# Patient Record
Sex: Female | Born: 1975 | Race: Black or African American | Hispanic: No | Marital: Married | Smoking: Never smoker
Health system: Southern US, Community
[De-identification: ages and names within clinical notes are randomized; demographics above are authoritative.]

## PROBLEM LIST (undated history)

## (undated) ENCOUNTER — Inpatient Hospital Stay (HOSPITAL_COMMUNITY): Payer: Self-pay

## (undated) DIAGNOSIS — R002 Palpitations: Secondary | ICD-10-CM

## (undated) DIAGNOSIS — E119 Type 2 diabetes mellitus without complications: Secondary | ICD-10-CM

## (undated) DIAGNOSIS — D649 Anemia, unspecified: Secondary | ICD-10-CM

## (undated) DIAGNOSIS — O24111 Pre-existing diabetes mellitus, type 2, in pregnancy, first trimester: Secondary | ICD-10-CM

## (undated) DIAGNOSIS — O139 Gestational [pregnancy-induced] hypertension without significant proteinuria, unspecified trimester: Secondary | ICD-10-CM

## (undated) DIAGNOSIS — O24419 Gestational diabetes mellitus in pregnancy, unspecified control: Secondary | ICD-10-CM

## (undated) DIAGNOSIS — E079 Disorder of thyroid, unspecified: Secondary | ICD-10-CM

## (undated) DIAGNOSIS — I10 Essential (primary) hypertension: Secondary | ICD-10-CM

## (undated) HISTORY — DX: Essential (primary) hypertension: I10

## (undated) HISTORY — DX: Palpitations: R00.2

## (undated) HISTORY — DX: Anemia, unspecified: D64.9

## (undated) HISTORY — PX: THYROIDECTOMY: SHX17

## (undated) HISTORY — PX: APPENDECTOMY: SHX54

---
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
## 2010-02-22 ENCOUNTER — Ambulatory Visit (HOSPITAL_COMMUNITY): Admission: RE | Admit: 2010-02-22 | Discharge: 2010-02-22 | Payer: Self-pay | Admitting: Obstetrics

## 2010-03-19 ENCOUNTER — Encounter: Payer: Self-pay | Admitting: Obstetrics

## 2010-03-28 ENCOUNTER — Ambulatory Visit (HOSPITAL_COMMUNITY): Admission: RE | Admit: 2010-03-28 | Discharge: 2010-03-28 | Payer: Self-pay | Admitting: Obstetrics

## 2010-04-14 ENCOUNTER — Inpatient Hospital Stay (HOSPITAL_COMMUNITY): Admission: AD | Admit: 2010-04-14 | Discharge: 2010-04-15 | Payer: Self-pay | Admitting: Obstetrics

## 2010-04-14 ENCOUNTER — Ambulatory Visit: Payer: Self-pay | Admitting: Obstetrics and Gynecology

## 2010-04-18 ENCOUNTER — Ambulatory Visit (HOSPITAL_COMMUNITY): Admission: RE | Admit: 2010-04-18 | Discharge: 2010-04-18 | Payer: Self-pay | Admitting: Obstetrics

## 2010-04-19 ENCOUNTER — Ambulatory Visit (HOSPITAL_COMMUNITY): Admission: RE | Admit: 2010-04-19 | Discharge: 2010-04-19 | Payer: Self-pay | Admitting: Obstetrics

## 2010-04-25 ENCOUNTER — Ambulatory Visit (HOSPITAL_COMMUNITY): Admission: RE | Admit: 2010-04-25 | Discharge: 2010-04-25 | Payer: Self-pay | Admitting: Obstetrics

## 2010-04-26 ENCOUNTER — Ambulatory Visit: Payer: Self-pay | Admitting: Obstetrics and Gynecology

## 2010-04-26 ENCOUNTER — Inpatient Hospital Stay (HOSPITAL_COMMUNITY): Admission: AD | Admit: 2010-04-26 | Discharge: 2010-04-26 | Payer: Self-pay | Admitting: Obstetrics

## 2010-04-29 ENCOUNTER — Ambulatory Visit (HOSPITAL_COMMUNITY): Admission: RE | Admit: 2010-04-29 | Discharge: 2010-04-29 | Payer: Self-pay | Admitting: Obstetrics

## 2010-05-02 ENCOUNTER — Ambulatory Visit (HOSPITAL_COMMUNITY): Admission: RE | Admit: 2010-05-02 | Discharge: 2010-05-02 | Payer: Self-pay | Admitting: Obstetrics

## 2010-05-06 ENCOUNTER — Ambulatory Visit: Payer: Self-pay | Admitting: Nurse Practitioner

## 2010-05-06 ENCOUNTER — Encounter: Payer: Self-pay | Admitting: Obstetrics

## 2010-05-06 ENCOUNTER — Inpatient Hospital Stay (HOSPITAL_COMMUNITY): Admission: AD | Admit: 2010-05-06 | Discharge: 2010-05-06 | Payer: Self-pay | Admitting: Obstetrics

## 2010-05-09 ENCOUNTER — Ambulatory Visit (HOSPITAL_COMMUNITY): Admission: RE | Admit: 2010-05-09 | Discharge: 2010-05-09 | Payer: Self-pay | Admitting: Obstetrics

## 2010-05-13 ENCOUNTER — Ambulatory Visit (HOSPITAL_COMMUNITY): Admission: RE | Admit: 2010-05-13 | Discharge: 2010-05-13 | Payer: Self-pay | Admitting: Obstetrics

## 2010-05-16 ENCOUNTER — Ambulatory Visit (HOSPITAL_COMMUNITY): Admission: RE | Admit: 2010-05-16 | Discharge: 2010-05-16 | Payer: Self-pay | Admitting: Obstetrics

## 2010-05-20 ENCOUNTER — Ambulatory Visit (HOSPITAL_COMMUNITY): Admission: RE | Admit: 2010-05-20 | Discharge: 2010-05-20 | Payer: Self-pay | Admitting: Obstetrics

## 2010-05-23 ENCOUNTER — Inpatient Hospital Stay (HOSPITAL_COMMUNITY): Admission: AD | Admit: 2010-05-23 | Discharge: 2010-05-23 | Payer: Self-pay | Admitting: Obstetrics

## 2010-05-23 ENCOUNTER — Ambulatory Visit (HOSPITAL_COMMUNITY): Admission: RE | Admit: 2010-05-23 | Discharge: 2010-05-23 | Payer: Self-pay | Admitting: Obstetrics

## 2010-05-23 ENCOUNTER — Ambulatory Visit: Payer: Self-pay | Admitting: Obstetrics and Gynecology

## 2010-05-26 ENCOUNTER — Inpatient Hospital Stay (HOSPITAL_COMMUNITY): Admission: AD | Admit: 2010-05-26 | Discharge: 2010-05-26 | Payer: Self-pay | Admitting: Obstetrics

## 2010-05-26 ENCOUNTER — Ambulatory Visit: Payer: Self-pay | Admitting: Family

## 2010-05-27 ENCOUNTER — Ambulatory Visit (HOSPITAL_COMMUNITY): Admission: RE | Admit: 2010-05-27 | Discharge: 2010-05-27 | Payer: Self-pay | Admitting: Obstetrics

## 2010-05-30 ENCOUNTER — Ambulatory Visit (HOSPITAL_COMMUNITY): Admission: RE | Admit: 2010-05-30 | Discharge: 2010-05-30 | Payer: Self-pay | Admitting: Obstetrics

## 2010-06-03 ENCOUNTER — Encounter: Payer: Self-pay | Admitting: Obstetrics

## 2010-06-03 ENCOUNTER — Inpatient Hospital Stay (HOSPITAL_COMMUNITY): Admission: AD | Admit: 2010-06-03 | Discharge: 2010-06-04 | Payer: Self-pay | Admitting: Obstetrics

## 2010-06-06 ENCOUNTER — Inpatient Hospital Stay (HOSPITAL_COMMUNITY): Admission: AD | Admit: 2010-06-06 | Discharge: 2010-06-09 | Payer: Self-pay | Admitting: Obstetrics

## 2010-06-06 ENCOUNTER — Encounter: Payer: Self-pay | Admitting: Obstetrics

## 2010-06-27 ENCOUNTER — Inpatient Hospital Stay (HOSPITAL_COMMUNITY): Admission: AD | Admit: 2010-06-27 | Discharge: 2010-06-27 | Payer: Self-pay | Admitting: Obstetrics

## 2010-06-27 ENCOUNTER — Ambulatory Visit: Payer: Self-pay | Admitting: Obstetrics and Gynecology

## 2010-09-26 ENCOUNTER — Inpatient Hospital Stay (HOSPITAL_COMMUNITY): Admission: AD | Admit: 2010-09-26 | Discharge: 2010-03-20 | Payer: Self-pay | Admitting: Obstetrics

## 2010-11-10 ENCOUNTER — Encounter: Payer: Self-pay | Admitting: Obstetrics

## 2011-01-03 LAB — COMPREHENSIVE METABOLIC PANEL
ALT: 11 U/L (ref 0–35)
ALT: 12 U/L (ref 0–35)
Albumin: 2.8 g/dL — ABNORMAL LOW (ref 3.5–5.2)
Alkaline Phosphatase: 197 U/L — ABNORMAL HIGH (ref 39–117)
Alkaline Phosphatase: 207 U/L — ABNORMAL HIGH (ref 39–117)
BUN: 2 mg/dL — ABNORMAL LOW (ref 6–23)
CO2: 23 mEq/L (ref 19–32)
Calcium: 10.2 mg/dL (ref 8.4–10.5)
Calcium: 10.3 mg/dL (ref 8.4–10.5)
Chloride: 105 mEq/L (ref 96–112)
Creatinine, Ser: 0.47 mg/dL (ref 0.4–1.2)
Creatinine, Ser: 0.43 mg/dL (ref 0.4–1.2)
GFR calc Af Amer: >60 mL/min (ref >60)
GFR calc non Af Amer: >60 mL/min (ref >60)
GFR calc non Af Amer: >60 mL/min (ref >60)
Sodium: 133 mEq/L — ABNORMAL LOW (ref 135–145)
Total Bilirubin: 0.7 mg/dL (ref 0.3–1.2)
Total Protein: 6.1 g/dL (ref 6.0–8.3)
Total Protein: 6.3 g/dL (ref 6.0–8.3)
Total Protein: 6.3 g/dL (ref 6.0–8.3)

## 2011-01-03 LAB — CBC
HCT: 28.3 % — ABNORMAL LOW (ref 36.0–46.0)
HCT: 34.7 % — ABNORMAL LOW (ref 36.0–46.0)
Hemoglobin: 10.8 g/dL — ABNORMAL LOW (ref 12.0–15.0)
Hemoglobin: 10.8 g/dL — ABNORMAL LOW (ref 12.0–15.0)
Hemoglobin: 8.9 g/dL — ABNORMAL LOW (ref 12.0–15.0)
MCH: 22.6 pg — ABNORMAL LOW (ref 26.0–34.0)
MCH: 23 pg — ABNORMAL LOW (ref 26.0–34.0)
MCH: 22.8 pg — ABNORMAL LOW (ref 26.0–34.0)
MCHC: 30.6 g/dL (ref 30.0–36.0)
MCHC: 31.4 g/dL (ref 30.0–36.0)
MCV: 73.7 fL — ABNORMAL LOW (ref 78.0–100.0)
MCV: 73 fL — ABNORMAL LOW (ref 78.0–100.0)
Platelets: 139 10*3/uL — ABNORMAL LOW (ref 150–400)
Platelets: 103 10*3/uL — ABNORMAL LOW (ref 150–400)
RBC: 3.86 MIL/uL — ABNORMAL LOW (ref 3.87–5.11)
RBC: 4.77 MIL/uL (ref 3.87–5.11)
RDW: 17.4 % — ABNORMAL HIGH (ref 11.5–15.5)
RDW: 17.4 % — ABNORMAL HIGH (ref 11.5–15.5)
WBC: 8.1 10*3/uL (ref 4.0–10.5)

## 2011-01-03 LAB — URINALYSIS, ROUTINE W REFLEX MICROSCOPIC
Bilirubin Urine: NEGATIVE
Bilirubin Urine: NEGATIVE
Glucose, UA: 100 mg/dL — AB
Hgb urine dipstick: NEGATIVE
Hgb urine dipstick: NEGATIVE
Nitrite: NEGATIVE
Protein, ur: NEGATIVE mg/dL
Urobilinogen, UA: 0.2 mg/dL (ref 0.0–1.0)
Urobilinogen, UA: 0.2 mg/dL (ref 0.0–1.0)
pH: 6 (ref 5.0–8.0)

## 2011-01-03 LAB — GLUCOSE, CAPILLARY
Glucose-Capillary: 105 mg/dL — ABNORMAL HIGH (ref 70–99)
Glucose-Capillary: 121 mg/dL — ABNORMAL HIGH (ref 70–99)
Glucose-Capillary: 121 mg/dL — ABNORMAL HIGH (ref 70–99)
Glucose-Capillary: 151 mg/dL — ABNORMAL HIGH (ref 70–99)

## 2011-01-03 LAB — PROTEIN, URINE, 24 HOUR
Collection Interval-UPROT: 24 hours
Protein, 24H Urine: 222 mg/d — ABNORMAL HIGH (ref 50–100)
Urine Total Volume-UPROT: 3700 mL

## 2011-01-03 LAB — CREATININE CLEARANCE, URINE, 24 HOUR
Collection Interval-CRCL: 24 hours
Creatinine Clearance: 220 mL/min — ABNORMAL HIGH (ref 75–115)
Creatinine: 0.47 mg/dL (ref 0.4–1.2)
Urine Total Volume-CRCL: 3700 mL

## 2011-01-03 LAB — URIC ACID: Uric Acid, Serum: 4.2 mg/dL (ref 2.4–7.0)

## 2011-01-04 LAB — URINALYSIS, ROUTINE W REFLEX MICROSCOPIC
Ketones, ur: 15 mg/dL — AB
Nitrite: NEGATIVE
Specific Gravity, Urine: >1.03 — ABNORMAL HIGH (ref 1.005–1.030)
pH: 6 (ref 5.0–8.0)

## 2011-01-05 LAB — URINALYSIS, ROUTINE W REFLEX MICROSCOPIC
Bilirubin Urine: NEGATIVE
Glucose, UA: 250 mg/dL — AB
Nitrite: NEGATIVE
Nitrite: NEGATIVE
Protein, ur: NEGATIVE mg/dL
Specific Gravity, Urine: >1.03 — ABNORMAL HIGH (ref 1.005–1.030)
Specific Gravity, Urine: >1.03 — ABNORMAL HIGH (ref 1.005–1.030)
Urobilinogen, UA: 1 mg/dL (ref 0.0–1.0)
pH: 6 (ref 5.0–8.0)

## 2011-01-05 LAB — COMPREHENSIVE METABOLIC PANEL
ALT: 11 U/L (ref 0–35)
AST: 15 U/L (ref 0–37)
AST: 15 U/L (ref 0–37)
Albumin: 2.9 g/dL — ABNORMAL LOW (ref 3.5–5.2)
Alkaline Phosphatase: 122 U/L — ABNORMAL HIGH (ref 39–117)
CO2: 23 mEq/L (ref 19–32)
Calcium: 10.4 mg/dL (ref 8.4–10.5)
Calcium: 10.6 mg/dL — ABNORMAL HIGH (ref 8.4–10.5)
Chloride: 105 mEq/L (ref 96–112)
Creatinine, Ser: 0.39 mg/dL — ABNORMAL LOW (ref 0.4–1.2)
GFR calc Af Amer: >60 mL/min (ref >60)
GFR calc Af Amer: >60 mL/min (ref >60)
GFR calc non Af Amer: >60 mL/min (ref >60)
Glucose, Bld: 99 mg/dL (ref 70–99)
Potassium: 3.5 mEq/L (ref 3.5–5.1)
Sodium: 134 mEq/L — ABNORMAL LOW (ref 135–145)
Total Bilirubin: 0.7 mg/dL (ref 0.3–1.2)
Total Protein: 6.3 g/dL (ref 6.0–8.3)
Total Protein: 6.5 g/dL (ref 6.0–8.3)

## 2011-01-05 LAB — URIC ACID: Uric Acid, Serum: 4 mg/dL (ref 2.4–7.0)

## 2011-01-05 LAB — CBC
HCT: 34 % — ABNORMAL LOW (ref 36.0–46.0)
Hemoglobin: 10.5 g/dL — ABNORMAL LOW (ref 12.0–15.0)
MCH: 22.8 pg — ABNORMAL LOW (ref 26.0–34.0)
Platelets: 102 10*3/uL — ABNORMAL LOW (ref 150–400)
RBC: 4.51 MIL/uL (ref 3.87–5.11)
RDW: 19 % — ABNORMAL HIGH (ref 11.5–15.5)
WBC: 8.9 10*3/uL (ref 4.0–10.5)

## 2011-01-05 LAB — LACTATE DEHYDROGENASE: LDH: 123 U/L (ref 94–250)

## 2011-01-06 LAB — COMPREHENSIVE METABOLIC PANEL
ALT: 13 U/L (ref 0–35)
Alkaline Phosphatase: 78 U/L (ref 39–117)
BUN: 2 mg/dL — ABNORMAL LOW (ref 6–23)
BUN: 4 mg/dL — ABNORMAL LOW (ref 6–23)
CO2: 23 mEq/L (ref 19–32)
CO2: 24 mEq/L (ref 19–32)
Calcium: 10.1 mg/dL (ref 8.4–10.5)
Chloride: 108 mEq/L (ref 96–112)
Creatinine, Ser: 0.41 mg/dL (ref 0.4–1.2)
GFR calc non Af Amer: >60 mL/min (ref >60)
Glucose, Bld: 105 mg/dL — ABNORMAL HIGH (ref 70–99)
Glucose, Bld: 148 mg/dL — ABNORMAL HIGH (ref 70–99)
Potassium: 3.3 mEq/L — ABNORMAL LOW (ref 3.5–5.1)
Total Bilirubin: 0.3 mg/dL (ref 0.3–1.2)
Total Protein: 6.6 g/dL (ref 6.0–8.3)

## 2011-01-06 LAB — CBC
HCT: 30.9 % — ABNORMAL LOW (ref 36.0–46.0)
HCT: 34.4 % — ABNORMAL LOW (ref 36.0–46.0)
Hemoglobin: 10.8 g/dL — ABNORMAL LOW (ref 12.0–15.0)
Hemoglobin: 9.9 g/dL — ABNORMAL LOW (ref 12.0–15.0)
MCHC: 31.4 g/dL (ref 30.0–36.0)
MCV: 68.5 fL — ABNORMAL LOW (ref 78.0–100.0)
RBC: 5.03 MIL/uL (ref 3.87–5.11)
RDW: 23.6 % — ABNORMAL HIGH (ref 11.5–15.5)
WBC: 7.4 10*3/uL (ref 4.0–10.5)

## 2011-01-06 LAB — URINALYSIS, ROUTINE W REFLEX MICROSCOPIC
Bilirubin Urine: NEGATIVE
Hgb urine dipstick: NEGATIVE
Ketones, ur: NEGATIVE mg/dL
Specific Gravity, Urine: 1.025 (ref 1.005–1.030)
pH: 5.5 (ref 5.0–8.0)

## 2011-01-06 LAB — PROTEIN, URINE, 24 HOUR
Protein, 24H Urine: 189 mg/d — ABNORMAL HIGH (ref 50–100)
Urine Total Volume-UPROT: 2700 mL

## 2011-01-06 LAB — URIC ACID
Uric Acid, Serum: 3.4 mg/dL (ref 2.4–7.0)
Uric Acid, Serum: 3.4 mg/dL (ref 2.4–7.0)

## 2011-01-06 LAB — CREATININE CLEARANCE, URINE, 24 HOUR
Collection Interval-CRCL: 24 hours
Creatinine Clearance: 158 mL/min — ABNORMAL HIGH (ref 75–115)
Creatinine, 24H Ur: 1161 mg/d (ref 700–1800)
Creatinine, Urine: 43 mg/dL

## 2011-01-06 LAB — LACTATE DEHYDROGENASE
LDH: 85 U/L — ABNORMAL LOW (ref 94–250)
LDH: 99 U/L (ref 94–250)

## 2011-07-07 ENCOUNTER — Encounter (HOSPITAL_COMMUNITY): Payer: Self-pay | Admitting: *Deleted

## 2011-07-07 ENCOUNTER — Inpatient Hospital Stay (HOSPITAL_COMMUNITY)
Admission: AD | Admit: 2011-07-07 | Discharge: 2011-07-07 | Disposition: A | Discharge status: Home / Self Care | Payer: Self-pay | Source: Ambulatory Visit | Attending: Obstetrics & Gynecology | Admitting: Obstetrics & Gynecology

## 2011-07-07 ENCOUNTER — Inpatient Hospital Stay (HOSPITAL_COMMUNITY): Payer: Self-pay

## 2011-07-07 DIAGNOSIS — O209 Hemorrhage in early pregnancy, unspecified: Secondary | ICD-10-CM | POA: Insufficient documentation

## 2011-07-07 DIAGNOSIS — O200 Threatened abortion: Secondary | ICD-10-CM

## 2011-07-07 HISTORY — DX: Gestational (pregnancy-induced) hypertension without significant proteinuria, unspecified trimester: O13.9

## 2011-07-07 HISTORY — DX: Gestational diabetes mellitus in pregnancy, unspecified control: O24.419

## 2011-07-07 LAB — URINE MICROSCOPIC-ADD ON

## 2011-07-07 LAB — POCT PREGNANCY, URINE: Preg Test, Ur: POSITIVE

## 2011-07-07 LAB — URINALYSIS, ROUTINE W REFLEX MICROSCOPIC
Bilirubin Urine: NEGATIVE
Ketones, ur: NEGATIVE mg/dL
Nitrite: NEGATIVE
Urobilinogen, UA: 0.2 mg/dL (ref 0.0–1.0)

## 2011-07-07 NOTE — Progress Notes (Signed)
Pt reports last period 05/15/11 had positive HPT in august. Started having back pain and vaginal bleeding. Has not started prenatal care.

## 2011-07-07 NOTE — ED Provider Notes (Signed)
Chief Complaint:  Threatened Miscarriage   Kylie Baker is  35 y.o. G2P1001 at Unknown presents complaining of Threatened Miscarriage . Pt states she began having moderate vaginal bleeding and lower back pain about 3 hours ago.  Last intercourse 2 days ago.  Has not started [facility] and has not had an u/s this pregnancy Obstetrical/Gynecological History: Menstrual History: OB History    Grav Para Term Preterm Abortions TAB SAB Ect Mult Living   2 1 1  0 0 0 0 0 0 1     Patient's last menstrual period was 05/16/2011.     Past Medical History: Past Medical History  Diagnosis Date  . Gestational diabetes   . Pregnancy induced hypertension   . Asthma     Past Surgical History: Past Surgical History  Procedure Date  . Cesarean section   . Appendectomy     Family History: No family history on file.  Social History: History  Substance Use Topics  . Smoking status: Never Smoker   . Smokeless tobacco: Never Used  . Alcohol Use: No    Allergies:  Allergies  Allergen Reactions  . Aspirin Itching    Meds:  Prescriptions prior to admission  Medication Sig Dispense Refill  . amLODipine (NORVASC) 10 MG tablet Take 10 mg by mouth daily.        . prenatal vitamin w/FE, FA (PRENATAL 1 + 1) 27-1 MG TABS Take 1 tablet by mouth daily.          Review of Systems - Please refer to the aforementioned patients' reports.    Physical Exam  Blood pressure 135/96, pulse 85, temperature 98.9 F (37.2 C), temperature source Oral, resp. rate 18, height 5\' 2"  (1.575 m), weight 239 lb 9.6 oz (108.682 kg), last menstrual period 05/16/2011, unknown if currently breastfeeding. GENERAL: Well-developed, well-nourished female in no acute distress.  LUNGS: Clear to auscultation bilaterally.  HEART: Regular rate and rhythm. ABDOMEN: Soft, nontender, nondistended, gravid.  EXTREMITIES: Nontender, no edema, 2+ distal pulses. PELVIC:  SSE;  Moderate amount of bright red blood coming from cervical  os.  Cx non-friable, closed  Labs: No results found for this or any previous visit (from the past 24 hour(s)). Imaging Studies:  No results found.  Assessment: Kylie Baker is  35 y.o. G2P1001 at Unknown presents with vaginal bleeding/back pain  Plan: [facility]; ultrasound  CRESENZO-DISHMAN,Daily Crate 9/17/20123:28 PM

## 2011-07-07 NOTE — ED Notes (Signed)
Drenda Freeze CNM in with pt, discussing [place] results and plan.

## 2011-07-07 NOTE — Progress Notes (Signed)
Started bleeding around 1200, back pain started prior to bleeding.  +HPT last week.

## 2011-07-07 NOTE — ED Provider Notes (Signed)
Pt's HCG is 117, u/s showed 4.3 week GS, no fetal pole.  Pt informed that this could represent a SAB or an early IUP.  QHCG ordered for 48 hours.

## 2011-07-11 NOTE — ED Provider Notes (Signed)
Agree with above note.  Anaisa Radi H. 07/11/2011 8:25 AM  

## 2011-07-11 NOTE — ED Provider Notes (Signed)
Agree with above note.  LEGGETT,KELLY H. 07/11/2011 8:25 AM

## 2011-09-12 ENCOUNTER — Inpatient Hospital Stay (HOSPITAL_COMMUNITY): Payer: Self-pay

## 2011-09-12 ENCOUNTER — Encounter (HOSPITAL_COMMUNITY): Payer: Self-pay

## 2011-09-12 ENCOUNTER — Inpatient Hospital Stay (HOSPITAL_COMMUNITY)
Admission: AD | Admit: 2011-09-12 | Discharge: 2011-09-12 | Disposition: A | Discharge status: Home / Self Care | Payer: Self-pay | Source: Ambulatory Visit | Attending: Obstetrics & Gynecology | Admitting: Obstetrics & Gynecology

## 2011-09-12 DIAGNOSIS — I10 Essential (primary) hypertension: Secondary | ICD-10-CM

## 2011-09-12 DIAGNOSIS — O4690 Antepartum hemorrhage, unspecified, unspecified trimester: Secondary | ICD-10-CM

## 2011-09-12 DIAGNOSIS — O209 Hemorrhage in early pregnancy, unspecified: Secondary | ICD-10-CM | POA: Insufficient documentation

## 2011-09-12 DIAGNOSIS — O219 Vomiting of pregnancy, unspecified: Secondary | ICD-10-CM

## 2011-09-12 DIAGNOSIS — O10019 Pre-existing essential hypertension complicating pregnancy, unspecified trimester: Secondary | ICD-10-CM | POA: Insufficient documentation

## 2011-09-12 DIAGNOSIS — O210 Mild hyperemesis gravidarum: Secondary | ICD-10-CM | POA: Insufficient documentation

## 2011-09-12 LAB — URINALYSIS, ROUTINE W REFLEX MICROSCOPIC
Ketones, ur: NEGATIVE mg/dL
Leukocytes, UA: NEGATIVE
Nitrite: NEGATIVE
Specific Gravity, Urine: 1.025 (ref 1.005–1.030)
Urobilinogen, UA: 0.2 mg/dL (ref 0.0–1.0)
pH: 6 (ref 5.0–8.0)

## 2011-09-12 MED ORDER — ONDANSETRON 8 MG PO TBDP: 8.0000 mg | ORAL_TABLET | Freq: Three times a day (TID) | ORAL | Status: AC | PRN

## 2011-09-12 MED ORDER — LACTATED RINGERS IV BOLUS (SEPSIS)
1000.0000 mL | Freq: Once | INTRAVENOUS | Status: AC
  Administered 2011-09-12: 1000 mL via INTRAVENOUS

## 2011-09-12 MED ORDER — ONDANSETRON 8 MG/NS 50 ML IVPB
8.0000 mg | Freq: Once | INTRAVENOUS | Status: AC
  Administered 2011-09-12: 8 mg via INTRAVENOUS
  Filled 2011-09-12: qty 8

## 2011-09-12 MED ORDER — LABETALOL HCL 200 MG PO TABS: 100.0000 mg | ORAL_TABLET | Freq: Two times a day (BID) | ORAL | Status: DC

## 2011-09-12 NOTE — Progress Notes (Signed)
Patient states that she has had nausea and vomiting for a while, worse for the past 2 days. Has some lower abdominal cramping off and on. Feels weak. Patient states she had a SAB on 9-17 and did not have a period.

## 2011-09-12 NOTE — ED Provider Notes (Signed)
History   Kylie Baker is a 35 y.o. year old G43P1011 female at Unknown weeks gestation who presents to MAU reporting not being able to keep anything x 2 days. She was pregnancy in September 2012, [place] 07/07/11 showed 4.3 week GS, no YS or FP. Quant 117. She reports bleeding x 1 week. Assumed SAB. No period since then.   Chief Complaint  Patient presents with  . Emesis   HPI  OB History    Grav Para Term Preterm Abortions TAB SAB Ect Mult Living   3 1 1  0 1 0 1 0 0 1      Past Medical History  Diagnosis Date  . Pregnancy induced hypertension   . Asthma   . Gestational diabetes     Past Surgical History  Procedure Date  . Cesarean section   . Appendectomy     No family history on file.  History  Substance Use Topics  . Smoking status: Never Smoker   . Smokeless tobacco: Never Used  . Alcohol Use: No    Allergies:  Allergies  Allergen Reactions  . Aspirin Itching    Prescriptions prior to admission  Medication Sig Dispense Refill  . prenatal vitamin w/FE, FA (PRENATAL 1 + 1) 27-1 MG TABS Take 1 tablet by mouth daily.         ROS: Otherwise neg Physical Exam   Blood pressure 155/84, pulse 83, temperature 99.2 F (37.3 C), temperature source Oral, resp. rate 20, height 5' 3.5" (1.613 m), weight 112.492 kg (248 lb), last menstrual period 05/16/2011, SpO2 98.00%, unknown if currently breastfeeding.  Physical Exam  MAU Course  Procedures  Ultrasound - Single living intrauterine gestation with estimated gestational age  of [redacted] weeks 4 days by this ultrasound.  No evidence of subchorionic hemorrhage.  Right ovary not visualized.  BHCG - [id number] Blood Type - B+ UA - negative  Assessment and Plan  Care turned over to [facility], [place] at 2000.  SMITH, [place] 09/12/2011, 8:00 PM   A:  Hyperemesis in pregnancy Bleeding in pregnancy Hypertension  Plan: DC to home Rx Zofran Labetalol 100mg  BID Keep scheduled appointment with  [facility]  DC to

## 2011-09-12 NOTE — Progress Notes (Signed)
Pt reports improvement in symptoms after receiving IV and meds.

## 2011-10-16 ENCOUNTER — Inpatient Hospital Stay (HOSPITAL_COMMUNITY)
Admission: AD | Admit: 2011-10-16 | Discharge: 2011-10-16 | Disposition: A | Discharge status: Home / Self Care | Payer: Self-pay | Source: Ambulatory Visit | Attending: Obstetrics | Admitting: Obstetrics

## 2011-10-16 ENCOUNTER — Encounter (HOSPITAL_COMMUNITY): Payer: Self-pay

## 2011-10-16 DIAGNOSIS — B9789 Other viral agents as the cause of diseases classified elsewhere: Secondary | ICD-10-CM | POA: Insufficient documentation

## 2011-10-16 DIAGNOSIS — B349 Viral infection, unspecified: Secondary | ICD-10-CM

## 2011-10-16 DIAGNOSIS — O99891 Other specified diseases and conditions complicating pregnancy: Secondary | ICD-10-CM | POA: Insufficient documentation

## 2011-10-16 LAB — URINALYSIS, ROUTINE W REFLEX MICROSCOPIC
Nitrite: NEGATIVE
Specific Gravity, Urine: >1.03 — ABNORMAL HIGH (ref 1.005–1.030)
Urobilinogen, UA: 0.2 mg/dL (ref 0.0–1.0)
pH: 6 (ref 5.0–8.0)

## 2011-10-16 MED ORDER — OSELTAMIVIR PHOSPHATE 75 MG PO CAPS: 75.0000 mg | ORAL_CAPSULE | Freq: Two times a day (BID) | ORAL | Status: AC

## 2011-10-16 NOTE — ED Provider Notes (Signed)
History     Chief Complaint  Patient presents with  . Influenza   HPI  Pt is here with report of body aches and shortness of breath that started on 10/13/11.  Also, fever (102)  and chills.  Denies nausea or vomiting.  No known exposure to ill individuals.    Past Medical History  Diagnosis Date  . Pregnancy induced hypertension   . Asthma   . Gestational diabetes     Past Surgical History  Procedure Date  . Cesarean section   . Appendectomy     No family history on file.  History  Substance Use Topics  . Smoking status: Never Smoker   . Smokeless tobacco: Never Used  . Alcohol Use: No    Allergies:  Allergies  Allergen Reactions  . Aspirin Itching    Prescriptions prior to admission  Medication Sig Dispense Refill  . labetalol (NORMODYNE) 200 MG tablet Take 0.5 tablets (100 mg total) by mouth 2 (two) times daily.  60 tablet  1  . Prenatal Vit-Fe Fumarate-FA (PRENATAL MULTIVITAMIN) TABS Take 1 tablet by mouth daily.          Review of Systems  Constitutional: Positive for fever, chills and malaise/fatigue.  HENT: Positive for ear pain, congestion and sore throat.   Eyes: Negative.   Respiratory: Positive for cough, sputum production and shortness of breath. Negative for wheezing.   Cardiovascular: Negative.   Gastrointestinal: Negative for nausea, vomiting and diarrhea.  Genitourinary: Negative.   Musculoskeletal: Positive for myalgias.  Neurological: Positive for headaches.   Physical Exam   Blood pressure 148/86, pulse 83, temperature 99.7 F (37.6 C), temperature source Oral, resp. rate 20, height 5\' 4"  (1.626 m), weight 109.952 kg (242 lb 6.4 oz), SpO2 98.00%, unknown if currently breastfeeding.  Physical Exam  Constitutional: She is oriented to person, place, and time. She appears well-developed and well-nourished. No distress.  HENT:  Head: Normocephalic.       Throat - reddened  Neck: Normal range of motion. Neck supple.  Cardiovascular:  Normal rate, regular rhythm and normal heart sounds.   Respiratory: Effort normal and breath sounds normal. No respiratory distress. She has no wheezes. She has no rales.       Pulse ox - 99%  GI: Soft. There is no tenderness.       Fetal heart rate + (ultrasound)  Genitourinary: No bleeding around the vagina.  Neurological: She is alert and oriented to person, place, and time.  Skin: Skin is warm and dry.    MAU Course  Procedures Flu screen  Consulted with Dr. Shari Heritage with plan of care Assessment and Plan  Viral Syndrome  Plan: DC to home Tamiflu Increase fluids Tylenol for pain Keep scheduled appointment.   [facility] 10/16/2011, 1:02 PM

## 2011-10-16 NOTE — Progress Notes (Signed)
Patient states she started getting sick on 12-24. Has had nasal congestion, runny nose, fever up to 102 this am that responds for a short time to Tylenol, sore throat, headache and back pain.

## 2011-10-16 NOTE — ED Notes (Signed)
C/o flu symptoms for past 4 days; 1st prenatal visit with Dr Gaynell Face  Is 10/27/11; denies anyone else being sick; unable to doppler FHR;

## 2011-10-16 NOTE — ED Notes (Signed)
States that she has to leave and she left 2 phone numbers to contact her;

## 2011-11-10 LAB — OB RESULTS CONSOLE ABO/RH: RH Type: POSITIVE

## 2011-11-10 LAB — OB RESULTS CONSOLE RPR: RPR: NONREACTIVE

## 2011-11-10 LAB — OB RESULTS CONSOLE HEPATITIS B SURFACE ANTIGEN: Hepatitis B Surface Ag: NEGATIVE

## 2011-11-10 LAB — OB RESULTS CONSOLE ANTIBODY SCREEN: Antibody Screen: NEGATIVE

## 2011-11-18 ENCOUNTER — Inpatient Hospital Stay (HOSPITAL_COMMUNITY)
Admission: AD | Admit: 2011-11-18 | Discharge: 2011-11-18 | Disposition: A | Discharge status: Home / Self Care | Payer: Self-pay | Source: Ambulatory Visit | Attending: Obstetrics | Admitting: Obstetrics

## 2011-11-18 ENCOUNTER — Inpatient Hospital Stay (HOSPITAL_COMMUNITY): Payer: Self-pay

## 2011-11-18 ENCOUNTER — Encounter (HOSPITAL_COMMUNITY): Payer: Self-pay | Admitting: *Deleted

## 2011-11-18 DIAGNOSIS — M549 Dorsalgia, unspecified: Secondary | ICD-10-CM | POA: Insufficient documentation

## 2011-11-18 DIAGNOSIS — N949 Unspecified condition associated with female genital organs and menstrual cycle: Secondary | ICD-10-CM

## 2011-11-18 DIAGNOSIS — O99891 Other specified diseases and conditions complicating pregnancy: Secondary | ICD-10-CM | POA: Insufficient documentation

## 2011-11-18 DIAGNOSIS — R109 Unspecified abdominal pain: Secondary | ICD-10-CM | POA: Insufficient documentation

## 2011-11-18 LAB — CBC
HCT: 35.1 % — ABNORMAL LOW (ref 36.0–46.0)
Hemoglobin: 10.6 g/dL — ABNORMAL LOW (ref 12.0–15.0)
MCH: 22.1 pg — ABNORMAL LOW (ref 26.0–34.0)
MCHC: 30.2 g/dL (ref 30.0–36.0)
MCV: 73.1 fL — ABNORMAL LOW (ref 78.0–100.0)
RBC: 4.8 MIL/uL (ref 3.87–5.11)

## 2011-11-18 LAB — DIFFERENTIAL
Basophils Relative: 0 % (ref 0–1)
Eosinophils Absolute: 0.2 10*3/uL (ref 0.0–0.7)
Eosinophils Relative: 2 % (ref 0–5)
Monocytes Absolute: 0.7 10*3/uL (ref 0.1–1.0)
Neutro Abs: 5 10*3/uL (ref 1.7–7.7)
Neutrophils Relative %: 62 % (ref 43–77)

## 2011-11-18 LAB — WET PREP, GENITAL: Clue Cells Wet Prep HPF POC: NONE SEEN

## 2011-11-18 LAB — URINALYSIS, ROUTINE W REFLEX MICROSCOPIC
Hgb urine dipstick: NEGATIVE
Leukocytes, UA: NEGATIVE
Nitrite: NEGATIVE
Protein, ur: NEGATIVE mg/dL
Specific Gravity, Urine: 1.02 (ref 1.005–1.030)
Urobilinogen, UA: 0.2 mg/dL (ref 0.0–1.0)

## 2011-11-18 NOTE — Progress Notes (Signed)
Back started for a long time, with preg.  abd pain started yesterday- cramping.

## 2011-11-18 NOTE — Progress Notes (Signed)
Pt states notes lower abd pain medially since last pm, intermittent. Also notes left sided low back pain, +CVA tenderness left sided only. Denies bleeding or vaginal d/c changes.

## 2011-11-18 NOTE — ED Provider Notes (Addendum)
History     Chief Complaint  Patient presents with  . Back Pain  . Abdominal Pain   HPI Kylie Baker is 36 y.o. G3P1011 [redacted]w[redacted]d weeks presenting with back pain and abdominal cramping.  Patient of Dr. Elsie Stain, has appt next week.  Pain worsened yesterday, holding both lower abdominal sides.  Unable to walk at the time of the pain, caused her to cry.  Less today but continues--intermittentely.  Denies vaginal bleeding.     Past Medical History  Diagnosis Date  . Pregnancy induced hypertension   . Asthma   . Gestational diabetes     Past Surgical History  Procedure Date  . Cesarean section   . Appendectomy     Family History  Problem Relation Age of Onset  . Anesthesia problems Neg Hx   . Hypertension Mother   . Diabetes Father   . Asthma Father     History  Substance Use Topics  . Smoking status: Never Smoker   . Smokeless tobacco: Never Used  . Alcohol Use: No    Allergies:  Allergies  Allergen Reactions  . Aspirin Itching    Prescriptions prior to admission  Medication Sig Dispense Refill  . labetalol (NORMODYNE) 200 MG tablet Take 0.5 tablets (100 mg total) by mouth 2 (two) times daily.  60 tablet  1  . Prenatal Vit-Fe Fumarate-FA (PRENATAL MULTIVITAMIN) TABS Take 1 tablet by mouth daily.          Review of Systems  Constitutional: Positive for malaise/fatigue. Negative for fever and chills.  HENT: Negative.   Eyes: Negative.   Respiratory: Negative for cough and wheezing.   Cardiovascular: Negative.   Gastrointestinal: Positive for nausea. Negative for vomiting, diarrhea and constipation.  Genitourinary: Negative for dysuria, urgency and frequency.  Musculoskeletal: Positive for myalgias and back pain.  Skin: Negative.   Neurological: Negative for dizziness.       Headaches  Psychiatric/Behavioral: Negative for depression. The patient is not nervous/anxious.    Physical Exam   Blood pressure 144/101, pulse 96, unknown if currently  breastfeeding.  Physical Exam  Constitutional: She appears well-developed and well-nourished. No distress.  HENT:  Head: Normocephalic.  Eyes: EOM are normal.  Neck: Neck supple.  Cardiovascular: Normal rate.   Respiratory: Effort normal.  Psychiatric: She has a normal mood and affect. Her behavior is normal. Judgment and thought content normal.    MAU Course  Procedures  MDM Care turned over to H. Damian Leavell, FNP    Jeani Sow M 11/18/2011, 4:04 PM   Matt Holmes, NP 11/18/11 1611 Assumed care from Jeani Sow, NP awaiting urine results.   Matt Holmes, NP 11/18/11 1619 Results for orders placed during the hospital encounter of 11/18/11 (from the past 24 hour(s))  URINALYSIS, ROUTINE W REFLEX MICROSCOPIC     Status: Normal   Collection Time   11/18/11  3:35 PM      Component Value Range   Color, Urine YELLOW  YELLOW    APPearance CLEAR  CLEAR    Specific Gravity, Urine 1.020  1.005 - 1.030    pH 6.5  5.0 - 8.0    Glucose, UA NEGATIVE  NEGATIVE (mg/dL)   Hgb urine dipstick NEGATIVE  NEGATIVE    Bilirubin Urine NEGATIVE  NEGATIVE    Ketones, ur NEGATIVE  NEGATIVE (mg/dL)   Protein, ur NEGATIVE  NEGATIVE (mg/dL)   Urobilinogen, UA 0.2  0.0 - 1.0 (mg/dL)   Nitrite NEGATIVE  NEGATIVE    Leukocytes, UA NEGATIVE  NEGATIVE   WET PREP, GENITAL     Status: Abnormal   Collection Time   11/18/11  4:20 PM      Component Value Range   Yeast Wet Prep HPF POC NONE SEEN  NONE SEEN    Trich, Wet Prep NONE SEEN  NONE SEEN    Clue Cells Wet Prep HPF POC NONE SEEN  NONE SEEN    WBC, Wet Prep HPF POC FEW (*) NONE SEEN   CBC     Status: Abnormal   Collection Time   11/18/11  4:40 PM      Component Value Range   WBC 8.2  4.0 - 10.5 (K/uL)   RBC 4.80  3.87 - 5.11 (MIL/uL)   Hemoglobin 10.6 (*) 12.0 - 15.0 (g/dL)   HCT 40.9 (*) 81.1 - 46.0 (%)   MCV 73.1 (*) 78.0 - 100.0 (fL)   MCH 22.1 (*) 26.0 - 34.0 (pg)   MCHC 30.2  30.0 - 36.0 (g/dL)   RDW 91.4 (*) 78.2 - 15.5 (%)   Platelets 137 (*) 150 -  400 (K/uL)  DIFFERENTIAL     Status: Normal   Collection Time   11/18/11  4:40 PM      Component Value Range   Neutrophils Relative 62  43 - 77 (%)   Lymphocytes Relative 28  12 - 46 (%)   Monocytes Relative 8  3 - 12 (%)   Eosinophils Relative 2  0 - 5 (%)   Basophils Relative 0  0 - 1 (%)   Neutro Abs 5.0  1.7 - 7.7 (K/uL)   Lymphs Abs 2.3  0.7 - 4.0 (K/uL)   Monocytes Absolute 0.7  0.1 - 1.0 (K/uL)   Eosinophils Absolute 0.2  0.0 - 0.7 (K/uL)   Basophils Absolute 0.0  0.0 - 0.1 (K/uL)   Abdominal exam: Soft, gravid, mildly tender bilateral lower quadrants. No CVA tenderness.  Pelvic exam: External genitalia without lesions, Cervix closed and thick. No bleeding noted. Uterus approximately 18 - 20 week size. Cultures for GC and Chlamydia collected.  Patient returned from ultrasound, states she needs to leave as soon as possible. Ultrasound shows a 18 week 1 day IUP with cardiac activity 149. Placenta is above the cervical os. Normal fluid. Normal ultrasound Discussed in detail with patient. Discussed round ligament pain and use of maternity girdle. Patient states she had same with pervious pregnancy and it helped. Will also take tylenol for discomfort and follow up with Dr. Gaynell Face as scheduled for 11/25/11. She will return here as needed.    [place], [place] 11/18/11 Windell Moment

## 2011-11-19 LAB — GC/CHLAMYDIA PROBE AMP, GENITAL
Chlamydia, DNA Probe: NEGATIVE
GC Probe Amp, Genital: NEGATIVE

## 2011-12-11 ENCOUNTER — Other Ambulatory Visit (HOSPITAL_COMMUNITY): Payer: Self-pay | Admitting: Obstetrics

## 2011-12-16 ENCOUNTER — Ambulatory Visit (HOSPITAL_COMMUNITY)
Admission: RE | Admit: 2011-12-16 | Discharge: 2011-12-16 | Disposition: A | Discharge status: Home / Self Care | Payer: Self-pay | Source: Ambulatory Visit | Attending: Obstetrics | Admitting: Obstetrics

## 2011-12-16 ENCOUNTER — Other Ambulatory Visit: Payer: Self-pay | Admitting: Obstetrics

## 2011-12-16 DIAGNOSIS — O34219 Maternal care for unspecified type scar from previous cesarean delivery: Secondary | ICD-10-CM | POA: Insufficient documentation

## 2011-12-16 DIAGNOSIS — O10019 Pre-existing essential hypertension complicating pregnancy, unspecified trimester: Secondary | ICD-10-CM | POA: Insufficient documentation

## 2011-12-16 DIAGNOSIS — Z1389 Encounter for screening for other disorder: Secondary | ICD-10-CM | POA: Insufficient documentation

## 2011-12-16 DIAGNOSIS — O358XX0 Maternal care for other (suspected) fetal abnormality and damage, not applicable or unspecified: Secondary | ICD-10-CM | POA: Insufficient documentation

## 2011-12-16 DIAGNOSIS — Z363 Encounter for antenatal screening for malformations: Secondary | ICD-10-CM | POA: Insufficient documentation

## 2011-12-16 DIAGNOSIS — O09529 Supervision of elderly multigravida, unspecified trimester: Secondary | ICD-10-CM | POA: Insufficient documentation

## 2011-12-16 DIAGNOSIS — O09299 Supervision of pregnancy with other poor reproductive or obstetric history, unspecified trimester: Secondary | ICD-10-CM | POA: Insufficient documentation

## 2011-12-16 DIAGNOSIS — E669 Obesity, unspecified: Secondary | ICD-10-CM | POA: Insufficient documentation

## 2011-12-16 DIAGNOSIS — O99210 Obesity complicating pregnancy, unspecified trimester: Secondary | ICD-10-CM | POA: Insufficient documentation

## 2012-01-13 ENCOUNTER — Ambulatory Visit (HOSPITAL_COMMUNITY)
Admission: RE | Admit: 2012-01-13 | Discharge: 2012-01-13 | Disposition: A | Discharge status: Home / Self Care | Payer: Self-pay | Source: Ambulatory Visit | Attending: Obstetrics | Admitting: Obstetrics

## 2012-01-13 VITALS — BP 147/92 | HR 97 | Wt 258.0 lb

## 2012-01-13 DIAGNOSIS — O99210 Obesity complicating pregnancy, unspecified trimester: Secondary | ICD-10-CM | POA: Insufficient documentation

## 2012-01-13 DIAGNOSIS — O34219 Maternal care for unspecified type scar from previous cesarean delivery: Secondary | ICD-10-CM | POA: Insufficient documentation

## 2012-01-13 DIAGNOSIS — O10019 Pre-existing essential hypertension complicating pregnancy, unspecified trimester: Secondary | ICD-10-CM | POA: Insufficient documentation

## 2012-01-13 DIAGNOSIS — O358XX0 Maternal care for other (suspected) fetal abnormality and damage, not applicable or unspecified: Secondary | ICD-10-CM | POA: Insufficient documentation

## 2012-01-13 DIAGNOSIS — O09529 Supervision of elderly multigravida, unspecified trimester: Secondary | ICD-10-CM | POA: Insufficient documentation

## 2012-01-13 DIAGNOSIS — E669 Obesity, unspecified: Secondary | ICD-10-CM | POA: Insufficient documentation

## 2012-01-13 NOTE — Progress Notes (Signed)
Obstetric ultrasound performed today.   IUP at 26 weeks 1 day Resolved pyelectasis  Cerebral ventricles at the upper limits of normal Normal amniotic fluid volume  Appropriate interval growth  Patient's BP today was 150/76 and 147/92.  She denies any symptoms consistent with preeclampsia and states her labetolol was recently increased from 200mg  BID to TID.  She also  reports turning in a 24 hour urine collection yesterday.  Concerning signs and symptoms reviewed and precautions given.    Follow up ultrasound scheduled in 3 weeks.

## 2012-01-25 ENCOUNTER — Encounter (HOSPITAL_COMMUNITY): Payer: Self-pay | Admitting: Advanced Practice Midwife

## 2012-01-25 ENCOUNTER — Inpatient Hospital Stay (HOSPITAL_COMMUNITY)
Admission: AD | Admit: 2012-01-25 | Discharge: 2012-01-25 | Disposition: A | Discharge status: Home / Self Care | Payer: Self-pay | Source: Ambulatory Visit | Attending: Obstetrics | Admitting: Obstetrics

## 2012-01-25 ENCOUNTER — Other Ambulatory Visit: Payer: Self-pay

## 2012-01-25 DIAGNOSIS — O10919 Unspecified pre-existing hypertension complicating pregnancy, unspecified trimester: Secondary | ICD-10-CM | POA: Diagnosis present

## 2012-01-25 DIAGNOSIS — O24419 Gestational diabetes mellitus in pregnancy, unspecified control: Secondary | ICD-10-CM | POA: Diagnosis present

## 2012-01-25 DIAGNOSIS — E860 Dehydration: Secondary | ICD-10-CM

## 2012-01-25 DIAGNOSIS — O10019 Pre-existing essential hypertension complicating pregnancy, unspecified trimester: Secondary | ICD-10-CM | POA: Insufficient documentation

## 2012-01-25 DIAGNOSIS — J45909 Unspecified asthma, uncomplicated: Secondary | ICD-10-CM | POA: Clinically undetermined

## 2012-01-25 DIAGNOSIS — R42 Dizziness and giddiness: Secondary | ICD-10-CM | POA: Insufficient documentation

## 2012-01-25 DIAGNOSIS — O99891 Other specified diseases and conditions complicating pregnancy: Secondary | ICD-10-CM | POA: Insufficient documentation

## 2012-01-25 DIAGNOSIS — R51 Headache: Secondary | ICD-10-CM | POA: Insufficient documentation

## 2012-01-25 DIAGNOSIS — O0990 Supervision of high risk pregnancy, unspecified, unspecified trimester: Secondary | ICD-10-CM

## 2012-01-25 DIAGNOSIS — D696 Thrombocytopenia, unspecified: Secondary | ICD-10-CM | POA: Diagnosis present

## 2012-01-25 DIAGNOSIS — O99810 Abnormal glucose complicating pregnancy: Secondary | ICD-10-CM | POA: Insufficient documentation

## 2012-01-25 HISTORY — DX: Gestational diabetes mellitus in pregnancy, unspecified control: O24.419

## 2012-01-25 LAB — URINALYSIS, ROUTINE W REFLEX MICROSCOPIC
Bilirubin Urine: NEGATIVE
Glucose, UA: NEGATIVE mg/dL
Hgb urine dipstick: NEGATIVE
Ketones, ur: NEGATIVE mg/dL
Leukocytes, UA: NEGATIVE
Nitrite: NEGATIVE
Protein, ur: NEGATIVE mg/dL
Specific Gravity, Urine: 1.025 (ref 1.005–1.030)
Urobilinogen, UA: 0.2 mg/dL (ref 0.0–1.0)
pH: 6.5 (ref 5.0–8.0)

## 2012-01-25 LAB — BASIC METABOLIC PANEL WITH GFR
BUN: 4 mg/dL — ABNORMAL LOW (ref 6–23)
CO2: 24 meq/L (ref 19–32)
Calcium: 11.3 mg/dL — ABNORMAL HIGH (ref 8.4–10.5)
Chloride: 102 meq/L (ref 96–112)
Creatinine, Ser: 0.4 mg/dL — ABNORMAL LOW (ref 0.50–1.10)
GFR calc Af Amer: >90 mL/min (ref >90)
GFR calc non Af Amer: >90 mL/min (ref >90)
Glucose, Bld: 136 mg/dL — ABNORMAL HIGH (ref 70–99)
Potassium: 3.5 meq/L (ref 3.5–5.1)
Sodium: 132 meq/L — ABNORMAL LOW (ref 135–145)

## 2012-01-25 LAB — CBC
Hemoglobin: 10.5 g/dL — ABNORMAL LOW (ref 12.0–15.0)
MCH: 22.2 pg — ABNORMAL LOW (ref 26.0–34.0)
MCHC: 29.3 g/dL — ABNORMAL LOW (ref 30.0–36.0)
MCV: 75.7 fL — ABNORMAL LOW (ref 78.0–100.0)
RBC: 4.73 MIL/uL (ref 3.87–5.11)

## 2012-01-25 MED ORDER — LABETALOL HCL 200 MG PO TABS: 200.0000 mg | ORAL_TABLET | Freq: Three times a day (TID) | ORAL | Status: DC

## 2012-01-25 MED ORDER — ACETAMINOPHEN 500 MG PO TABS
1000.0000 mg | ORAL_TABLET | Freq: Once | ORAL | Status: AC
  Administered 2012-01-25: 1000 mg via ORAL
  Filled 2012-01-25: qty 2

## 2012-01-25 MED ORDER — CETIRIZINE HCL 10 MG PO TABS: 10.0000 mg | ORAL_TABLET | Freq: Every day | ORAL | Status: DC

## 2012-01-25 MED ORDER — ALBUTEROL SULFATE HFA 108 (90 BASE) MCG/ACT IN AERS: 2.0000 | INHALATION_SPRAY | Freq: Four times a day (QID) | RESPIRATORY_TRACT | Status: DC | PRN

## 2012-01-25 NOTE — MAU Note (Signed)
Taking Labetalol 200 mg tid ran out took last one this morning, onset of headache, dizziness, weakness and palpitations since this morning patient is [redacted]w[redacted]d

## 2012-01-25 NOTE — Discharge Instructions (Signed)
Dehydration, Adult Dehydration is when you lose more fluids from the body than you take in. Vital organs like the kidneys, brain, and heart cannot function without a proper amount of fluids and salt. Any loss of fluids from the body can cause dehydration.  CAUSES   Vomiting.   Diarrhea.   Excessive sweating.   Excessive urine output.   Fever.  SYMPTOMS  Mild dehydration  Thirst.   Dry lips.   Slightly dry mouth.  Moderate dehydration  Very dry mouth.   Sunken eyes.   Skin does not bounce back quickly when lightly pinched and released.   Dark urine and decreased urine production.   Decreased tear production.   Headache.  Severe dehydration  Very dry mouth.   Extreme thirst.   Rapid, weak pulse (more than 100 beats per minute at rest).   Cold hands and feet.   Not able to sweat in spite of heat and temperature.   Rapid breathing.   Blue lips.   Confusion and lethargy.   Difficulty being awakened.   Minimal urine production.   No tears.  DIAGNOSIS  Your caregiver will diagnose dehydration based on your symptoms and your exam. Blood and urine tests will help confirm the diagnosis. The diagnostic evaluation should also identify the cause of dehydration. TREATMENT  Treatment of mild or moderate dehydration can often be done at home by increasing the amount of fluids that you drink. It is best to drink small amounts of fluid more often. Drinking too much at one time can make vomiting worse. Refer to the home care instructions below. Severe dehydration needs to be treated at the hospital where you will probably be given intravenous (IV) fluids that contain water and electrolytes. HOME CARE INSTRUCTIONS   Ask your caregiver about specific rehydration instructions.   Drink enough fluids to keep your urine clear or pale yellow.   Drink small amounts frequently if you have nausea and vomiting.   Eat as you normally do.   Avoid:   Foods or drinks high in  sugar.   Carbonated drinks.   Juice.   Extremely hot or cold fluids.   Drinks with caffeine.   Fatty, greasy foods.   Alcohol.   Tobacco.   Overeating.   Gelatin desserts.   Wash your hands well to avoid spreading bacteria and viruses.   Only take over-the-counter or prescription medicines for pain, discomfort, or fever as directed by your caregiver.   Ask your caregiver if you should continue all prescribed and over-the-counter medicines.   Keep all follow-up appointments with your caregiver.  SEEK MEDICAL CARE IF:  You have abdominal pain and it increases or stays in one area (localizes).   You have a rash, stiff neck, or severe headache.   You are irritable, sleepy, or difficult to awaken.   You are weak, dizzy, or extremely thirsty.  SEEK IMMEDIATE MEDICAL CARE IF:   You are unable to keep fluids down or you get worse despite treatment.   You have frequent episodes of vomiting or diarrhea.   You have blood or green matter (bile) in your vomit.   You have blood in your stool or your stool looks black and tarry.   You have not urinated in 6 to 8 hours, or you have only urinated a small amount of very dark urine.   You have a fever.   You faint.  MAKE SURE YOU:   Understand these instructions.   Will watch your condition.     Will get help right away if you are not doing well or get worse.  Document Released: 10/06/2005 Document Revised: 09/25/2011 Document Reviewed: 05/26/2011 [facility] Patient Information 2012 [place], [place].Dehydration, Adult Dehydration is when you lose more fluids from the body than you take in. Vital organs like the kidneys, brain, and heart cannot function without a proper amount of fluids and salt. Any loss of fluids from the body can cause dehydration.  CAUSES   Vomiting.   Diarrhea.   Excessive sweating.   Excessive urine output.   Fever.  SYMPTOMS  Mild dehydration  Thirst.   Dry lips.   Slightly dry mouth.    Moderate dehydration  Very dry mouth.   Sunken eyes.   Skin does not bounce back quickly when lightly pinched and released.   Dark urine and decreased urine production.   Decreased tear production.   Headache.  Severe dehydration  Very dry mouth.   Extreme thirst.   Rapid, weak pulse (more than 100 beats per minute at rest).   Cold hands and feet.   Not able to sweat in spite of heat and temperature.   Rapid breathing.   Blue lips.   Confusion and lethargy.   Difficulty being awakened.   Minimal urine production.   No tears.  DIAGNOSIS  Your caregiver will diagnose dehydration based on your symptoms and your exam. Blood and urine tests will help confirm the diagnosis. The diagnostic evaluation should also identify the cause of dehydration. TREATMENT  Treatment of mild or moderate dehydration can often be done at home by increasing the amount of fluids that you drink. It is best to drink small amounts of fluid more often. Drinking too much at one time can make vomiting worse. Refer to the home care instructions below. Severe dehydration needs to be treated at the hospital where you will probably be given intravenous (IV) fluids that contain water and electrolytes. HOME CARE INSTRUCTIONS   Ask your caregiver about specific rehydration instructions.   Drink enough fluids to keep your urine clear or pale yellow.   Drink small amounts frequently if you have nausea and vomiting.   Eat as you normally do.   Avoid:   Foods or drinks high in sugar.   Carbonated drinks.   Juice.   Extremely hot or cold fluids.   Drinks with caffeine.   Fatty, greasy foods.   Alcohol.   Tobacco.   Overeating.   Gelatin desserts.   Wash your hands well to avoid spreading bacteria and viruses.   Only take over-the-counter or prescription medicines for pain, discomfort, or fever as directed by your caregiver.   Ask your caregiver if you should continue all prescribed  and over-the-counter medicines.   Keep all follow-up appointments with your caregiver.  SEEK MEDICAL CARE IF:  You have abdominal pain and it increases or stays in one area (localizes).   You have a rash, stiff neck, or severe headache.   You are irritable, sleepy, or difficult to awaken.   You are weak, dizzy, or extremely thirsty.  SEEK IMMEDIATE MEDICAL CARE IF:   You are unable to keep fluids down or you get worse despite treatment.   You have frequent episodes of vomiting or diarrhea.   You have blood or green matter (bile) in your vomit.   You have blood in your stool or your stool looks black and tarry.   You have not urinated in 6 to 8 hours, or you have only urinated a small amount  of very dark urine.   You have a fever.   You faint.  MAKE SURE YOU:   Understand these instructions.   Will watch your condition.   Will get help right away if you are not doing well or get worse.  Document Released: 10/06/2005 Document Revised: 09/25/2011 Document Reviewed: 05/26/2011 [facility] Patient Information 2012 [place], [place]. Gestational Diabetes Mellitus Gestational diabetes mellitus (GDM) is diabetes that occurs only during pregnancy. This happens when the body cannot properly handle the glucose (sugar) that increases in the blood after eating. During pregnancy, insulin resistance (reduced sensitivity to insulin) occurs because of the release of hormones from the placenta. Usually, the pancreas of pregnant women produces enough insulin to overcome the resistance that occurs. However, in gestational diabetes, the insulin is there but it does not work effectively. If the resistance is severe enough that the pancreas does not produce enough insulin, extra glucose builds up in the blood.  WHO IS AT RISK FOR DEVELOPING GESTATIONAL DIABETES?  Women with a history of diabetes in the family.   Women over age 74.   Women who are overweight.   Women in certain ethnic groups  (Hispanic, African American, Native American, [place] and [place]er).  WHAT CAN HAPPEN TO THE BABY? If the mother's blood glucose is too high while she is pregnant, the extra sugar will travel through the umbilical cord to the baby. Some of the problems the baby may have are:  Large Baby - If the baby receives too much sugar, the baby will gain more weight. This may cause the baby to be too large to be born normally (vaginally) and a Cesarean section (C-section) may be needed.   Low Blood Glucose (hypoglycemia) - The baby makes extra insulin, in response to the extra sugar its gets from its mother. When the baby is born and no longer needs this extra insulin, the baby's blood glucose level may drop.   Jaundice (yellow coloring of the skin and eyes) - This is fairly common in babies. It is caused from a build-up of the chemical called bilirubin. This is rarely serious, but is seen more often in babies whose mothers had gestational diabetes.  RISKS TO THE MOTHER Women who have had gestational diabetes may be at higher risk for some problems, including:  Preeclampsia or toxemia, which includes problems with high blood pressure. Blood pressure and protein levels in the urine must be checked frequently.   Infections.   Cesarean section (C-section) for delivery.   Developing Type 2 diabetes later in life. About 30-50% will develop diabetes later, especially if obese.  DIAGNOSIS  The hormones that cause insulin resistance are highest at about 24-28 weeks of pregnancy. If symptoms are experienced, they are much like symptoms you would normally expect during pregnancy.  GDM is often diagnosed using a two part method: 1. After 24-28 weeks of pregnancy, the woman drinks a glucose solution and takes a blood test. If the glucose level is high, a second test will be given.  2. Oral Glucose Tolerance Test (OGTT) which is 3 hours long - After not eating overnight, the blood glucose is checked. The woman  drinks a glucose solution, and hourly blood glucose tests are taken.  If the woman has risk factors for GDM, the caregiver may test earlier than 24 weeks of pregnancy. TREATMENT  Treatment of GDM is directed at keeping the mother's blood glucose level normal, and may include:  Meal planning.   Taking insulin or other medicine to  control your blood glucose level.   Exercise.   Keeping a daily record of the foods you eat.   Blood glucose monitoring and keeping a record of your blood glucose levels.   May monitor ketone levels in the urine, although this is no longer considered necessary in most pregnancies.  HOME CARE INSTRUCTIONS  While you are pregnant:  Follow your caregiver's advice regarding your prenatal appointments, meal planning, exercise, medicines, vitamins, blood and other tests, and physical activities.   Keep a record of your meals, blood glucose tests, and the amount of insulin you are taking (if any). Show this to your caregiver at every prenatal visit.   If you have GDM, you may have problems with hypoglycemia (low blood glucose). You may suspect this if you become suddenly dizzy, feel shaky, and/or weak. If you think this is happening and you have a glucose meter, try to test your blood glucose level. Follow your caregiver's advice for when and how to treat your low blood glucose. Generally, the 15:15 rule is followed: Treat by consuming 15 grams of carbohydrates, wait 15 minutes, and recheck blood glucose. Examples of 15 grams of carbohydrates are:   1 cup skim or low-fat milk.    cup juice.   3-4 glucose tablets.   5-6 hard candies.   1 small box raisins.    cup regular soda pop.   Practice good hygiene, to avoid infections.   Do not smoke.  SEEK MEDICAL CARE IF:   You develop abnormal vaginal discharge, with or without itching.   You become weak and tired more than expected.   You seem to sweat a lot.   You have a sudden increase in weight, 5  pounds or more in one week.   You are losing weight, 3 pounds or more in a week.   Your blood glucose level is high, and you need instructions on what to do about it.  SEEK IMMEDIATE MEDICAL CARE IF:   You develop a severe headache.   You faint or pass out.   You develop nausea and vomiting.   You become disoriented or confused.   You have a convulsion.   You develop vision problems.   You develop stomach pain.   You develop vaginal bleeding.   You develop uterine contractions.   You have leaking or a gush of fluid from the vagina.  AFTER YOU HAVE THE BABY:  Go to all of your follow-up appointments, and have blood tests as advised by your caregiver.   Maintain a healthy lifestyle, to prevent diabetes in the future. This includes:   Following a healthy meal plan.   Controlling your weight.   Getting enough exercise and proper rest.   Do not smoke.   Breastfeed your baby if you can. This will lower the chance of you and your baby developing diabetes later in life.  For more information about diabetes, go to the American Diabetes Association at: PMFashions.com.cy. For more information about gestational diabetes, go to the [employer] at: RentRule.com.au. Document Released: 01/12/2001 Document Revised: 09/25/2011 Document Reviewed: 08/06/2009 [facility] Patient Information 2012 [place], [place].Hypertension As your heart beats, it forces blood through your arteries. This force is your blood pressure. If the pressure is too high, it is called hypertension (HTN) or high blood pressure. HTN is dangerous because you may have it and not know it. High blood pressure may mean that your heart has to work harder to pump blood. Your arteries may  be narrow or stiff. The extra work puts you at risk for heart disease, stroke, and other problems.  Blood pressure consists of two numbers, a higher number over a lower, 110/72, for example.  It is stated as "110 over 72." The ideal is below 120 for the top number (systolic) and under 80 for the bottom (diastolic). Write down your blood pressure today. You should pay close attention to your blood pressure if you have certain conditions such as:  Heart failure.   Prior heart attack.   Diabetes   Chronic kidney disease.   Prior stroke.   Multiple risk factors for heart disease.  To see if you have HTN, your blood pressure should be measured while you are seated with your arm held at the level of the heart. It should be measured at least twice. A one-time elevated blood pressure reading (especially in the Emergency Department) does not mean that you need treatment. There may be conditions in which the blood pressure is different between your right and left arms. It is important to see your caregiver soon for a recheck. Most people have essential hypertension which means that there is not a specific cause. This type of high blood pressure may be lowered by changing lifestyle factors such as:  Stress.   Smoking.   Lack of exercise.   Excessive weight.   Drug/tobacco/alcohol use.   Eating less salt.  Most people do not have symptoms from high blood pressure until it has caused damage to the body. Effective treatment can often prevent, delay or reduce that damage. TREATMENT  When a cause has been identified, treatment for high blood pressure is directed at the cause. There are a large number of medications to treat HTN. These fall into several categories, and your caregiver will help you select the medicines that are best for you. Medications may have side effects. You should review side effects with your caregiver. If your blood pressure stays high after you have made lifestyle changes or started on medicines,   Your medication(s) may need to be changed.   Other problems may need to be addressed.   Be certain you understand your prescriptions, and know how and when to take  your medicine.   Be sure to follow up with your caregiver within the time frame advised (usually within two weeks) to have your blood pressure rechecked and to review your medications.   If you are taking more than one medicine to lower your blood pressure, make sure you know how and at what times they should be taken. Taking two medicines at the same time can result in blood pressure that is too low.  SEEK IMMEDIATE MEDICAL CARE IF:  You develop a severe headache, blurred or changing vision, or confusion.   You have unusual weakness or numbness, or a faint feeling.   You have severe chest or abdominal pain, vomiting, or breathing problems.  MAKE SURE YOU:   Understand these instructions.   Will watch your condition.   Will get help right away if you are not doing well or get worse.  Document Released: 10/06/2005 Document Revised: 09/25/2011 Document Reviewed: 05/26/2008 [facility] Patient Information 2012 [place], [place].Allergies, Generic Allergies may happen from anything your body is sensitive to. This may be food, medicines, pollens, chemicals, and nearly anything around you in everyday life that produces allergens. An allergen is anything that causes an allergy producing substance. Heredity is often a factor in causing these problems. This means you may have some  of the same allergies as your parents. Food allergies happen in all age groups. Food allergies are some of the most severe and life threatening. Some common food allergies are cow's milk, seafood, eggs, nuts, wheat, and soybeans. SYMPTOMS   Swelling around the mouth.   An itchy red rash or hives.   Vomiting or diarrhea.   Difficulty breathing.  SEVERE ALLERGIC REACTIONS ARE LIFE-THREATENING. This reaction is called anaphylaxis. It can cause the mouth and throat to swell and cause difficulty with breathing and swallowing. In severe reactions only a trace amount of food (for example, peanut oil in a salad) may cause death  within seconds. Seasonal allergies occur in all age groups. These are seasonal because they usually occur during the same season every year. They may be a reaction to molds, grass pollens, or tree pollens. Other causes of problems are house dust mite allergens, pet dander, and mold spores. The symptoms often consist of nasal congestion, a runny itchy nose associated with sneezing, and tearing itchy eyes. There is often an associated itching of the mouth and ears. The problems happen when you come in contact with pollens and other allergens. Allergens are the particles in the air that the body reacts to with an allergic reaction. This causes you to release allergic antibodies. Through a chain of events, these eventually cause you to release histamine into the blood stream. Although it is meant to be protective to the body, it is this release that causes your discomfort. This is why you were given anti-histamines to feel better. If you are unable to pinpoint the offending allergen, it may be determined by skin or blood testing. Allergies cannot be cured but can be controlled with medicine. Hay fever is a collection of all or some of the seasonal allergy problems. It may often be treated with simple over-the-counter medicine such as diphenhydramine. Take medicine as directed. Do not drink alcohol or drive while taking this medicine. Check with your caregiver or package insert for child dosages. If these medicines are not effective, there are many new medicines your caregiver can prescribe. Stronger medicine such as nasal spray, eye drops, and corticosteroids may be used if the first things you try do not work well. Other treatments such as immunotherapy or desensitizing injections can be used if all else fails. Follow up with your caregiver if problems continue. These seasonal allergies are usually not life threatening. They are generally more of a nuisance that can often be handled using medicine. HOME CARE  INSTRUCTIONS   If unsure what causes a reaction, keep a diary of foods eaten and symptoms that follow. Avoid foods that cause reactions.   If hives or rash are present:   Take medicine as directed.   You may use an over-the-counter antihistamine (diphenhydramine) for hives and itching as needed.   Apply cold compresses (cloths) to the skin or take baths in cool water. Avoid hot baths or showers. Heat will make a rash and itching worse.   If you are severely allergic:   Following a treatment for a severe reaction, hospitalization is often required for closer follow-up.   Wear a medic-alert bracelet or necklace stating the allergy.   You and your family must learn how to give adrenaline or use an anaphylaxis kit.   If you have had a severe reaction, always carry your anaphylaxis kit or EpiPen with you. Use this medicine as directed by your caregiver if a severe reaction is occurring. Failure to do so could have  a fatal outcome.  SEEK MEDICAL CARE IF:  You suspect a food allergy. Symptoms generally happen within 30 minutes of eating a food.   Your symptoms have not gone away within 2 days or are getting worse.   You develop new symptoms.   You want to retest yourself or your child with a food or drink you think causes an allergic reaction. Never do this if an anaphylactic reaction to that food or drink has happened before. Only do this under the care of a caregiver.  SEEK IMMEDIATE MEDICAL CARE IF:   You have difficulty breathing, are wheezing, or have a tight feeling in your chest or throat.   You have a swollen mouth, or you have hives, swelling, or itching all over your body.   You have had a severe reaction that has responded to your anaphylaxis kit or an EpiPen. These reactions may return when the medicine has worn off. These reactions should be considered life threatening.  MAKE SURE YOU:   Understand these instructions.   Will watch your condition.   Will get help  right away if you are not doing well or get worse.  Document Released: 12/30/2002 Document Revised: 09/25/2011 Document Reviewed: 06/05/2008 [facility] Patient Information 2012 [place], [place].Asthma, Adult Asthma is caused by narrowing of the air passages in the lungs. It may be triggered by pollen, dust, animal dander, molds, some foods, respiratory infections, exposure to smoke, exercise, emotional stress or other allergens (things that cause allergic reactions or allergies). Repeat attacks are common. HOME CARE INSTRUCTIONS   Use prescription medications as ordered by your caregiver.   Avoid pollen, dust, animal dander, molds, smoke and other things that cause attacks at home and at work.   You may have fewer attacks if you decrease dust in your home. Electrostatic air cleaners may help.   It may help to replace your pillows or mattress with materials less likely to cause allergies.   Talk to your caregiver about an action plan for managing asthma attacks at home, including, the use of a peak flow meter which measures the severity of your asthma attack. An action plan can help minimize or stop the attack without having to seek medical care.   If you are not on a fluid restriction, drink 8 to 10 glasses of water each day.   Always have a plan prepared for seeking medical attention, including, calling your physician, accessing local emergency care, and calling 911 (in the [place] for a severe attack.   Discuss possible exercise routines with your caregiver.   If animal dander is the cause of asthma, you may need to get rid of pets.  SEEK MEDICAL CARE IF:   You have wheezing and shortness of breath even if taking medicine to prevent attacks.   You have muscle aches, chest pain or thickening of sputum.   Your sputum changes from clear or white to yellow, green, gray, or bloody.   You have any problems that may be related to the medicine you are taking (such as a rash, itching, swelling or  trouble breathing).  SEEK IMMEDIATE MEDICAL CARE IF:   Your usual medicines do not stop your wheezing or there is increased coughing and/or shortness of breath.   You have increased difficulty breathing.   You have a fever.  MAKE SURE YOU:   Understand these instructions.   Will watch your condition.   Will get help right away if you are not doing well or get worse.  Document  Released: 10/06/2005 Document Revised: 09/25/2011 Document Reviewed: 05/24/2008 [facility] Patient Information 2012 [place].

## 2012-01-25 NOTE — MAU Provider Note (Signed)
History     CSN: [id number]  Arrival date and time: 01/25/12 1202   First Provider Initiated Contact with Patient 01/25/12 1245      Chief Complaint  Patient presents with  . Headache  . Dizziness  . Fatigue   HPI This is a 36 y.o. at [redacted]w[redacted]d who presents for c/o dizziness and heart racing.   This morning she was very fatigued, dizzy and felt like her heart was racing and missing beats. She was out of labetalol 4 chronic hypertension, so did not take it yesterday but saved one dose that she took this morning. After taking labetalol she felt better but she still has a headache at present. States one hour OGTT was 199 and is scheduled for three-hour in 2 days.   Also c/o shortness of breath at night only. Was dx'ed with asthma in [place] and had inhaler, but has none now. States can get records mailed.   OB History    Grav Para Term Preterm Abortions TAB SAB Ect Mult Living   3 1 1  0 1 0 1 0 0 1      Past Medical History  Diagnosis Date  . Pregnancy induced hypertension   . Asthma   . Gestational diabetes     Past Surgical History  Procedure Date  . Cesarean section   . Appendectomy     Family History  Problem Relation Age of Onset  . Anesthesia problems Neg Hx   . Hypertension Mother   . Diabetes Father   . Asthma Father     History  Substance Use Topics  . Smoking status: Never Smoker   . Smokeless tobacco: Never Used  . Alcohol Use: No    Allergies:  Allergies  Allergen Reactions  . Aspirin Itching    Prescriptions prior to admission  Medication Sig Dispense Refill  . labetalol (NORMODYNE) 200 MG tablet Take 200 mg by mouth 3 (three) times daily.      . Prenatal Vit-Fe Fumarate-FA (PRENATAL MULTIVITAMIN) TABS Take 1 tablet by mouth daily.          ROS Physical Exam   Blood pressure 149/89, pulse 100, temperature 98.2 F (36.8 C), temperature source Oral, resp. rate 20, height 5\' 5"  (1.651 m), weight 116.03 kg (255 lb 12.8 oz), SpO2 100.00%. Filed  Vitals:   01/25/12 1326 01/25/12 1327 01/25/12 1329 01/25/12 1330  BP: 141/83 151/85 146/89 146/89  Pulse: 96 97 99 99  Temp:      TempSrc:      Resp:      Height:      Weight:      SpO2:        Physical Exam Results for orders placed during the hospital encounter of 01/25/12 (from the past 24 hour(s))  URINALYSIS, ROUTINE W REFLEX MICROSCOPIC     Status: Normal   Collection Time   01/25/12 12:05 PM      Component Value Range   Color, Urine YELLOW  YELLOW    APPearance CLEAR  CLEAR    Specific Gravity, Urine 1.025  1.005 - 1.030    pH 6.5  5.0 - 8.0    Glucose, UA NEGATIVE  NEGATIVE (mg/dL)   Hgb urine dipstick NEGATIVE  NEGATIVE    Bilirubin Urine NEGATIVE  NEGATIVE    Ketones, ur NEGATIVE  NEGATIVE (mg/dL)   Protein, ur NEGATIVE  NEGATIVE (mg/dL)   Urobilinogen, UA 0.2  0.0 - 1.0 (mg/dL)   Nitrite NEGATIVE  NEGATIVE  Leukocytes, UA NEGATIVE  NEGATIVE   CBC     Status: Abnormal   Collection Time   01/25/12  1:37 PM      Component Value Range   WBC 8.0  4.0 - 10.5 (K/uL)   RBC 4.73  3.87 - 5.11 (MIL/uL)   Hemoglobin 10.5 (*) 12.0 - 15.0 (g/dL)   HCT 96.0 (*) 45.4 - 46.0 (%)   MCV 75.7 (*) 78.0 - 100.0 (fL)   MCH 22.2 (*) 26.0 - 34.0 (pg)   MCHC 29.3 (*) 30.0 - 36.0 (g/dL)   RDW 09.8  11.9 - 14.7 (%)   Platelets 123 (*) 150 - 400 (K/uL)   Prenatal Hemoglobin 10.6 and Platelets 134K EKG: normal EKG, normal sinus rhythm, rate 92, nonspecific T waves changes.  MAU Course  Procedures  Assessment and Plan  A;  Dizziness, probably related to dehydration and sinus congestion       Chronic Hypertension, not well controlled due to running out of medication       Gest. Diabetes (states glucola 199)       Asthma   P: Discharge home      Rx Albuterol inhaler x1, advised pt to get records from [facility] (had pulm testing there) and to get a medical doctor for longterm management      Rx Labetalol refilled for 200mg  tid      Rx Zyrtec for allergy control     Discussed  hydration   POE,DEIRDRE 01/25/2012, 12:52 PM

## 2012-02-03 ENCOUNTER — Ambulatory Visit (HOSPITAL_COMMUNITY)
Admission: RE | Admit: 2012-02-03 | Discharge: 2012-02-03 | Disposition: A | Discharge status: Home / Self Care | Payer: Self-pay | Source: Ambulatory Visit | Attending: Obstetrics | Admitting: Obstetrics

## 2012-02-03 DIAGNOSIS — O34219 Maternal care for unspecified type scar from previous cesarean delivery: Secondary | ICD-10-CM | POA: Insufficient documentation

## 2012-02-03 DIAGNOSIS — O10019 Pre-existing essential hypertension complicating pregnancy, unspecified trimester: Secondary | ICD-10-CM | POA: Insufficient documentation

## 2012-02-03 DIAGNOSIS — O358XX0 Maternal care for other (suspected) fetal abnormality and damage, not applicable or unspecified: Secondary | ICD-10-CM | POA: Insufficient documentation

## 2012-02-03 DIAGNOSIS — O09529 Supervision of elderly multigravida, unspecified trimester: Secondary | ICD-10-CM | POA: Insufficient documentation

## 2012-02-03 DIAGNOSIS — O99210 Obesity complicating pregnancy, unspecified trimester: Secondary | ICD-10-CM | POA: Insufficient documentation

## 2012-02-03 DIAGNOSIS — E669 Obesity, unspecified: Secondary | ICD-10-CM | POA: Insufficient documentation

## 2012-02-03 LAB — PLATELET COUNT: Platelets: 126 10*3/uL — ABNORMAL LOW (ref 150–400)

## 2012-02-09 ENCOUNTER — Inpatient Hospital Stay (HOSPITAL_COMMUNITY)
Admission: AD | Admit: 2012-02-09 | Discharge: 2012-02-09 | Disposition: A | Discharge status: Home / Self Care | Payer: Self-pay | Source: Ambulatory Visit | Attending: Obstetrics | Admitting: Obstetrics

## 2012-02-09 ENCOUNTER — Inpatient Hospital Stay (HOSPITAL_COMMUNITY): Payer: Self-pay

## 2012-02-09 ENCOUNTER — Encounter (HOSPITAL_COMMUNITY): Payer: Self-pay | Admitting: *Deleted

## 2012-02-09 DIAGNOSIS — O4700 False labor before 37 completed weeks of gestation, unspecified trimester: Secondary | ICD-10-CM | POA: Insufficient documentation

## 2012-02-09 DIAGNOSIS — O99810 Abnormal glucose complicating pregnancy: Secondary | ICD-10-CM

## 2012-02-09 DIAGNOSIS — O24419 Gestational diabetes mellitus in pregnancy, unspecified control: Secondary | ICD-10-CM

## 2012-02-09 DIAGNOSIS — O479 False labor, unspecified: Secondary | ICD-10-CM

## 2012-02-09 LAB — URINALYSIS, ROUTINE W REFLEX MICROSCOPIC
Hgb urine dipstick: NEGATIVE
Leukocytes, UA: NEGATIVE
Nitrite: NEGATIVE
Specific Gravity, Urine: 1.015 (ref 1.005–1.030)
Urobilinogen, UA: 0.2 mg/dL (ref 0.0–1.0)

## 2012-02-09 MED ORDER — LABETALOL HCL 100 MG PO TABS
200.0000 mg | ORAL_TABLET | Freq: Once | ORAL | Status: AC
  Administered 2012-02-09: 200 mg via ORAL
  Filled 2012-02-09: qty 2

## 2012-02-09 NOTE — Discharge Instructions (Signed)

## 2012-02-09 NOTE — MAU Provider Note (Signed)
History     CSN: [id number]  Arrival date and time: 02/09/12 1242   None     Chief Complaint  Patient presents with  . Labor Eval   HPI Kylie Baker is 36 y.o. G3P1011 [redacted]w[redacted]d weeks presenting with cramping in the lower abdominal that come and go that stop her from walking.  Episodes last 1-2 minutes and come every 1-2 hrs.  Denies vaginal bleeding, leaking of fluid.  Reports + fetal movement.  She is a patient of Dr. Marcia Brash.  Has appt for next Monday.  Had food at 10am but liquids at 6am.    Past Medical History  Diagnosis Date  . Pregnancy induced hypertension   . Asthma   . Gestational diabetes   . Gestational diabetes 01/25/2012    Past Surgical History  Procedure Date  . Cesarean section   . Appendectomy     Family History  Problem Relation Age of Onset  . Anesthesia problems Neg Hx   . Hypertension Mother   . Diabetes Father   . Asthma Father     History  Substance Use Topics  . Smoking status: Never Smoker   . Smokeless tobacco: Never Used  . Alcohol Use: No    Allergies:  Allergies  Allergen Reactions  . Aspirin Itching    Prescriptions prior to admission  Medication Sig Dispense Refill  . labetalol (NORMODYNE) 200 MG tablet Take 200 mg by mouth 3 (three) times daily.      . Prenatal Vit-Fe Fumarate-FA (PRENATAL MULTIVITAMIN) TABS Take 1 tablet by mouth every morning.       Marland Kitchen albuterol (PROVENTIL HFA;VENTOLIN HFA) 108 (90 BASE) MCG/ACT inhaler Inhale 2 puffs into the lungs every 6 (six) hours as needed for wheezing.  1 Inhaler  0    Review of Systems  Gastrointestinal: Positive for abdominal pain (? contractions).  Genitourinary:       Negative for bleeding or leaking of fluid   Physical Exam   Blood pressure 146/83, pulse 92, temperature 99.2 F (37.3 C), temperature source Oral, resp. rate 20, height 5\' 5"  (1.651 m), weight 115.667 kg (255 lb), SpO2 100.00%.  Physical Exam  Constitutional: She is oriented to person, place, and  time. She appears well-developed and well-nourished. No distress.  HENT:  Head: Normocephalic.  Neck: Normal range of motion.  Cardiovascular: Normal rate.   Respiratory: Effort normal.  Genitourinary:       Cervical exam by Lawson Fiscal, RN.  Long, closed and thick  Neurological: She is alert and oriented to person, place, and time.  Skin: Skin is warm and dry.  Psychiatric: She has a normal mood and affect. Her behavior is normal.   Results for orders placed during the hospital encounter of 02/09/12 (from the past 24 hour(s))  URINALYSIS, ROUTINE W REFLEX MICROSCOPIC     Status: Normal   Collection Time   02/09/12 12:41 PM      Component Value Range   Color, Urine YELLOW  YELLOW    APPearance CLEAR  CLEAR    Specific Gravity, Urine 1.015  1.005 - 1.030    pH 7.0  5.0 - 8.0    Glucose, UA NEGATIVE  NEGATIVE (mg/dL)   Hgb urine dipstick NEGATIVE  NEGATIVE    Bilirubin Urine NEGATIVE  NEGATIVE    Ketones, ur NEGATIVE  NEGATIVE (mg/dL)   Protein, ur NEGATIVE  NEGATIVE (mg/dL)   Urobilinogen, UA 0.2  0.0 - 1.0 (mg/dL)   Nitrite NEGATIVE  NEGATIVE    Leukocytes,  UA NEGATIVE  NEGATIVE     ULTRASOUND REPORT BPP 8/8, >2cm pocket of fluid seen in 2 planes MAU Course  Procedures    MDM BBP ordered for non reactive NST.  Blood pressure 146/83  Since return from ultrasound, Baseline fetal heart rate is 140, moderate variability, no contractions seen  10X10s seen.  BPP 8/8 with >2cm pocket of fluid seen in 2 planes.  Patient is late taking her labetalol 200mg .  Dose ordered.  Last BP reading 149/89 Reported this information to Dr. Tamela Oddi at 16:05.  Patient may be discharged to home.  Keep scheduled appointment  Assessment and Plan   A:  Braxton-Hicks contractions at [redacted]weeks gestation      Hx hypertension and gestational diabetes with BPP 8/8  P:  Encouraged to continue Labetalol as prescribed     Report to Dr.Jackson-Moore if contractions become stronger and regular, loss of vaginal  fluid/bleeding or decreased fetal movement      Kylie Baker,EVE M 02/09/2012, 1:22 PM

## 2012-02-09 NOTE — MAU Note (Signed)
Pt states lower abd cramping started yesterday around 1700 and will last 1-2 hours before going away and then coming back.  Pt states the cramping pain came back about 1100 today and continues along with pressure in bladder.

## 2012-02-10 ENCOUNTER — Other Ambulatory Visit: Payer: Self-pay

## 2012-02-19 ENCOUNTER — Ambulatory Visit (HOSPITAL_COMMUNITY): Admission: RE | Admit: 2012-02-19 | Payer: Self-pay | Source: Ambulatory Visit

## 2012-02-19 ENCOUNTER — Encounter: Payer: Self-pay | Attending: Obstetrics | Admitting: Dietician

## 2012-02-19 ENCOUNTER — Other Ambulatory Visit: Payer: Self-pay

## 2012-02-19 ENCOUNTER — Ambulatory Visit (HOSPITAL_COMMUNITY)
Admission: RE | Admit: 2012-02-19 | Discharge: 2012-02-19 | Disposition: A | Discharge status: Home / Self Care | Payer: Self-pay | Source: Ambulatory Visit | Attending: Obstetrics | Admitting: Obstetrics

## 2012-02-19 DIAGNOSIS — Z713 Dietary counseling and surveillance: Secondary | ICD-10-CM | POA: Insufficient documentation

## 2012-02-19 DIAGNOSIS — O99810 Abnormal glucose complicating pregnancy: Secondary | ICD-10-CM | POA: Insufficient documentation

## 2012-02-19 NOTE — ED Notes (Signed)
Diabetes Education:  Comes in today for nutritional counseling for GDM.  On 4/23 3 hr GTT results:fasting =91 mg 1 hr=178 mg, 2 hr=192 mg, and 3hr=157 mg.  Completed review of the diet and provided following handouts:Nutrition, Diabetes and Pregnancy and Carb Counting Guide.  Has her own meter and has been monitoring at home.  Was provided with recording sheets and is to bring in the results of testing to her clinic appointment on 02/24/2012.  Maggie Jemari Hallum, RN, RD, CDE.

## 2012-02-23 ENCOUNTER — Other Ambulatory Visit (HOSPITAL_COMMUNITY): Payer: Self-pay | Admitting: Obstetrics

## 2012-02-23 DIAGNOSIS — O358XX0 Maternal care for other (suspected) fetal abnormality and damage, not applicable or unspecified: Secondary | ICD-10-CM

## 2012-02-23 DIAGNOSIS — O09529 Supervision of elderly multigravida, unspecified trimester: Secondary | ICD-10-CM

## 2012-02-23 DIAGNOSIS — O10019 Pre-existing essential hypertension complicating pregnancy, unspecified trimester: Secondary | ICD-10-CM

## 2012-02-23 DIAGNOSIS — O34219 Maternal care for unspecified type scar from previous cesarean delivery: Secondary | ICD-10-CM

## 2012-02-24 ENCOUNTER — Ambulatory Visit (HOSPITAL_COMMUNITY)
Admission: RE | Admit: 2012-02-24 | Discharge: 2012-02-24 | Disposition: A | Discharge status: Home / Self Care | Payer: Self-pay | Source: Ambulatory Visit | Attending: Obstetrics | Admitting: Obstetrics

## 2012-02-24 VITALS — BP 119/70 | HR 92 | Wt 255.0 lb

## 2012-02-24 DIAGNOSIS — O09529 Supervision of elderly multigravida, unspecified trimester: Secondary | ICD-10-CM | POA: Insufficient documentation

## 2012-02-24 DIAGNOSIS — E669 Obesity, unspecified: Secondary | ICD-10-CM | POA: Insufficient documentation

## 2012-02-24 DIAGNOSIS — O10919 Unspecified pre-existing hypertension complicating pregnancy, unspecified trimester: Secondary | ICD-10-CM

## 2012-02-24 DIAGNOSIS — O34219 Maternal care for unspecified type scar from previous cesarean delivery: Secondary | ICD-10-CM | POA: Insufficient documentation

## 2012-02-24 DIAGNOSIS — O358XX0 Maternal care for other (suspected) fetal abnormality and damage, not applicable or unspecified: Secondary | ICD-10-CM

## 2012-02-24 DIAGNOSIS — O24419 Gestational diabetes mellitus in pregnancy, unspecified control: Secondary | ICD-10-CM

## 2012-02-24 DIAGNOSIS — O99810 Abnormal glucose complicating pregnancy: Secondary | ICD-10-CM | POA: Insufficient documentation

## 2012-02-24 DIAGNOSIS — O10019 Pre-existing essential hypertension complicating pregnancy, unspecified trimester: Secondary | ICD-10-CM | POA: Insufficient documentation

## 2012-02-24 LAB — PLATELET COUNT: Platelets: 122 10*3/uL — ABNORMAL LOW (ref 150–400)

## 2012-02-29 ENCOUNTER — Inpatient Hospital Stay (HOSPITAL_COMMUNITY)
Admission: AD | Admit: 2012-02-29 | Discharge: 2012-02-29 | Disposition: A | Discharge status: Hospice | Payer: Self-pay | Source: Ambulatory Visit | Attending: Obstetrics | Admitting: Obstetrics

## 2012-02-29 ENCOUNTER — Encounter (HOSPITAL_COMMUNITY): Payer: Self-pay | Admitting: *Deleted

## 2012-02-29 DIAGNOSIS — R1012 Left upper quadrant pain: Secondary | ICD-10-CM | POA: Insufficient documentation

## 2012-02-29 DIAGNOSIS — O0990 Supervision of high risk pregnancy, unspecified, unspecified trimester: Secondary | ICD-10-CM

## 2012-02-29 DIAGNOSIS — K219 Gastro-esophageal reflux disease without esophagitis: Secondary | ICD-10-CM | POA: Insufficient documentation

## 2012-02-29 DIAGNOSIS — O99891 Other specified diseases and conditions complicating pregnancy: Secondary | ICD-10-CM | POA: Insufficient documentation

## 2012-02-29 DIAGNOSIS — R10812 Left upper quadrant abdominal tenderness: Secondary | ICD-10-CM

## 2012-02-29 DIAGNOSIS — O24419 Gestational diabetes mellitus in pregnancy, unspecified control: Secondary | ICD-10-CM

## 2012-02-29 DIAGNOSIS — R10816 Epigastric abdominal tenderness: Secondary | ICD-10-CM

## 2012-02-29 LAB — COMPREHENSIVE METABOLIC PANEL
AST: 13 U/L (ref 0–37)
Albumin: 2.9 g/dL — ABNORMAL LOW (ref 3.5–5.2)
Alkaline Phosphatase: 175 U/L — ABNORMAL HIGH (ref 39–117)
Chloride: 104 mEq/L (ref 96–112)
Potassium: 4 mEq/L (ref 3.5–5.1)
Sodium: 135 mEq/L (ref 135–145)
Total Bilirubin: 0.5 mg/dL (ref 0.3–1.2)
Total Protein: 6 g/dL (ref 6.0–8.3)

## 2012-02-29 LAB — CBC
MCHC: 29.2 g/dL — ABNORMAL LOW (ref 30.0–36.0)
Platelets: 121 10*3/uL — ABNORMAL LOW (ref 150–400)
RDW: 15.3 % (ref 11.5–15.5)
WBC: 8.4 10*3/uL (ref 4.0–10.5)

## 2012-02-29 LAB — URINALYSIS, ROUTINE W REFLEX MICROSCOPIC
Hgb urine dipstick: NEGATIVE
Nitrite: NEGATIVE
Specific Gravity, Urine: >1.03 — ABNORMAL HIGH (ref 1.005–1.030)
Urobilinogen, UA: 0.2 mg/dL (ref 0.0–1.0)
pH: 6 (ref 5.0–8.0)

## 2012-02-29 LAB — LACTATE DEHYDROGENASE: LDH: 126 U/L (ref 94–250)

## 2012-02-29 LAB — URIC ACID: Uric Acid, Serum: 3.9 mg/dL (ref 2.4–7.0)

## 2012-02-29 LAB — URINE MICROSCOPIC-ADD ON

## 2012-02-29 MED ORDER — GI COCKTAIL ~~LOC~~
30.0000 mL | Freq: Once | ORAL | Status: AC
  Administered 2012-02-29: 30 mL via ORAL
  Filled 2012-02-29: qty 30

## 2012-02-29 MED ORDER — RANITIDINE HCL 150 MG PO CAPS: 150.0000 mg | ORAL_CAPSULE | Freq: Two times a day (BID) | ORAL | Status: DC

## 2012-02-29 NOTE — MAU Note (Signed)
Pt ate a cooked potato and chicken breast @ 1:00, started having pain in her mid to upper abdomen at 1:30 and tried to have a BM, but was unsuccessful.  She vomited @ 2:00 and did not feel better after that.  States on the way here felt that pain squeezing and releasing all the way to the hospital.  Says it has eased up a little bit since she arrived here.  Last BM was this morning and she says it was a normal one.

## 2012-02-29 NOTE — MAU Note (Signed)
Pt reports she ate and started having pian in her upper abd and vomited. Still having pain. Has history of elevated b/p with this pregnancy and on medication for it.

## 2012-02-29 NOTE — MAU Provider Note (Signed)
History     CSN: [id number]  Arrival date and time: 02/29/12 1515   First Provider Initiated Contact with Patient 02/29/12 1603      No chief complaint on file.  HPI 36 y.o. G3P1011 at [redacted]w[redacted]d with mid-upper/LUQ abd pain starting about 1 PM today, ate lunch prior to pain starting, had roasted chicken and potatoes. Vomited once around 2 PM. Normal BMs, no constipation or diarrhea. Prenatal course complicated by hypertension, on labetalol. No headache or vision changes today. + fetal movement.    Past Medical History  Diagnosis Date  . Pregnancy induced hypertension   . Asthma   . Gestational diabetes   . Gestational diabetes 01/25/2012    Past Surgical History  Procedure Date  . Cesarean section   . Appendectomy     Family History  Problem Relation Age of Onset  . Anesthesia problems Neg Hx   . Hypertension Mother   . Diabetes Father   . Asthma Father     History  Substance Use Topics  . Smoking status: Never Smoker   . Smokeless tobacco: Never Used  . Alcohol Use: No    Allergies:  Allergies  Allergen Reactions  . Aspirin Itching    Prescriptions prior to admission  Medication Sig Dispense Refill  . albuterol (PROVENTIL HFA;VENTOLIN HFA) 108 (90 BASE) MCG/ACT inhaler Inhale 2 puffs into the lungs every 6 (six) hours as needed for wheezing.  1 Inhaler  0  . labetalol (NORMODYNE) 200 MG tablet Take 200 mg by mouth 3 (three) times daily.      . Prenatal Vit-Fe Fumarate-FA (PRENATAL MULTIVITAMIN) TABS Take 1 tablet by mouth every morning.         Review of Systems  Constitutional: Negative.   Respiratory: Negative.   Cardiovascular: Negative.   Gastrointestinal: Positive for nausea, vomiting and abdominal pain. Negative for diarrhea and constipation.  Genitourinary: Negative for dysuria, urgency, frequency, hematuria and flank pain.       Negative for vaginal bleeding or LOF  Musculoskeletal: Negative.   Neurological: Negative.   Psychiatric/Behavioral:  Negative.    Physical Exam   Blood pressure 135/80, pulse 86, temperature 97.4 F (36.3 C), temperature source Oral, resp. rate 16.  Physical Exam  Nursing note and vitals reviewed. Constitutional: She is oriented to person, place, and time. She appears well-developed and well-nourished. She appears distressed (appears in discomfort).  Cardiovascular: Normal rate.   Respiratory: Effort normal. No respiratory distress.  GI: Soft. She exhibits no distension and no mass. There is tenderness (slightly left mid-upper abdomen, below epigastric). There is no rebound and no guarding.  Neurological: She is alert and oriented to person, place, and time.  Skin: Skin is warm and dry.  Psychiatric: She has a normal mood and affect.   EFM reactive, TOCO quiet MAU Course  Procedures  Results for orders placed during the hospital encounter of 02/29/12 (from the past 24 hour(s))  CBC     Status: Abnormal   Collection Time   02/29/12  3:59 PM      Component Value Range   WBC 8.4  4.0 - 10.5 (K/uL)   RBC 4.98  3.87 - 5.11 (MIL/uL)   Hemoglobin 10.8 (*) 12.0 - 15.0 (g/dL)   HCT 78.2  95.6 - 21.3 (%)   MCV 74.3 (*) 78.0 - 100.0 (fL)   MCH 21.7 (*) 26.0 - 34.0 (pg)   MCHC 29.2 (*) 30.0 - 36.0 (g/dL)   RDW 08.6  57.8 - 46.9 (%)  Platelets 121 (*) 150 - 400 (K/uL)  COMPREHENSIVE METABOLIC PANEL     Status: Abnormal   Collection Time   02/29/12  3:59 PM      Component Value Range   Sodium 135  135 - 145 (mEq/L)   Potassium 4.0  3.5 - 5.1 (mEq/L)   Chloride 104  96 - 112 (mEq/L)   CO2 23  19 - 32 (mEq/L)   Glucose, Bld 100 (*) 70 - 99 (mg/dL)   BUN 4 (*) 6 - 23 (mg/dL)   Creatinine, Ser 1.19 (*) 0.50 - 1.10 (mg/dL)   Calcium 14.7 (*) 8.4 - 10.5 (mg/dL)   Total Protein 6.0  6.0 - 8.3 (g/dL)   Albumin 2.9 (*) 3.5 - 5.2 (g/dL)   AST 13  0 - 37 (U/L)   ALT 10  0 - 35 (U/L)   Alkaline Phosphatase 175 (*) 39 - 117 (U/L)   Total Bilirubin 0.5  0.3 - 1.2 (mg/dL)   GFR calc non Af Amer >90  >90  (mL/min)   GFR calc Af Amer >90  >90 (mL/min)  AMYLASE     Status: Normal   Collection Time   02/29/12  3:59 PM      Component Value Range   Amylase 50  0 - 105 (U/L)  LIPASE, BLOOD     Status: Normal   Collection Time   02/29/12  3:59 PM      Component Value Range   Lipase 28  11 - 59 (U/L)  URIC ACID     Status: Normal   Collection Time   02/29/12  4:17 PM      Component Value Range   Uric Acid, Serum 3.9  2.4 - 7.0 (mg/dL)  LACTATE DEHYDROGENASE     Status: Normal   Collection Time   02/29/12  4:17 PM      Component Value Range   LDH 126  94 - 250 (U/L)  URINALYSIS, ROUTINE W REFLEX MICROSCOPIC     Status: Abnormal   Collection Time   02/29/12  4:53 PM      Component Value Range   Color, Urine YELLOW  YELLOW    APPearance CLEAR  CLEAR    Specific Gravity, Urine >1.030 (*) 1.005 - 1.030    pH 6.0  5.0 - 8.0    Glucose, UA NEGATIVE  NEGATIVE (mg/dL)   Hgb urine dipstick NEGATIVE  NEGATIVE    Bilirubin Urine SMALL (*) NEGATIVE    Ketones, ur 15 (*) NEGATIVE (mg/dL)   Protein, ur 30 (*) NEGATIVE (mg/dL)   Urobilinogen, UA 0.2  0.0 - 1.0 (mg/dL)   Nitrite NEGATIVE  NEGATIVE    Leukocytes, UA NEGATIVE  NEGATIVE   URINE MICROSCOPIC-ADD ON     Status: Normal   Collection Time   02/29/12  4:53 PM      Component Value Range   Squamous Epithelial / LPF RARE  RARE    WBC, UA 0-2  <3 (WBC/hpf)   RBC / HPF 0-2  <3 (RBC/hpf)   Urine-Other MUCOUS PRESENT     Pain completely resolved with GI cocktail  Assessment and Plan  36 y.o. G3P1011 at [redacted]w[redacted]d Abd pain - likely GERD or other GI etiology, resolved today with GI cocktail, CMP and CBC WNL Rx ranitidine, rev'd dietary measures F/U as scheduled or sooner PRN  Tome Wilson 02/29/2012, 4:10 PM

## 2012-03-02 ENCOUNTER — Ambulatory Visit (HOSPITAL_COMMUNITY)
Admission: RE | Admit: 2012-03-02 | Discharge: 2012-03-02 | Disposition: A | Discharge status: Home / Self Care | Payer: Self-pay | Source: Ambulatory Visit | Attending: Obstetrics | Admitting: Obstetrics

## 2012-03-02 VITALS — BP 137/84 | HR 92 | Wt 251.0 lb

## 2012-03-02 DIAGNOSIS — O34219 Maternal care for unspecified type scar from previous cesarean delivery: Secondary | ICD-10-CM | POA: Insufficient documentation

## 2012-03-02 DIAGNOSIS — O09529 Supervision of elderly multigravida, unspecified trimester: Secondary | ICD-10-CM | POA: Insufficient documentation

## 2012-03-02 DIAGNOSIS — O24419 Gestational diabetes mellitus in pregnancy, unspecified control: Secondary | ICD-10-CM

## 2012-03-02 DIAGNOSIS — O99810 Abnormal glucose complicating pregnancy: Secondary | ICD-10-CM | POA: Insufficient documentation

## 2012-03-02 DIAGNOSIS — O99210 Obesity complicating pregnancy, unspecified trimester: Secondary | ICD-10-CM | POA: Insufficient documentation

## 2012-03-02 DIAGNOSIS — O10019 Pre-existing essential hypertension complicating pregnancy, unspecified trimester: Secondary | ICD-10-CM | POA: Insufficient documentation

## 2012-03-02 DIAGNOSIS — E669 Obesity, unspecified: Secondary | ICD-10-CM | POA: Insufficient documentation

## 2012-03-02 DIAGNOSIS — O10919 Unspecified pre-existing hypertension complicating pregnancy, unspecified trimester: Secondary | ICD-10-CM

## 2012-03-05 ENCOUNTER — Ambulatory Visit (HOSPITAL_COMMUNITY): Payer: Self-pay

## 2012-03-07 ENCOUNTER — Encounter (HOSPITAL_COMMUNITY): Payer: Self-pay | Admitting: *Deleted

## 2012-03-07 ENCOUNTER — Inpatient Hospital Stay (HOSPITAL_COMMUNITY)
Admission: AD | Admit: 2012-03-07 | Discharge: 2012-03-07 | Disposition: A | Discharge status: Home / Self Care | Payer: Self-pay | Source: Ambulatory Visit | Attending: Obstetrics | Admitting: Obstetrics

## 2012-03-07 DIAGNOSIS — O24419 Gestational diabetes mellitus in pregnancy, unspecified control: Secondary | ICD-10-CM

## 2012-03-07 DIAGNOSIS — N949 Unspecified condition associated with female genital organs and menstrual cycle: Secondary | ICD-10-CM

## 2012-03-07 DIAGNOSIS — O99891 Other specified diseases and conditions complicating pregnancy: Secondary | ICD-10-CM | POA: Insufficient documentation

## 2012-03-07 DIAGNOSIS — R109 Unspecified abdominal pain: Secondary | ICD-10-CM | POA: Insufficient documentation

## 2012-03-07 LAB — URINALYSIS, ROUTINE W REFLEX MICROSCOPIC
Bilirubin Urine: NEGATIVE
Glucose, UA: NEGATIVE mg/dL
Hgb urine dipstick: NEGATIVE
Ketones, ur: NEGATIVE mg/dL
Specific Gravity, Urine: 1.025 (ref 1.005–1.030)
pH: 6 (ref 5.0–8.0)

## 2012-03-07 LAB — URINE MICROSCOPIC-ADD ON

## 2012-03-07 NOTE — Discharge Instructions (Signed)

## 2012-03-07 NOTE — MAU Provider Note (Signed)
Chief Complaint:  Pelvic Pain    First Provider Initiated Contact with Patient 03/07/12 1247      Kylie Baker is  36 y.o. G3P1011.  No LMP recorded. Patient is pregnant.. [redacted]w[redacted]d  She presents complaining of Pelvic Pain  Onset of pelvic pain since last night patient finds it difficult to walk, denies bleeding or discharge no LOF. Denies contractions  Obstetrical/Gynecological History: OB History    Grav Para Term Preterm Abortions TAB SAB Ect Mult Living   3 1 1  0 1 0 1 0 0 1      Past Medical History: Past Medical History  Diagnosis Date  . Pregnancy induced hypertension   . Asthma   . Gestational diabetes   . Gestational diabetes 01/25/2012    Past Surgical History: Past Surgical History  Procedure Date  . Cesarean section   . Appendectomy     Family History: Family History  Problem Relation Age of Onset  . Anesthesia problems Neg Hx   . Hypertension Mother   . Diabetes Father   . Asthma Father     Social History: History  Substance Use Topics  . Smoking status: Never Smoker   . Smokeless tobacco: Never Used  . Alcohol Use: No    Allergies:  Allergies  Allergen Reactions  . Aspirin Itching    Prescriptions prior to admission  Medication Sig Dispense Refill  . labetalol (NORMODYNE) 200 MG tablet Take 200 mg by mouth 3 (three) times daily.      . Prenatal Vit-Fe Fumarate-FA (PRENATAL MULTIVITAMIN) TABS Take 1 tablet by mouth every morning.         Review of Systems - Negative except what has been reviewed in HPI  Physical Exam   Blood pressure 117/75, pulse 89, temperature 99.1 F (37.3 C), resp. rate 16.  General: General appearance - alert, well appearing, and in no distress, oriented to person, place, and time and overweight Mental status - alert, oriented to person, place, and time, normal mood, behavior, speech, dress, motor activity, and thought processes Abdomen - gravid, + tenderness with palpation of right groin Focused Gynecological  Exam: CERVIX: closed/thick/vtx/-2  Labs: Recent Results (from the past 24 hour(s))  URINALYSIS, ROUTINE W REFLEX MICROSCOPIC   Collection Time   03/07/12 12:55 PM      Component Value Range   Color, Urine YELLOW  YELLOW    APPearance CLEAR  CLEAR    Specific Gravity, Urine 1.025  1.005 - 1.030    pH 6.0  5.0 - 8.0    Glucose, UA NEGATIVE  NEGATIVE (mg/dL)   Hgb urine dipstick NEGATIVE  NEGATIVE    Bilirubin Urine NEGATIVE  NEGATIVE    Ketones, ur NEGATIVE  NEGATIVE (mg/dL)   Protein, ur NEGATIVE  NEGATIVE (mg/dL)   Urobilinogen, UA 0.2  0.0 - 1.0 (mg/dL)   Nitrite NEGATIVE  NEGATIVE    Leukocytes, UA TRACE (*) NEGATIVE   URINE MICROSCOPIC-ADD ON   Collection Time   03/07/12 12:55 PM      Component Value Range   Squamous Epithelial / LPF FEW (*) RARE    WBC, UA 0-2  <3 (WBC/hpf)   RBC / HPF 0-2  <3 (RBC/hpf)   Bacteria, UA MANY (*) RARE     MD Consult: Discussed with Dr. Clearance Coots, agrees with plan.  Assessment: Round Ligament Pain Pelvic Pain [redacted]w[redacted]d   Plan: Discharge home Comfort measures reviewed FU in office as scheduled  Kylie Baker E. 03/07/2012,1:33 PM

## 2012-03-07 NOTE — MAU Note (Signed)
Onset of pelvic pain since last night patient finds it difficult to walk, denies bleeding or discharge no LOF.

## 2012-03-09 ENCOUNTER — Ambulatory Visit (HOSPITAL_COMMUNITY)
Admission: RE | Admit: 2012-03-09 | Discharge: 2012-03-09 | Disposition: A | Discharge status: Home / Self Care | Payer: Self-pay | Source: Ambulatory Visit | Attending: Obstetrics | Admitting: Obstetrics

## 2012-03-09 ENCOUNTER — Other Ambulatory Visit (HOSPITAL_COMMUNITY): Payer: Self-pay

## 2012-03-09 ENCOUNTER — Ambulatory Visit (HOSPITAL_COMMUNITY)
Admission: RE | Admit: 2012-03-09 | Discharge: 2012-03-09 | Disposition: A | Discharge status: Home / Self Care | Payer: Self-pay | Source: Ambulatory Visit

## 2012-03-09 DIAGNOSIS — O24419 Gestational diabetes mellitus in pregnancy, unspecified control: Secondary | ICD-10-CM

## 2012-03-09 DIAGNOSIS — O10919 Unspecified pre-existing hypertension complicating pregnancy, unspecified trimester: Secondary | ICD-10-CM

## 2012-03-09 DIAGNOSIS — O10019 Pre-existing essential hypertension complicating pregnancy, unspecified trimester: Secondary | ICD-10-CM | POA: Insufficient documentation

## 2012-03-09 DIAGNOSIS — O99810 Abnormal glucose complicating pregnancy: Secondary | ICD-10-CM | POA: Insufficient documentation

## 2012-03-09 NOTE — ED Notes (Signed)
Patient reports positive fetal movement. Denies contractions, leaking or bleeding.  Does have some occasional pressure. States FBS are running 90-140's.

## 2012-03-09 NOTE — ED Notes (Signed)
Denies HA, VD, atypical edema.

## 2012-03-12 ENCOUNTER — Ambulatory Visit (HOSPITAL_COMMUNITY): Payer: Self-pay

## 2012-03-12 ENCOUNTER — Ambulatory Visit (HOSPITAL_COMMUNITY)
Admission: RE | Admit: 2012-03-12 | Discharge: 2012-03-12 | Disposition: A | Discharge status: Home / Self Care | Payer: Self-pay | Source: Ambulatory Visit | Attending: Obstetrics | Admitting: Obstetrics

## 2012-03-12 ENCOUNTER — Other Ambulatory Visit (HOSPITAL_COMMUNITY): Payer: Self-pay | Admitting: Maternal and Fetal Medicine

## 2012-03-12 VITALS — BP 126/78 | HR 89 | Wt 254.0 lb

## 2012-03-12 DIAGNOSIS — O09529 Supervision of elderly multigravida, unspecified trimester: Secondary | ICD-10-CM | POA: Insufficient documentation

## 2012-03-12 DIAGNOSIS — O24419 Gestational diabetes mellitus in pregnancy, unspecified control: Secondary | ICD-10-CM

## 2012-03-12 DIAGNOSIS — O10919 Unspecified pre-existing hypertension complicating pregnancy, unspecified trimester: Secondary | ICD-10-CM

## 2012-03-12 DIAGNOSIS — O368190 Decreased fetal movements, unspecified trimester, not applicable or unspecified: Secondary | ICD-10-CM | POA: Insufficient documentation

## 2012-03-12 DIAGNOSIS — O99810 Abnormal glucose complicating pregnancy: Secondary | ICD-10-CM | POA: Insufficient documentation

## 2012-03-12 DIAGNOSIS — O10019 Pre-existing essential hypertension complicating pregnancy, unspecified trimester: Secondary | ICD-10-CM | POA: Insufficient documentation

## 2012-03-12 DIAGNOSIS — O99210 Obesity complicating pregnancy, unspecified trimester: Secondary | ICD-10-CM | POA: Insufficient documentation

## 2012-03-12 DIAGNOSIS — O34219 Maternal care for unspecified type scar from previous cesarean delivery: Secondary | ICD-10-CM | POA: Insufficient documentation

## 2012-03-12 DIAGNOSIS — E669 Obesity, unspecified: Secondary | ICD-10-CM | POA: Insufficient documentation

## 2012-03-16 ENCOUNTER — Ambulatory Visit (HOSPITAL_COMMUNITY)
Admission: RE | Admit: 2012-03-16 | Discharge: 2012-03-16 | Disposition: A | Discharge status: Home / Self Care | Payer: Self-pay | Source: Ambulatory Visit | Attending: Obstetrics | Admitting: Obstetrics

## 2012-03-16 ENCOUNTER — Other Ambulatory Visit (HOSPITAL_COMMUNITY): Payer: Self-pay | Admitting: Obstetrics

## 2012-03-16 DIAGNOSIS — O34219 Maternal care for unspecified type scar from previous cesarean delivery: Secondary | ICD-10-CM

## 2012-03-16 DIAGNOSIS — O09529 Supervision of elderly multigravida, unspecified trimester: Secondary | ICD-10-CM

## 2012-03-16 DIAGNOSIS — O10019 Pre-existing essential hypertension complicating pregnancy, unspecified trimester: Secondary | ICD-10-CM

## 2012-03-16 DIAGNOSIS — O99810 Abnormal glucose complicating pregnancy: Secondary | ICD-10-CM

## 2012-03-16 DIAGNOSIS — O10919 Unspecified pre-existing hypertension complicating pregnancy, unspecified trimester: Secondary | ICD-10-CM

## 2012-03-16 DIAGNOSIS — O99891 Other specified diseases and conditions complicating pregnancy: Secondary | ICD-10-CM

## 2012-03-16 DIAGNOSIS — E669 Obesity, unspecified: Secondary | ICD-10-CM | POA: Insufficient documentation

## 2012-03-16 DIAGNOSIS — O368190 Decreased fetal movements, unspecified trimester, not applicable or unspecified: Secondary | ICD-10-CM

## 2012-03-16 DIAGNOSIS — O99210 Obesity complicating pregnancy, unspecified trimester: Secondary | ICD-10-CM | POA: Insufficient documentation

## 2012-03-16 NOTE — Progress Notes (Signed)
Patient seen for follow up BPP.  See report in [facility].  Alpha Gula, MD  Single living intrauterine pregnancy at 35 weeks 1 days. BPP 8/8. Normal amniotic fluid volume.  Continue twice weekly NSTs with weekly AFIs or BPPs.

## 2012-03-19 ENCOUNTER — Ambulatory Visit (HOSPITAL_COMMUNITY)
Admission: RE | Admit: 2012-03-19 | Discharge: 2012-03-19 | Disposition: A | Discharge status: Home / Self Care | Payer: Self-pay | Source: Ambulatory Visit | Attending: Obstetrics | Admitting: Obstetrics

## 2012-03-19 ENCOUNTER — Other Ambulatory Visit (HOSPITAL_COMMUNITY): Payer: Self-pay | Admitting: Obstetrics

## 2012-03-19 VITALS — BP 137/89 | HR 89 | Wt 254.0 lb

## 2012-03-19 DIAGNOSIS — E669 Obesity, unspecified: Secondary | ICD-10-CM | POA: Insufficient documentation

## 2012-03-19 DIAGNOSIS — O34219 Maternal care for unspecified type scar from previous cesarean delivery: Secondary | ICD-10-CM | POA: Insufficient documentation

## 2012-03-19 DIAGNOSIS — O24419 Gestational diabetes mellitus in pregnancy, unspecified control: Secondary | ICD-10-CM

## 2012-03-19 DIAGNOSIS — O99810 Abnormal glucose complicating pregnancy: Secondary | ICD-10-CM | POA: Insufficient documentation

## 2012-03-19 DIAGNOSIS — O10919 Unspecified pre-existing hypertension complicating pregnancy, unspecified trimester: Secondary | ICD-10-CM

## 2012-03-19 DIAGNOSIS — O09529 Supervision of elderly multigravida, unspecified trimester: Secondary | ICD-10-CM | POA: Insufficient documentation

## 2012-03-19 DIAGNOSIS — O10019 Pre-existing essential hypertension complicating pregnancy, unspecified trimester: Secondary | ICD-10-CM | POA: Insufficient documentation

## 2012-03-19 DIAGNOSIS — O99210 Obesity complicating pregnancy, unspecified trimester: Secondary | ICD-10-CM | POA: Insufficient documentation

## 2012-03-19 NOTE — Progress Notes (Signed)
Patient seen for follow up BPP.  See report in [facility].  Alpha Gula, MD   Single living intrauterine pregnancy at 35 weeks 4 days. BPP 8/8. Normal amniotic fluid volume.   Continue twice weekly NSTs with weekly AFIs or BPPs.

## 2012-03-23 ENCOUNTER — Other Ambulatory Visit (HOSPITAL_COMMUNITY): Payer: Self-pay

## 2012-03-23 ENCOUNTER — Ambulatory Visit (HOSPITAL_COMMUNITY)
Admission: RE | Admit: 2012-03-23 | Discharge: 2012-03-23 | Disposition: A | Discharge status: Home / Self Care | Payer: Self-pay | Source: Ambulatory Visit | Attending: Obstetrics | Admitting: Obstetrics

## 2012-03-23 ENCOUNTER — Other Ambulatory Visit (HOSPITAL_COMMUNITY): Payer: Self-pay | Admitting: Maternal and Fetal Medicine

## 2012-03-23 VITALS — BP 138/72 | HR 90 | Wt 251.0 lb

## 2012-03-23 DIAGNOSIS — E669 Obesity, unspecified: Secondary | ICD-10-CM | POA: Insufficient documentation

## 2012-03-23 DIAGNOSIS — O99810 Abnormal glucose complicating pregnancy: Secondary | ICD-10-CM | POA: Insufficient documentation

## 2012-03-23 DIAGNOSIS — O10919 Unspecified pre-existing hypertension complicating pregnancy, unspecified trimester: Secondary | ICD-10-CM

## 2012-03-23 DIAGNOSIS — O10019 Pre-existing essential hypertension complicating pregnancy, unspecified trimester: Secondary | ICD-10-CM | POA: Insufficient documentation

## 2012-03-23 DIAGNOSIS — O34219 Maternal care for unspecified type scar from previous cesarean delivery: Secondary | ICD-10-CM | POA: Insufficient documentation

## 2012-03-23 DIAGNOSIS — O09529 Supervision of elderly multigravida, unspecified trimester: Secondary | ICD-10-CM | POA: Insufficient documentation

## 2012-03-23 DIAGNOSIS — O24419 Gestational diabetes mellitus in pregnancy, unspecified control: Secondary | ICD-10-CM

## 2012-03-23 NOTE — ED Notes (Signed)
Dr. Claudean Severance discussed BPP result with pt.  Will follow up on Friday as scheduled.

## 2012-03-23 NOTE — ED Notes (Signed)
Pt to [place] for BPP at this time.

## 2012-03-23 NOTE — Progress Notes (Signed)
Patient seen for follow up BPP.  See report in [facility].  Zong Mcquarrie, MD 

## 2012-03-23 NOTE — ED Notes (Signed)
Pt states pressure since yesterday.  States + FM.  Denies leaking or bleeding.

## 2012-03-25 ENCOUNTER — Other Ambulatory Visit: Payer: Self-pay

## 2012-03-26 ENCOUNTER — Ambulatory Visit (HOSPITAL_COMMUNITY)
Admission: RE | Admit: 2012-03-26 | Discharge: 2012-03-26 | Disposition: A | Discharge status: Home / Self Care | Payer: Self-pay | Source: Ambulatory Visit | Attending: Obstetrics | Admitting: Obstetrics

## 2012-03-26 ENCOUNTER — Encounter (HOSPITAL_COMMUNITY): Payer: Self-pay

## 2012-03-26 ENCOUNTER — Inpatient Hospital Stay (HOSPITAL_COMMUNITY): Payer: Medicaid Other | Admitting: Anesthesiology

## 2012-03-26 ENCOUNTER — Encounter (HOSPITAL_COMMUNITY): Payer: Self-pay | Admitting: *Deleted

## 2012-03-26 ENCOUNTER — Other Ambulatory Visit (HOSPITAL_COMMUNITY): Payer: Self-pay | Admitting: Maternal and Fetal Medicine

## 2012-03-26 ENCOUNTER — Encounter (HOSPITAL_COMMUNITY): Payer: Self-pay | Admitting: Anesthesiology

## 2012-03-26 ENCOUNTER — Inpatient Hospital Stay (HOSPITAL_COMMUNITY)
Admission: AD | Admit: 2012-03-26 | Discharge: 2012-03-31 | DRG: 765 | Disposition: A | Discharge status: Home / Self Care | Payer: Medicaid Other | Source: Ambulatory Visit | Attending: Obstetrics | Admitting: Obstetrics

## 2012-03-26 VITALS — BP 138/81 | HR 86 | Wt 249.0 lb

## 2012-03-26 DIAGNOSIS — O09529 Supervision of elderly multigravida, unspecified trimester: Secondary | ICD-10-CM | POA: Insufficient documentation

## 2012-03-26 DIAGNOSIS — O10919 Unspecified pre-existing hypertension complicating pregnancy, unspecified trimester: Secondary | ICD-10-CM

## 2012-03-26 DIAGNOSIS — O1002 Pre-existing essential hypertension complicating childbirth: Secondary | ICD-10-CM | POA: Diagnosis present

## 2012-03-26 DIAGNOSIS — O99810 Abnormal glucose complicating pregnancy: Secondary | ICD-10-CM | POA: Insufficient documentation

## 2012-03-26 DIAGNOSIS — E669 Obesity, unspecified: Secondary | ICD-10-CM | POA: Insufficient documentation

## 2012-03-26 DIAGNOSIS — O34219 Maternal care for unspecified type scar from previous cesarean delivery: Secondary | ICD-10-CM | POA: Diagnosis present

## 2012-03-26 DIAGNOSIS — O24419 Gestational diabetes mellitus in pregnancy, unspecified control: Secondary | ICD-10-CM

## 2012-03-26 DIAGNOSIS — O10019 Pre-existing essential hypertension complicating pregnancy, unspecified trimester: Secondary | ICD-10-CM | POA: Insufficient documentation

## 2012-03-26 DIAGNOSIS — O99814 Abnormal glucose complicating childbirth: Secondary | ICD-10-CM | POA: Diagnosis present

## 2012-03-26 LAB — COMPREHENSIVE METABOLIC PANEL
AST: 15 U/L (ref 0–37)
Albumin: 3.1 g/dL — ABNORMAL LOW (ref 3.5–5.2)
Alkaline Phosphatase: 218 U/L — ABNORMAL HIGH (ref 39–117)
BUN: 6 mg/dL (ref 6–23)
CO2: 24 mEq/L (ref 19–32)
Chloride: 104 mEq/L (ref 96–112)
Creatinine, Ser: 0.46 mg/dL — ABNORMAL LOW (ref 0.50–1.10)
GFR calc non Af Amer: >90 mL/min (ref >90)
Potassium: 4.6 mEq/L (ref 3.5–5.1)
Total Bilirubin: 0.5 mg/dL (ref 0.3–1.2)

## 2012-03-26 LAB — CBC
HCT: 36.5 % (ref 36.0–46.0)
MCV: 73.3 fL — ABNORMAL LOW (ref 78.0–100.0)
RBC: 4.98 MIL/uL (ref 3.87–5.11)
RDW: 15.7 % — ABNORMAL HIGH (ref 11.5–15.5)
WBC: 7.4 10*3/uL (ref 4.0–10.5)

## 2012-03-26 LAB — PLATELET COUNT: Platelets: 122 10*3/uL — ABNORMAL LOW (ref 150–400)

## 2012-03-26 MED ORDER — ONDANSETRON HCL 4 MG/2ML IJ SOLN: 4.0000 mg | Freq: Four times a day (QID) | INTRAMUSCULAR | Status: DC | PRN

## 2012-03-26 MED ORDER — PENICILLIN G POTASSIUM 5000000 UNITS IJ SOLR
5.0000 10*6.[IU] | Freq: Once | INTRAMUSCULAR | Status: AC
  Administered 2012-03-26: 5 10*6.[IU] via INTRAVENOUS
  Filled 2012-03-26: qty 5

## 2012-03-26 MED ORDER — EPHEDRINE 5 MG/ML INJ: 10.0000 mg | INTRAVENOUS | Status: DC | PRN

## 2012-03-26 MED ORDER — IBUPROFEN 600 MG PO TABS: 600.0000 mg | ORAL_TABLET | Freq: Four times a day (QID) | ORAL | Status: DC | PRN

## 2012-03-26 MED ORDER — LIDOCAINE HCL (PF) 1 % IJ SOLN: 30.0000 mL | INTRAMUSCULAR | Status: DC | PRN

## 2012-03-26 MED ORDER — LACTATED RINGERS IV SOLN
500.0000 mL | INTRAVENOUS | Status: DC | PRN
  Administered 2012-03-26: 500 mL via INTRAVENOUS

## 2012-03-26 MED ORDER — OXYTOCIN 20 UNITS IN LACTATED RINGERS INFUSION - SIMPLE: 125.0000 mL/h | Freq: Once | INTRAVENOUS | Status: DC

## 2012-03-26 MED ORDER — BUTORPHANOL TARTRATE 2 MG/ML IJ SOLN
1.0000 mg | INTRAMUSCULAR | Status: DC | PRN
  Administered 2012-03-26 – 2012-03-27 (×4): 1 mg via INTRAVENOUS
  Filled 2012-03-26 (×5): qty 1

## 2012-03-26 MED ORDER — CITRIC ACID-SODIUM CITRATE 334-500 MG/5ML PO SOLN
30.0000 mL | ORAL | Status: DC | PRN
  Administered 2012-03-27: 30 mL via ORAL
  Filled 2012-03-26: qty 15

## 2012-03-26 MED ORDER — OXYTOCIN BOLUS FROM INFUSION
500.0000 mL | Freq: Once | INTRAVENOUS | Status: DC
  Filled 2012-03-26: qty 500

## 2012-03-26 MED ORDER — EPHEDRINE 5 MG/ML INJ
10.0000 mg | INTRAVENOUS | Status: DC | PRN
Start: 2012-03-26 — End: 2012-03-27
  Filled 2012-03-26: qty 4

## 2012-03-26 MED ORDER — FLEET ENEMA 7-19 GM/118ML RE ENEM: 1.0000 | ENEMA | RECTAL | Status: DC | PRN

## 2012-03-26 MED ORDER — TERBUTALINE SULFATE 1 MG/ML IJ SOLN: 0.2500 mg | Freq: Once | INTRAMUSCULAR | Status: AC | PRN

## 2012-03-26 MED ORDER — DIPHENHYDRAMINE HCL 50 MG/ML IJ SOLN: 12.5000 mg | INTRAMUSCULAR | Status: DC | PRN

## 2012-03-26 MED ORDER — PHENYLEPHRINE 40 MCG/ML (10ML) SYRINGE FOR IV PUSH (FOR BLOOD PRESSURE SUPPORT)
80.0000 ug | PREFILLED_SYRINGE | INTRAVENOUS | Status: DC | PRN
  Filled 2012-03-26: qty 5

## 2012-03-26 MED ORDER — ACETAMINOPHEN 325 MG PO TABS: 650.0000 mg | ORAL_TABLET | ORAL | Status: DC | PRN

## 2012-03-26 MED ORDER — OXYTOCIN 20 UNITS IN LACTATED RINGERS INFUSION - SIMPLE
1.0000 m[IU]/min | INTRAVENOUS | Status: DC
  Administered 2012-03-26: 2 m[IU]/min via INTRAVENOUS
  Filled 2012-03-26: qty 1000

## 2012-03-26 MED ORDER — PENICILLIN G POTASSIUM 5000000 UNITS IJ SOLR
2.5000 10*6.[IU] | INTRAVENOUS | Status: DC
  Administered 2012-03-26 – 2012-03-27 (×7): 2.5 10*6.[IU] via INTRAVENOUS
  Filled 2012-03-26 (×12): qty 2.5

## 2012-03-26 MED ORDER — LACTATED RINGERS IV SOLN
INTRAVENOUS | Status: DC
  Administered 2012-03-26 (×2): via INTRAVENOUS
  Administered 2012-03-27: 125 mL/h via INTRAVENOUS
  Administered 2012-03-27: 06:00:00 via INTRAVENOUS

## 2012-03-26 MED ORDER — OXYCODONE-ACETAMINOPHEN 5-325 MG PO TABS: 1.0000 | ORAL_TABLET | ORAL | Status: DC | PRN

## 2012-03-26 MED ORDER — LACTATED RINGERS IV SOLN
500.0000 mL | Freq: Once | INTRAVENOUS | Status: AC
  Administered 2012-03-27: 500 mL via INTRAVENOUS

## 2012-03-26 MED ORDER — LABETALOL HCL 200 MG PO TABS
200.0000 mg | ORAL_TABLET | Freq: Three times a day (TID) | ORAL | Status: DC
  Administered 2012-03-26 – 2012-03-31 (×15): 200 mg via ORAL
  Filled 2012-03-26 (×21): qty 1

## 2012-03-26 MED ORDER — FENTANYL 2.5 MCG/ML BUPIVACAINE 1/10 % EPIDURAL INFUSION (WH - ANES)
14.0000 mL/h | INTRAMUSCULAR | Status: DC
  Administered 2012-03-27: 14 mL/h via EPIDURAL
  Filled 2012-03-26 (×2): qty 60

## 2012-03-26 MED ORDER — PHENYLEPHRINE 40 MCG/ML (10ML) SYRINGE FOR IV PUSH (FOR BLOOD PRESSURE SUPPORT): 80.0000 ug | PREFILLED_SYRINGE | INTRAVENOUS | Status: DC | PRN

## 2012-03-26 NOTE — Progress Notes (Signed)
See report in [facility].  Alpha Gula, MD  Single IUP at 36 4/7 weeks BPP performed due to NR NST Oligohydramnios noted with an AFI of 4.3 cm BPP of 4/10 noted (-2 for NST, -2 for tone, -2 for movement)  Patient was transferred to L&D for monitoring.  Recommend moving toward delivery based on oligohydramnios and BPP of 4/10. Findings and recommendations discussed wtih Dr. Gaynell Face.

## 2012-03-26 NOTE — Anesthesia Preprocedure Evaluation (Addendum)
Anesthesia Evaluation  Patient identified by MRN, date of birth, ID band Patient awake    Reviewed: Allergy & Precautions, H&P , Patient's Chart, lab work & pertinent test results  Airway Mallampati: III TM Distance: >3 FB Neck ROM: full    Dental No notable dental hx.    Pulmonary neg pulmonary ROS, asthma ,  breath sounds clear to auscultation  Pulmonary exam normal       Cardiovascular hypertension, negative cardio ROS  Rhythm:regular Rate:Normal     Neuro/Psych negative neurological ROS  negative psych ROS   GI/Hepatic negative GI ROS, Neg liver ROS,   Endo/Other  negative endocrine ROSDiabetes mellitus-Morbid obesity  Renal/GU negative Renal ROS     Musculoskeletal   Abdominal   Peds  Hematology negative hematology ROS (+)   Anesthesia Other Findings   Reproductive/Obstetrics (+) Pregnancy                           Anesthesia Physical  Anesthesia Plan  ASA: III  Anesthesia Plan: Epidural   Post-op Pain Management:    Induction:   Airway Management Planned:   Additional Equipment:   Intra-op Plan:   Post-operative Plan:   Informed Consent: I have reviewed the patients History and Physical, chart, labs and discussed the procedure including the risks, benefits and alternatives for the proposed anesthesia with the patient or authorized representative who has indicated his/her understanding and acceptance.     Plan Discussed with:   Anesthesia Plan Comments:         Anesthesia Quick Evaluation  

## 2012-03-26 NOTE — Progress Notes (Signed)
Pt repositioned to left tilt

## 2012-03-26 NOTE — ED Notes (Signed)
Pt to be direct admitted to [facility].  Spoke with Urban Gibson, RN birthing suites, pt to go to room 170.  Pt taken to MAU registration desk via wheelchair.

## 2012-03-26 NOTE — H&P (Signed)
This is Dr. Francoise Ceo dictating the history and physical on  Kylie Baker she is a 36 year old gravida 3 para 1011 with a previous C-section for CPD the patient has a history of hypertension diabetes and decreased platelets and has been followed by MFM percent she is 36 weeks and 4 days her due date 04/19/2012 she was seen by Dr. Harlon Flor today and had a BPP [phone] days ago it was 8 over 8 and her AFI today was 4 and he recommended delivered the patient states that she takes herb sugar results to MFM on a weekly basis and at the present taken from her her diabetes has been controlled on diet but the results are not available her sugar on admission was 93 her platelets today 125 blood pressure normal she's been controlled on labetalol 200 mg every 8 hours she's been followed MFM with weekly ultrasounds and nonstress tests and she's in for delivered her cervix is closed vertex is at -3 station she started on low-dose Pitocin her GBS was done yesterday so results unknown and she will be treated with penicillin Past medical history gestational diabetes controlled with diet Past surgical history C-section for macrosomia Social history negative System review negative Physical exam revealed an obese female with irregular contractions HEENT negative Breasts negative Lungs clear to P&A Heart regular rhythm no murmurs no gallops Abdomen 36 week size estimated fetal weight 6 lbs. 4 oz. Pelvic as described above extremities negative

## 2012-03-26 NOTE — Progress Notes (Signed)
Dr. Gaynell Face notified of fhr strip---string of variables and late decelerations and response to interventions.  Continue to monitor.

## 2012-03-26 NOTE — Progress Notes (Signed)
Patient ID: Kylie Baker, female   DOB: [birth year], 36 y.o.   MRN: [id number] Dr. Harlon Flor called and said that her AFI was 4 today and you should proceed with delivery

## 2012-03-27 ENCOUNTER — Encounter (HOSPITAL_COMMUNITY)
Admission: AD | Disposition: A | Discharge status: Home / Self Care | Payer: Self-pay | Source: Ambulatory Visit | Attending: Obstetrics

## 2012-03-27 ENCOUNTER — Encounter (HOSPITAL_COMMUNITY): Payer: Self-pay | Admitting: Anesthesiology

## 2012-03-27 ENCOUNTER — Inpatient Hospital Stay (HOSPITAL_COMMUNITY): Payer: Medicaid Other | Admitting: Anesthesiology

## 2012-03-27 LAB — GLUCOSE, CAPILLARY: Glucose-Capillary: 82 mg/dL (ref 70–99)

## 2012-03-27 LAB — CBC
Platelets: 120 10*3/uL — ABNORMAL LOW (ref 150–400)
RBC: 5.07 MIL/uL (ref 3.87–5.11)
RDW: 15.7 % — ABNORMAL HIGH (ref 11.5–15.5)
WBC: 9.7 10*3/uL (ref 4.0–10.5)

## 2012-03-27 LAB — RPR: RPR Ser Ql: NONREACTIVE

## 2012-03-27 SURGERY — Surgical Case
Anesthesia: Epidural | Site: Abdomen | Wound class: Clean Contaminated

## 2012-03-27 MED ORDER — PHENYLEPHRINE 40 MCG/ML (10ML) SYRINGE FOR IV PUSH (FOR BLOOD PRESSURE SUPPORT)
PREFILLED_SYRINGE | INTRAVENOUS | Status: AC
  Filled 2012-03-27: qty 10

## 2012-03-27 MED ORDER — FENTANYL CITRATE 0.05 MG/ML IJ SOLN
INTRAMUSCULAR | Status: AC
  Filled 2012-03-27: qty 2

## 2012-03-27 MED ORDER — FENTANYL 2.5 MCG/ML BUPIVACAINE 1/10 % EPIDURAL INFUSION (WH - ANES)
INTRAMUSCULAR | Status: DC | PRN
  Administered 2012-03-27: 13 mL/h via EPIDURAL

## 2012-03-27 MED ORDER — BUTORPHANOL TARTRATE 2 MG/ML IJ SOLN
2.0000 mg | Freq: Once | INTRAMUSCULAR | Status: AC
  Administered 2012-03-27: 2 mg via INTRAVENOUS

## 2012-03-27 MED ORDER — OXYTOCIN 10 UNIT/ML IJ SOLN
INTRAMUSCULAR | Status: AC
  Filled 2012-03-27: qty 4

## 2012-03-27 MED ORDER — ONDANSETRON HCL 4 MG/2ML IJ SOLN
INTRAMUSCULAR | Status: AC
  Filled 2012-03-27: qty 2

## 2012-03-27 MED ORDER — CEFAZOLIN SODIUM 1-5 GM-% IV SOLN
1.0000 g | Freq: Three times a day (TID) | INTRAVENOUS | Status: DC
  Administered 2012-03-27 – 2012-03-28 (×4): 1 g via INTRAVENOUS
  Filled 2012-03-27 (×6): qty 50

## 2012-03-27 MED ORDER — SODIUM BICARBONATE 8.4 % IV SOLN
INTRAVENOUS | Status: DC | PRN
  Administered 2012-03-27: 10 mL via EPIDURAL

## 2012-03-27 MED ORDER — LIDOCAINE HCL (PF) 1 % IJ SOLN
INTRAMUSCULAR | Status: DC | PRN
  Administered 2012-03-27 (×2): 8 mL

## 2012-03-27 MED ORDER — LACTATED RINGERS IV SOLN
INTRAVENOUS | Status: DC
  Administered 2012-03-27: 125 mL/h via INTRAUTERINE

## 2012-03-27 MED ORDER — SODIUM BICARBONATE 8.4 % IV SOLN
INTRAVENOUS | Status: AC
  Filled 2012-03-27: qty 50

## 2012-03-27 MED ORDER — MORPHINE SULFATE 0.5 MG/ML IJ SOLN
INTRAMUSCULAR | Status: AC
  Filled 2012-03-27: qty 10

## 2012-03-27 MED ORDER — LIDOCAINE-EPINEPHRINE (PF) 2 %-1:200000 IJ SOLN
INTRAMUSCULAR | Status: AC
  Filled 2012-03-27: qty 20

## 2012-03-27 SURGICAL SUPPLY — 27 items
CLOTH BEACON ORANGE TIMEOUT ST (SAFETY) ×2 IMPLANT
DERMABOND ADVANCED (GAUZE/BANDAGES/DRESSINGS) ×1
DERMABOND ADVANCED .7 DNX12 (GAUZE/BANDAGES/DRESSINGS) ×1 IMPLANT
ELECT REM PT RETURN 9FT ADLT (ELECTROSURGICAL) ×2
ELECTRODE REM PT RTRN 9FT ADLT (ELECTROSURGICAL) ×1 IMPLANT
EXTRACTOR VACUUM M CUP 4 TUBE (SUCTIONS) IMPLANT
GLOVE BIO SURGEON STRL SZ8.5 (GLOVE) ×4 IMPLANT
GOWN PREVENTION PLUS LG XLONG (DISPOSABLE) ×4 IMPLANT
GOWN PREVENTION PLUS XXLARGE (GOWN DISPOSABLE) ×2 IMPLANT
KIT ABG SYR 3ML LUER SLIP (SYRINGE) IMPLANT
NEEDLE HYPO 25X5/8 SAFETYGLIDE (NEEDLE) IMPLANT
NS IRRIG 1000ML POUR BTL (IV SOLUTION) ×2 IMPLANT
PACK C SECTION WH (CUSTOM PROCEDURE TRAY) ×2 IMPLANT
SLEEVE SCD COMPRESS KNEE MED (MISCELLANEOUS) ×2 IMPLANT
SUT CHROMIC 0 CT 802H (SUTURE) ×2 IMPLANT
SUT CHROMIC 1 CTX 36 (SUTURE) ×4 IMPLANT
SUT CHROMIC 2 0 SH (SUTURE) ×2 IMPLANT
SUT GUT PLAIN 0 CT-3 TAN 27 (SUTURE) IMPLANT
SUT MON AB 4-0 PS1 27 (SUTURE) ×2 IMPLANT
SUT PLAIN 2 0 XLH (SUTURE) ×2 IMPLANT
SUT VIC AB 0 CT1 18XCR BRD8 (SUTURE) IMPLANT
SUT VIC AB 0 CT1 8-18 (SUTURE)
SUT VIC AB 0 CTX 36 (SUTURE) ×2
SUT VIC AB 0 CTX36XBRD ANBCTRL (SUTURE) ×2 IMPLANT
TOWEL OR 17X24 6PK STRL BLUE (TOWEL DISPOSABLE) ×4 IMPLANT
TRAY FOLEY CATH 14FR (SET/KITS/TRAYS/PACK) IMPLANT
WATER STERILE IRR 1000ML POUR (IV SOLUTION) IMPLANT

## 2012-03-27 NOTE — Progress Notes (Signed)
Patient ID: Kylie Baker, female   DOB: 1976/05/11, 36 y.o.   MRN: [id number] Patient is still 2 cm 80% vertex -3 station she had amnioinfusion him but now has resumed Lipitor parables and late decelerations and she will be delivered by C-section for failure to progress in labor and nonreassuring fetal heart rate tracing

## 2012-03-27 NOTE — Progress Notes (Signed)
Patient ID: Kylie Baker, female   DOB: 1975-11-09, 36 y.o.   MRN: [id number] Cervix fingertip to 1 cm 70% vertex -3 amniotomy performed fluid clear she is on low-dose Pitocin blood pressure normal

## 2012-03-27 NOTE — Anesthesia Preprocedure Evaluation (Signed)
Anesthesia Evaluation  Patient identified by MRN, date of birth, ID band Patient awake    Reviewed: Allergy & Precautions, H&P , Patient's Chart, lab work & pertinent test results  Airway Mallampati: III TM Distance: >3 FB Neck ROM: full    Dental No notable dental hx.    Pulmonary neg pulmonary ROS, asthma ,  breath sounds clear to auscultation  Pulmonary exam normal       Cardiovascular hypertension, negative cardio ROS  Rhythm:regular Rate:Normal     Neuro/Psych negative neurological ROS  negative psych ROS   GI/Hepatic negative GI ROS, Neg liver ROS,   Endo/Other  negative endocrine ROSDiabetes mellitus-Morbid obesity  Renal/GU negative Renal ROS     Musculoskeletal   Abdominal   Peds  Hematology negative hematology ROS (+)   Anesthesia Other Findings   Reproductive/Obstetrics (+) Pregnancy                           Anesthesia Physical  Anesthesia Plan  ASA: III  Anesthesia Plan: Epidural   Post-op Pain Management:    Induction:   Airway Management Planned:   Additional Equipment:   Intra-op Plan:   Post-operative Plan:   Informed Consent: I have reviewed the patients History and Physical, chart, labs and discussed the procedure including the risks, benefits and alternatives for the proposed anesthesia with the patient or authorized representative who has indicated his/her understanding and acceptance.     Plan Discussed with:   Anesthesia Plan Comments:         Anesthesia Quick Evaluation

## 2012-03-27 NOTE — Anesthesia Procedure Notes (Signed)
Epidural Patient location during procedure: OB Start time: 03/27/2012 5:05 PM End time: 03/27/2012 5:10 PM Reason for block: procedure for pain  Staffing Anesthesiologist: Sandrea Hughs  Preanesthetic Checklist Completed: patient identified, site marked, surgical consent, pre-op evaluation, timeout performed, IV checked, risks and benefits discussed and monitors and equipment checked  Epidural Patient position: sitting Prep: site prepped and draped and DuraPrep Patient monitoring: continuous pulse ox and blood pressure Approach: midline Injection technique: LOR air  Needle:  Needle type: Tuohy  Needle gauge: 17 G Needle length: 9 cm Needle insertion depth: 7 cm Catheter type: closed end flexible Catheter size: 19 Gauge Catheter at skin depth: 13 cm Test dose: negative and Other  Assessment Sensory level: T8 Events: blood not aspirated, injection not painful, no injection resistance, negative IV test and no paresthesia

## 2012-03-28 ENCOUNTER — Encounter (HOSPITAL_COMMUNITY): Payer: Self-pay | Admitting: *Deleted

## 2012-03-28 LAB — CBC
HCT: 34.8 % — ABNORMAL LOW (ref 36.0–46.0)
HCT: 33.1 % — ABNORMAL LOW (ref 36.0–46.0)
Hemoglobin: 9.6 g/dL — ABNORMAL LOW (ref 12.0–15.0)
MCHC: 28.7 g/dL — ABNORMAL LOW (ref 30.0–36.0)
MCV: 73.1 fL — ABNORMAL LOW (ref 78.0–100.0)
Platelets: 110 10*3/uL — ABNORMAL LOW (ref 150–400)
RDW: 15.7 % — ABNORMAL HIGH (ref 11.5–15.5)
RDW: 15.7 % — ABNORMAL HIGH (ref 11.5–15.5)
WBC: 10.9 10*3/uL — ABNORMAL HIGH (ref 4.0–10.5)

## 2012-03-28 MED ORDER — 0.9 % SODIUM CHLORIDE (POUR BTL) OPTIME
TOPICAL | Status: DC | PRN
  Administered 2012-03-28: 1000 mL

## 2012-03-28 MED ORDER — SIMETHICONE 80 MG PO CHEW: 80.0000 mg | CHEWABLE_TABLET | ORAL | Status: DC | PRN

## 2012-03-28 MED ORDER — OXYTOCIN 10 UNIT/ML IJ SOLN
20.0000 [IU] | INTRAVENOUS | Status: DC | PRN
  Administered 2012-03-28: 20 [IU] via INTRAVENOUS

## 2012-03-28 MED ORDER — FENTANYL CITRATE 0.05 MG/ML IJ SOLN
INTRAMUSCULAR | Status: DC | PRN
  Administered 2012-03-28: 100 ug via INTRAVENOUS

## 2012-03-28 MED ORDER — DIPHENHYDRAMINE HCL 25 MG PO CAPS: 25.0000 mg | ORAL_CAPSULE | ORAL | Status: DC | PRN

## 2012-03-28 MED ORDER — SCOPOLAMINE 1 MG/3DAYS TD PT72
MEDICATED_PATCH | TRANSDERMAL | Status: AC
  Filled 2012-03-28: qty 1

## 2012-03-28 MED ORDER — OXYCODONE-ACETAMINOPHEN 5-325 MG PO TABS
1.0000 | ORAL_TABLET | ORAL | Status: DC | PRN
  Administered 2012-03-28 – 2012-03-29 (×3): 1 via ORAL
  Administered 2012-03-29: 2 via ORAL
  Administered 2012-03-30: 1 via ORAL
  Administered 2012-03-30: 2 via ORAL
  Administered 2012-03-30 – 2012-03-31 (×4): 1 via ORAL
  Filled 2012-03-28: qty 2
  Filled 2012-03-28 (×4): qty 1
  Filled 2012-03-28: qty 2
  Filled 2012-03-28: qty 1
  Filled 2012-03-28: qty 2
  Filled 2012-03-28 (×2): qty 1

## 2012-03-28 MED ORDER — MEPERIDINE HCL 25 MG/ML IJ SOLN: 6.2500 mg | INTRAMUSCULAR | Status: DC | PRN

## 2012-03-28 MED ORDER — TETANUS-DIPHTH-ACELL PERTUSSIS 5-2.5-18.5 LF-MCG/0.5 IM SUSP
0.5000 mL | Freq: Once | INTRAMUSCULAR | Status: AC
  Administered 2012-03-29: 0.5 mL via INTRAMUSCULAR

## 2012-03-28 MED ORDER — MENTHOL 3 MG MT LOZG: 1.0000 | LOZENGE | OROMUCOSAL | Status: DC | PRN

## 2012-03-28 MED ORDER — OXYTOCIN 10 UNIT/ML IJ SOLN
INTRAMUSCULAR | Status: AC
  Filled 2012-03-28: qty 4

## 2012-03-28 MED ORDER — MORPHINE SULFATE 0.5 MG/ML IJ SOLN
INTRAMUSCULAR | Status: AC
  Filled 2012-03-28: qty 10

## 2012-03-28 MED ORDER — NALBUPHINE HCL 10 MG/ML IJ SOLN
5.0000 mg | INTRAMUSCULAR | Status: DC | PRN
  Filled 2012-03-28: qty 1

## 2012-03-28 MED ORDER — METOCLOPRAMIDE HCL 5 MG/ML IJ SOLN: 10.0000 mg | Freq: Three times a day (TID) | INTRAMUSCULAR | Status: DC | PRN

## 2012-03-28 MED ORDER — PROMETHAZINE HCL 25 MG/ML IJ SOLN: 6.2500 mg | INTRAMUSCULAR | Status: DC | PRN

## 2012-03-28 MED ORDER — LANOLIN HYDROUS EX OINT: 1.0000 "application " | TOPICAL_OINTMENT | CUTANEOUS | Status: DC | PRN

## 2012-03-28 MED ORDER — DIPHENHYDRAMINE HCL 50 MG/ML IJ SOLN: 12.5000 mg | INTRAMUSCULAR | Status: DC | PRN

## 2012-03-28 MED ORDER — HYDROMORPHONE HCL PF 1 MG/ML IJ SOLN
0.2500 mg | INTRAMUSCULAR | Status: DC | PRN
  Administered 2012-03-28 (×4): 0.5 mg via INTRAVENOUS

## 2012-03-28 MED ORDER — WITCH HAZEL-GLYCERIN EX PADS: 1.0000 "application " | MEDICATED_PAD | CUTANEOUS | Status: DC | PRN

## 2012-03-28 MED ORDER — MORPHINE SULFATE (PF) 0.5 MG/ML IJ SOLN
INTRAMUSCULAR | Status: DC | PRN
  Administered 2012-03-28: 1 mg via INTRAVENOUS

## 2012-03-28 MED ORDER — IBUPROFEN 600 MG PO TABS
600.0000 mg | ORAL_TABLET | Freq: Four times a day (QID) | ORAL | Status: DC
  Administered 2012-03-28 – 2012-03-31 (×12): 600 mg via ORAL
  Filled 2012-03-28 (×13): qty 1

## 2012-03-28 MED ORDER — DIBUCAINE 1 % RE OINT: 1.0000 "application " | TOPICAL_OINTMENT | RECTAL | Status: DC | PRN

## 2012-03-28 MED ORDER — IBUPROFEN 600 MG PO TABS: 600.0000 mg | ORAL_TABLET | Freq: Four times a day (QID) | ORAL | Status: DC

## 2012-03-28 MED ORDER — FENTANYL CITRATE 0.05 MG/ML IJ SOLN
INTRAMUSCULAR | Status: AC
  Filled 2012-03-28: qty 2

## 2012-03-28 MED ORDER — HYDROMORPHONE HCL PF 1 MG/ML IJ SOLN
INTRAMUSCULAR | Status: AC
  Administered 2012-03-28: 0.5 mg via INTRAVENOUS
  Filled 2012-03-28: qty 1

## 2012-03-28 MED ORDER — SCOPOLAMINE 1 MG/3DAYS TD PT72
1.0000 | MEDICATED_PATCH | Freq: Once | TRANSDERMAL | Status: DC
  Administered 2012-03-28: 1.5 mg via TRANSDERMAL

## 2012-03-28 MED ORDER — NALBUPHINE HCL 10 MG/ML IJ SOLN
5.0000 mg | INTRAMUSCULAR | Status: DC | PRN
Start: 2012-03-28 — End: 2012-03-31
  Filled 2012-03-28: qty 1

## 2012-03-28 MED ORDER — DIPHENHYDRAMINE HCL 25 MG PO CAPS: 25.0000 mg | ORAL_CAPSULE | Freq: Four times a day (QID) | ORAL | Status: DC | PRN

## 2012-03-28 MED ORDER — LACTATED RINGERS IV SOLN
INTRAVENOUS | Status: DC | PRN
  Administered 2012-03-27 – 2012-03-28 (×2): via INTRAVENOUS

## 2012-03-28 MED ORDER — ONDANSETRON HCL 4 MG/2ML IJ SOLN: 4.0000 mg | INTRAMUSCULAR | Status: DC | PRN

## 2012-03-28 MED ORDER — OXYTOCIN 20 UNITS IN LACTATED RINGERS INFUSION - SIMPLE
125.0000 mL/h | INTRAVENOUS | Status: AC
  Filled 2012-03-28: qty 1000

## 2012-03-28 MED ORDER — ONDANSETRON HCL 4 MG/2ML IJ SOLN
INTRAMUSCULAR | Status: DC | PRN
  Administered 2012-03-28: 4 mg via INTRAVENOUS

## 2012-03-28 MED ORDER — SODIUM CHLORIDE 0.9 % IJ SOLN: 3.0000 mL | INTRAMUSCULAR | Status: DC | PRN

## 2012-03-28 MED ORDER — DIPHENHYDRAMINE HCL 50 MG/ML IJ SOLN
25.0000 mg | INTRAMUSCULAR | Status: DC | PRN
  Administered 2012-03-28: 25 mg via INTRAMUSCULAR
  Filled 2012-03-28: qty 1

## 2012-03-28 MED ORDER — ZOLPIDEM TARTRATE 5 MG PO TABS: 5.0000 mg | ORAL_TABLET | Freq: Every evening | ORAL | Status: DC | PRN

## 2012-03-28 MED ORDER — SIMETHICONE 80 MG PO CHEW
80.0000 mg | CHEWABLE_TABLET | Freq: Three times a day (TID) | ORAL | Status: DC
  Administered 2012-03-28 – 2012-03-30 (×9): 80 mg via ORAL

## 2012-03-28 MED ORDER — SODIUM CHLORIDE 0.9 % IV SOLN
1.0000 ug/kg/h | INTRAVENOUS | Status: DC | PRN
  Filled 2012-03-28: qty 2.5

## 2012-03-28 MED ORDER — LACTATED RINGERS IV SOLN
INTRAVENOUS | Status: DC
  Administered 2012-03-28: 06:00:00 via INTRAVENOUS

## 2012-03-28 MED ORDER — SENNOSIDES-DOCUSATE SODIUM 8.6-50 MG PO TABS
2.0000 | ORAL_TABLET | Freq: Every day | ORAL | Status: DC
  Administered 2012-03-28 – 2012-03-30 (×3): 2 via ORAL

## 2012-03-28 MED ORDER — MORPHINE SULFATE (PF) 0.5 MG/ML IJ SOLN
INTRAMUSCULAR | Status: DC | PRN
  Administered 2012-03-28: 4 mg via EPIDURAL

## 2012-03-28 MED ORDER — PRENATAL MULTIVITAMIN CH
1.0000 | ORAL_TABLET | Freq: Every day | ORAL | Status: DC
  Administered 2012-03-28 – 2012-03-30 (×3): 1 via ORAL
  Filled 2012-03-28 (×3): qty 1

## 2012-03-28 MED ORDER — NALOXONE HCL 0.4 MG/ML IJ SOLN: 0.4000 mg | INTRAMUSCULAR | Status: DC | PRN

## 2012-03-28 MED ORDER — ONDANSETRON HCL 4 MG PO TABS: 4.0000 mg | ORAL_TABLET | ORAL | Status: DC | PRN

## 2012-03-28 MED ORDER — ONDANSETRON HCL 4 MG/2ML IJ SOLN: 4.0000 mg | Freq: Three times a day (TID) | INTRAMUSCULAR | Status: DC | PRN

## 2012-03-28 NOTE — Addendum Note (Signed)
Addendum  created 03/28/12 1220 by Earmon Phoenix, CRNA   Modules edited:Notes Section

## 2012-03-28 NOTE — Anesthesia Postprocedure Evaluation (Signed)
Anesthesia Post Note  Patient: Kylie Baker  Procedure(s) Performed: Procedure(s) (LRB): CESAREAN SECTION (N/A)  Anesthesia type: Epidural  Patient location: PACU  Post pain: Pain level controlled  Post assessment: Post-op Vital signs reviewed  Last Vitals:  Filed Vitals:   03/28/12 0115  BP: 145/90  Pulse: 87  Temp:   Resp: 16    Post vital signs: Reviewed  Level of consciousness: awake  Complications: No apparent anesthesia complications

## 2012-03-28 NOTE — Progress Notes (Signed)
LCSW received referral for NICU admission.  Went to room and family had a room full of guests.  I advised SW can follow up at a later time.  Staci Acosta, MSW LCSW, 03/28/2012, 3:45 pm

## 2012-03-28 NOTE — Addendum Note (Signed)
Addendum  created 03/28/12 1927 by Sandrea Hughs., MD   Modules edited:Notes Section

## 2012-03-28 NOTE — Progress Notes (Signed)
To nicu then transfer to 306-tolerated well

## 2012-03-28 NOTE — Transfer of Care (Signed)
Immediate Anesthesia Transfer of Care Note  Patient: Kylie Baker  Procedure(s) Performed: Procedure(s) (LRB): CESAREAN SECTION (N/A)  Patient Location: PACU  Anesthesia Type: Epidural  Level of Consciousness: awake, alert  and oriented  Airway & Oxygen Therapy: Patient Spontanous Breathing  Post-op Assessment: Report given to PACU RN and Post -op Vital signs reviewed and stable  Post vital signs: stable  Complications: No apparent anesthesia complications

## 2012-03-28 NOTE — Anesthesia Postprocedure Evaluation (Signed)
Anesthesia Post Note  Patient: Kylie Baker  Procedure(s) Performed: * No procedures listed *  Anesthesia type: Epidural  Patient location: Mother/Baby  Post pain: Pain level controlled  Post assessment: Post-op Vital signs reviewed  Last Vitals:  Filed Vitals:   03/28/12 1754  BP: 110/75  Pulse: 85  Temp: 37 C  Resp: 18    Post vital signs: Reviewed  Level of consciousness: awake  Complications: No apparent anesthesia complications this was a consult note. The epidural was created with a different chart

## 2012-03-28 NOTE — Anesthesia Postprocedure Evaluation (Signed)
  Anesthesia Post-op Note  Patient: Kylie Baker  Procedure(s) Performed: Procedure(s) (LRB): CESAREAN SECTION (N/A)  Patient Location: [facility]  Anesthesia Type: Epidural  Level of Consciousness: awake, alert  and oriented  Airway and Oxygen Therapy: Patient Spontanous Breathing  Post-op Pain: mild  Post-op Assessment: Patient's Cardiovascular Status Stable and Respiratory Function Stable  Post-op Vital Signs: stable  Complications: No apparent anesthesia complications

## 2012-03-28 NOTE — Op Note (Signed)
preop diagnosis IUP at 36 weeks and 5 days history of chronic hypertension gestational diabetes and low platelets nonreassuring fetal heart rate tracing previous cesarean section failed induction Postop diagnosis same Surgeon Dr. Francoise Ceo Anesthesia epidural Procedure patient placed on the operating table in the supine position abdomen prepped and draped bladder and to the Foley catheter transverse suprapubic incision made through the old scar carried him to the rectus fascia the fascia cleaned and incised the length of the incision recti muscles retracted laterally peritoneum incised longitudinally transverse incision made in the visceroperitoneum above the bladder and the bladder mobilized inferiorly a transverse low uterine incision made patient delivered from the OA position of a female Apgar 8 and 9 the fluid was clear the team was in attendance the placenta was posterior removed manually and sent to pathology uterine cavity clean with dry laps uterine incision closed in one layer with continuous within normal on chromic hemostasis was satisfactory by the flap reattached to a chromic uterus well contracted tubes and ovaries normal abdomen chosen as peritoneum continuous with 2-0 chromic fascia continuous within of 0 Dexon skin shows a subcuticular stitch of 4-0 Monocryl blood loss was 600 cc patient tolerated the procedure well thank you and

## 2012-03-28 NOTE — Progress Notes (Signed)
Dr. Gaynell Face at bedside discussing with pt need for c-section due to failure to progress and non-reassuring FHR, explaining risks and benefits of c-section, pt verbalizes understanding and consents to c-section

## 2012-03-29 ENCOUNTER — Other Ambulatory Visit: Payer: Self-pay | Admitting: Obstetrics

## 2012-03-29 NOTE — Progress Notes (Signed)
Patient ID: Kylie Baker, female   DOB: 1976/10/04, 36 y.o.   MRN: [id number] Postop day 1 Vital signs normal Fundus firm Incision clean and dry Legs negative No complaints

## 2012-03-29 NOTE — Progress Notes (Signed)
Ur chart review completed.  

## 2012-03-30 ENCOUNTER — Other Ambulatory Visit (HOSPITAL_COMMUNITY): Payer: Self-pay

## 2012-03-30 NOTE — Progress Notes (Signed)
Patient ID: Kylie Baker, female   DOB: 1976-09-29, 36 y.o.   MRN: [id number] Postop day 2 Vital signs normal Fundus firm Incision clean and dry Legs negative No complaints

## 2012-03-31 NOTE — Discharge Summary (Signed)
Obstetric Discharge Summary Reason for Admission: induction of labor Prenatal Procedures: NST Intrapartum Procedures: cesarean: low cervical, transverse Postpartum Procedures: none Complications-Operative and Postpartum: none Hemoglobin  Date Value Range Status  03/28/2012 9.6* 12.0 - 15.0 g/dL Final     HCT  Date Value Range Status  03/28/2012 33.1* 36.0 - 46.0 % Final    Physical Exam:  General: alert Lochia: appropriate Uterine Fundus: firm Incision: healing well DVT Evaluation: No evidence of DVT seen on physical exam.  Discharge Diagnoses: Term Pregnancy-delivered  Discharge Information: Date: 03/31/2012 Activity: pelvic rest Diet: routine Medications: Percocet Condition: stable Instructions: refer to practice specific booklet Discharge to: home Follow-up Information    Follow up with Hoyle Barkdull A, MD. Call in 6 weeks.   Contact information:   [address] [place] [address] [phone]          Newborn Data: Live born female  Birth Weight: 6 lb 3.6 oz (2824 g) APGAR: 8, 9  Home with mother.  Lanore Renderos A 03/31/2012, 7:03 AM

## 2012-03-31 NOTE — Progress Notes (Signed)
Pt is discharged  In the care of friend Mayo Ao per ambulatory with infant in  Car seat. Stable. . Abdominal incision is clean and dry  Scanty amt of vaginal bleeding noted on V=pad.

## 2012-03-31 NOTE — Progress Notes (Signed)
Pt comprended all instructions well about  Patients care and infants care.. QUESTIONS ASKED AND ANSWERED.

## 2012-03-31 NOTE — Progress Notes (Signed)
Patient ID: Kylie Baker, female   DOB: [birth year], 36 y.o.   MRN: [id number] Postop day 3 Vital signs normal Fundus firm Incision clean and dry Lochia moderate Legs negative doing well with a discharge home today on Percocet for pain and labetalol 200 mg by mouth every 8 hours her blood pressure

## 2012-03-31 NOTE — Discharge Instructions (Signed)
Discharge instructions   You can wash your hair  Shower  Eat what you want  Drink what you want  See me in 6 weeks  Your ankles are going to swell more in the next 2 weeks than when pregnant  No sex for 6 weeks   Dallas Scorsone A, MD 03/31/2012    

## 2012-04-02 ENCOUNTER — Ambulatory Visit (HOSPITAL_COMMUNITY): Payer: Self-pay

## 2012-04-09 ENCOUNTER — Ambulatory Visit (HOSPITAL_COMMUNITY): Payer: Self-pay

## 2012-04-21 ENCOUNTER — Encounter (HOSPITAL_COMMUNITY): Payer: Self-pay | Admitting: Emergency Medicine

## 2012-04-21 ENCOUNTER — Inpatient Hospital Stay (HOSPITAL_COMMUNITY)
Admission: EM | Admit: 2012-04-21 | Discharge: 2012-04-22 | DRG: 769 | Disposition: A | Discharge status: Home / Self Care | Payer: MEDICAID | Attending: General Surgery | Admitting: General Surgery

## 2012-04-21 ENCOUNTER — Emergency Department (HOSPITAL_COMMUNITY): Payer: Self-pay

## 2012-04-21 DIAGNOSIS — K8010 Calculus of gallbladder with chronic cholecystitis without obstruction: Secondary | ICD-10-CM | POA: Diagnosis present

## 2012-04-21 DIAGNOSIS — K819 Cholecystitis, unspecified: Secondary | ICD-10-CM

## 2012-04-21 DIAGNOSIS — O9089 Other complications of the puerperium, not elsewhere classified: Principal | ICD-10-CM | POA: Diagnosis present

## 2012-04-21 LAB — CBC WITH DIFFERENTIAL/PLATELET
Basophils Relative: 0 % (ref 0–1)
Eosinophils Relative: 2 % (ref 0–5)
HCT: 37.4 % (ref 36.0–46.0)
Hemoglobin: 10.8 g/dL — ABNORMAL LOW (ref 12.0–15.0)
Lymphs Abs: 2 10*3/uL (ref 0.7–4.0)
MCV: 71.5 fL — ABNORMAL LOW (ref 78.0–100.0)
Monocytes Relative: 7 % (ref 3–12)
Neutro Abs: 4.4 10*3/uL (ref 1.7–7.7)
RBC: 5.23 MIL/uL — ABNORMAL HIGH (ref 3.87–5.11)
RDW: 15.8 % — ABNORMAL HIGH (ref 11.5–15.5)
WBC: 7 10*3/uL (ref 4.0–10.5)

## 2012-04-21 LAB — COMPREHENSIVE METABOLIC PANEL
ALT: 15 U/L (ref 0–35)
Alkaline Phosphatase: 127 U/L — ABNORMAL HIGH (ref 39–117)
BUN: 10 mg/dL (ref 6–23)
Chloride: 109 mEq/L (ref 96–112)
GFR calc Af Amer: >90 mL/min (ref >90)
Glucose, Bld: 89 mg/dL (ref 70–99)
Potassium: 4.5 mEq/L (ref 3.5–5.1)
Sodium: 140 mEq/L (ref 135–145)
Total Bilirubin: 0.3 mg/dL (ref 0.3–1.2)

## 2012-04-21 LAB — LIPASE, BLOOD: Lipase: 34 U/L (ref 11–59)

## 2012-04-21 MED ORDER — POTASSIUM CHLORIDE IN NACL 20-0.9 MEQ/L-% IV SOLN
INTRAVENOUS | Status: DC
  Administered 2012-04-21 – 2012-04-22 (×3): via INTRAVENOUS
  Filled 2012-04-21 (×3): qty 1000

## 2012-04-21 MED ORDER — PANTOPRAZOLE SODIUM 40 MG IV SOLR
40.0000 mg | Freq: Every day | INTRAVENOUS | Status: DC
  Administered 2012-04-21: 40 mg via INTRAVENOUS
  Filled 2012-04-21 (×2): qty 40

## 2012-04-21 MED ORDER — ONDANSETRON 4 MG PO TBDP
4.0000 mg | ORAL_TABLET | Freq: Once | ORAL | Status: AC
  Administered 2012-04-21: 4 mg via ORAL
  Filled 2012-04-21: qty 1

## 2012-04-21 MED ORDER — FENTANYL CITRATE 0.05 MG/ML IJ SOLN
100.0000 ug | Freq: Once | INTRAMUSCULAR | Status: AC
  Administered 2012-04-21: 100 ug via INTRAVENOUS
  Filled 2012-04-21: qty 2

## 2012-04-21 MED ORDER — MORPHINE SULFATE 2 MG/ML IJ SOLN
2.0000 mg | INTRAMUSCULAR | Status: DC | PRN
  Administered 2012-04-21: 4 mg via INTRAVENOUS
  Administered 2012-04-21: 2 mg via INTRAVENOUS
  Filled 2012-04-21: qty 1
  Filled 2012-04-21: qty 2

## 2012-04-21 MED ORDER — SODIUM CHLORIDE 0.9 % IV SOLN
1.0000 g | INTRAVENOUS | Status: DC
  Administered 2012-04-21: 1 g via INTRAVENOUS
  Filled 2012-04-21 (×2): qty 1

## 2012-04-21 MED ORDER — HEPARIN SODIUM (PORCINE) 5000 UNIT/ML IJ SOLN
5000.0000 [IU] | Freq: Three times a day (TID) | INTRAMUSCULAR | Status: DC
  Administered 2012-04-21 – 2012-04-22 (×3): 5000 [IU] via SUBCUTANEOUS
  Filled 2012-04-21 (×5): qty 1

## 2012-04-21 MED ORDER — LABETALOL HCL 200 MG PO TABS
200.0000 mg | ORAL_TABLET | Freq: Four times a day (QID) | ORAL | Status: DC
  Administered 2012-04-21 – 2012-04-22 (×3): 200 mg via ORAL
  Filled 2012-04-21 (×5): qty 1

## 2012-04-21 MED ORDER — ONDANSETRON HCL 4 MG/2ML IJ SOLN: 4.0000 mg | Freq: Four times a day (QID) | INTRAMUSCULAR | Status: DC | PRN

## 2012-04-21 NOTE — ED Notes (Signed)
Pt gave birth 3 weeks ago by c-section. Pt reported pain right to medial upper abdomen. Nausea, no vomiting.

## 2012-04-21 NOTE — ED Provider Notes (Signed)
History     CSN: [id number]  Arrival date & time 04/21/12  1445   First MD Initiated Contact with Patient 04/21/12 1519      Chief Complaint  Patient presents with  . Abdominal Pain    (Consider location/radiation/quality/duration/timing/severity/associated sxs/prior treatment) Patient is a 36 y.o. female presenting with abdominal pain. The history is provided by the patient.  Abdominal Pain The primary symptoms of the illness include abdominal pain and nausea. The primary symptoms of the illness do not include fever, shortness of breath, vomiting, diarrhea or dysuria.  Symptoms associated with the illness do not include back pain.   patient's had upper abdominal pain for a day. Is constant. It began taking one of her pills. She's had nausea without vomiting. No diarrhea or constipation. She had a C-section 3 weeks ago. She had had some smell coming from the C-section site. It has improved. No fevers. No weight loss.  Past Medical History  Diagnosis Date  . Pregnancy induced hypertension   . Asthma   . Gestational diabetes   . Gestational diabetes 01/25/2012    Past Surgical History  Procedure Date  . Cesarean section   . Appendectomy   . Cesarean section 03/27/2012    Procedure: CESAREAN SECTION;  Surgeon: Kathreen Cosier, MD;  Location: [facility];  Service: Gynecology;  Laterality: N/A;  Repeat Cesarean Section Delivery   Boy  @ 0005 , Apgars 8/9    Family History  Problem Relation Age of Onset  . Anesthesia problems Neg Hx   . Hypertension Mother   . Diabetes Father   . Asthma Father     History  Substance Use Topics  . Smoking status: Never Smoker   . Smokeless tobacco: Never Used  . Alcohol Use: No    OB History    Grav Para Term Preterm Abortions TAB SAB Ect Mult Living   3 2 1 1 1  0 1 0 0 2      Review of Systems  Constitutional: Negative for fever, activity change and appetite change.  HENT: Negative for neck stiffness.   Eyes: Negative for pain.    Respiratory: Negative for chest tightness and shortness of breath.   Cardiovascular: Negative for chest pain and leg swelling.  Gastrointestinal: Positive for nausea and abdominal pain. Negative for vomiting and diarrhea.  Genitourinary: Negative for dysuria and flank pain.  Musculoskeletal: Negative for back pain.  Skin: Negative for rash.  Neurological: Negative for weakness, numbness and headaches.  Psychiatric/Behavioral: Negative for behavioral problems.    Allergies  Aspirin and Ibuprofen  Home Medications   No current outpatient prescriptions on file.  BP 162/99  Pulse 63  Temp 98.7 F (37.1 C) (Oral)  Resp 17  Ht 5\' 6"  (1.676 m)  Wt 233 lb 3.2 oz (105.779 kg)  BMI 37.64 kg/m2  SpO2 100%  Breastfeeding? Yes  Physical Exam  Nursing note and vitals reviewed. Constitutional: She is oriented to person, place, and time. She appears well-developed and well-nourished.       Patient is obese  HENT:  Head: Normocephalic and atraumatic.  Eyes: EOM are normal. Pupils are equal, round, and reactive to light.  Neck: Normal range of motion. Neck supple.  Cardiovascular: Normal rate, regular rhythm and normal heart sounds.   No murmur heard. Pulmonary/Chest: Effort normal and breath sounds normal. No respiratory distress. She has no wheezes. She has no rales.  Abdominal: Soft. Bowel sounds are normal. She exhibits no distension. There is tenderness. There is  no rebound and no guarding.       Right upper quadrant tenderness without rebound or guarding. No rash. Suprapubic incision site is well healing. There is an approximately 5 mm area of diastases towards the left edge. No drainage. There is no erythema.  Musculoskeletal: Normal range of motion.  Neurological: She is alert and oriented to person, place, and time. No cranial nerve deficit.  Skin: Skin is warm and dry.  Psychiatric: She has a normal mood and affect. Her speech is normal.    ED Course  Procedures (including  critical care time)  Labs Reviewed  CBC WITH DIFFERENTIAL - Abnormal; Notable for the following:    RBC 5.23 (*)     Hemoglobin 10.8 (*)     MCV 71.5 (*)     MCH 20.7 (*)     MCHC 28.9 (*)     RDW 15.8 (*)     All other components within normal limits  COMPREHENSIVE METABOLIC PANEL - Abnormal; Notable for the following:    Calcium 11.6 (*)     Alkaline Phosphatase 127 (*)     All other components within normal limits  LIPASE, BLOOD   US Abdomen Complete  04/21/2012  *RADIOLOGY REPORT*  Clinical Data:  Right upper quadrant pain.  High blood pressure. Diabetes.  COMPLETE ABDOMINAL ULTRASOUND  Comparison:  No comparison abdominal sonogram located.  Findings:  Gallbladder:  Two gallstones measure up to 9.2 mm.  One of the gallstones is mobile.  The other does not move from the gallbladder neck region.  No gallbladder wall thickening or pericholecystic fluid however the patient was tender over the gallbladder while scanning per ultrasound technologist.  Common bile duct:  6.4 mm.  Liver:  1.1 x 1.1 x 1.3 cm echogenic focus within the right lobe liver.  Appearance is suggestive of a hemangioma.  IVC:  Appears normal.  Pancreas:  Slightly limited evaluation secondary to patient's habitus without gross abnormality.  Spleen:  8.8 cm.  No focal mass.  Right Kidney:  12.8 cm. No hydronephrosis or renal mass.  Left Kidney:  12.9 cm. No hydronephrosis or renal mass.  Abdominal aorta:  No aneurysm identified.  IMPRESSION: Two gallstones measure up to 9.2 mm.  One of the gallstones is mobile.  The other does not move from the gallbladder neck region. No gallbladder wall thickening or pericholecystic fluid however the patient was tender over the gallbladder while scanning per ultrasound technologist.  1.1 x 1.1 x 1.3 cm echogenic focus within the right lobe liver. Appearance is suggestive of a hemangioma.  Original Report Authenticated By: Fuller Canada, M.D.     1. Cholecystitis       MDM  Patient with  abdominal pain. Lab work is overall reassuring. She is tenderness over the gallbladder and an immobile gallstone. She was admitted by general surgery for cholecystectomy        Juliet Rude. Rubin Payor, MD 04/21/12 [id number]

## 2012-04-21 NOTE — ED Notes (Signed)
Pt ate two spoons of rice at 1300 but could not eat more due to pain.

## 2012-04-21 NOTE — H&P (Signed)
Kylie Baker is an 36 y.o. female.   Chief Complaint: abdominal pain HPI: patient is a 36 year old female who is 3 weeks post C-section. During her pregnancy about 2 months ago she had an episode of severe epigastric abdominal pain that lasted for several hours and resolved. She had no recurrence until today about 6 hours prior to admission. At that time she had the sudden onset of severe cramping like epigastric abdominal pain. This was associated with nausea. No vomiting. No fever or chills. Compensated normal. The pain persisted and was severe and she presented to the [facility] emergency room. It has only been relieved after pain and nausea medication but is still somewhat present. She has noted GU symptoms. No jaundice noted.  Past Medical History  Diagnosis Date  . Pregnancy induced hypertension   . Asthma   . Gestational diabetes   . Gestational diabetes 01/25/2012    Past Surgical History  Procedure Date  . Cesarean section   . Appendectomy   . Cesarean section 03/27/2012    Procedure: CESAREAN SECTION;  Surgeon: Kathreen Cosier, MD;  Location: [facility];  Service: Gynecology;  Laterality: N/A;  Repeat Cesarean Section Delivery   Boy  @ 0005 , Apgars 8/9   Current Facility-Administered Medications  Medication Dose Route Frequency Provider Last Rate Last Dose  . fentaNYL (SUBLIMAZE) injection 100 mcg  100 mcg Intravenous Once [employer] Pickering, MD   100 mcg at 04/21/12 1730  . ondansetron (ZOFRAN-ODT) disintegrating tablet 4 mg  4 mg Oral Once [employer]. Pickering, MD   4 mg at 04/21/12 1613   Current Outpatient Prescriptions  Medication Sig Dispense Refill  . labetalol (NORMODYNE) 200 MG tablet Take 200 mg by mouth 4 (four) times daily.       Family History  Problem Relation Age of Onset  . Anesthesia problems Neg Hx   . Hypertension Mother   . Diabetes Father   . Asthma Father    Social History:  reports that she has never smoked. She has never used smokeless tobacco. She  reports that she does not drink alcohol or use illicit drugs.  Allergies:  Allergies  Allergen Reactions  . Aspirin Itching  . Ibuprofen Other (See Comments)    GI intolerance     (Not in a hospital admission)  Results for orders placed during the hospital encounter of 04/21/12 (from the past 48 hour(s))  CBC WITH DIFFERENTIAL     Status: Abnormal   Collection Time   04/21/12  3:50 PM      Component Value Range Comment   WBC 7.0  4.0 - 10.5 K/uL    RBC 5.23 (*) 3.87 - 5.11 MIL/uL    Hemoglobin 10.8 (*) 12.0 - 15.0 g/dL    HCT 19.1  47.8 - 29.5 %    MCV 71.5 (*) 78.0 - 100.0 fL    MCH 20.7 (*) 26.0 - 34.0 pg    MCHC 28.9 (*) 30.0 - 36.0 g/dL    RDW 62.1 (*) 30.8 - 15.5 %    Platelets 219  150 - 400 K/uL    Neutrophils Relative 62  43 - 77 %    Lymphocytes Relative 29  12 - 46 %    Monocytes Relative 7  3 - 12 %    Eosinophils Relative 2  0 - 5 %    Basophils Relative 0  0 - 1 %    Neutro Abs 4.4  1.7 - 7.7 K/uL  Lymphs Abs 2.0  0.7 - 4.0 K/uL    Monocytes Absolute 0.5  0.1 - 1.0 K/uL    Eosinophils Absolute 0.1  0.0 - 0.7 K/uL    Basophils Absolute 0.0  0.0 - 0.1 K/uL    WBC Morphology MILD LEFT SHIFT (1-5% METAS, OCC MYELO, OCC BANDS)      Smear Review LARGE PLATELETS PRESENT     COMPREHENSIVE METABOLIC PANEL     Status: Abnormal   Collection Time   04/21/12  3:50 PM      Component Value Range Comment   Sodium 140  135 - 145 mEq/L    Potassium 4.5  3.5 - 5.1 mEq/L    Chloride 109  96 - 112 mEq/L    CO2 25  19 - 32 mEq/L    Glucose, Bld 89  70 - 99 mg/dL    BUN 10  6 - 23 mg/dL    Creatinine, Ser 1.61  0.50 - 1.10 mg/dL    Calcium 09.6 (*) 8.4 - 10.5 mg/dL    Total Protein 7.2  6.0 - 8.3 g/dL    Albumin 3.6  3.5 - 5.2 g/dL    AST 17  0 - 37 U/L    ALT 15  0 - 35 U/L    Alkaline Phosphatase 127 (*) 39 - 117 U/L    Total Bilirubin 0.3  0.3 - 1.2 mg/dL    GFR calc non Af Amer >90  >90 mL/min    GFR calc Af Amer >90  >90 mL/min   LIPASE, BLOOD     Status: Normal    Collection Time   04/21/12  3:50 PM      Component Value Range Comment   Lipase 34  11 - 59 U/L    US Abdomen Complete  04/21/2012  *RADIOLOGY REPORT*  Clinical Data:  Right upper quadrant pain.  High blood pressure. Diabetes.  COMPLETE ABDOMINAL ULTRASOUND  Comparison:  No comparison abdominal sonogram located.  Findings:  Gallbladder:  Two gallstones measure up to 9.2 mm.  One of the gallstones is mobile.  The other does not move from the gallbladder neck region.  No gallbladder wall thickening or pericholecystic fluid however the patient was tender over the gallbladder while scanning per ultrasound technologist.  Common bile duct:  6.4 mm.  Liver:  1.1 x 1.1 x 1.3 cm echogenic focus within the right lobe liver.  Appearance is suggestive of a hemangioma.  IVC:  Appears normal.  Pancreas:  Slightly limited evaluation secondary to patient's habitus without gross abnormality.  Spleen:  8.8 cm.  No focal mass.  Right Kidney:  12.8 cm. No hydronephrosis or renal mass.  Left Kidney:  12.9 cm. No hydronephrosis or renal mass.  Abdominal aorta:  No aneurysm identified.  IMPRESSION: Two gallstones measure up to 9.2 mm.  One of the gallstones is mobile.  The other does not move from the gallbladder neck region. No gallbladder wall thickening or pericholecystic fluid however the patient was tender over the gallbladder while scanning per ultrasound technologist.  1.1 x 1.1 x 1.3 cm echogenic focus within the right lobe liver. Appearance is suggestive of a hemangioma.  Original Report Authenticated By: Fuller Canada, M.D.    Review of Systems  Constitutional: Negative for fever, chills and malaise/fatigue.  Respiratory: Negative for cough, shortness of breath and wheezing.   Cardiovascular: Negative for chest pain, palpitations and leg swelling.  Gastrointestinal: Positive for nausea and abdominal pain. Negative for vomiting, diarrhea, constipation  and blood in stool.  Genitourinary: Negative.     Blood  pressure 172/87, pulse 66, temperature 98.2 F (36.8 C), temperature source Oral, resp. rate 16, height 5\' 6"  (1.676 m), weight 233 lb 3.2 oz (105.779 kg), SpO2 99.00%, currently breastfeeding. Physical Exam  Gen.: Alert moderately obese African American female in no severe distress Skin: Warm and dry without rash or infection HEENT: Moderate facial hair. No palpable masses. Sclera nonicteric. Oropharynx clear. Lungs: Clear without wheezing or increased work of breathing Cardiovascular: Regular rate and rhythm without murmur. No JVD. No edema. Peripheral pulses intact. Abdomen: Healing Pfannenstiel incision. Moderately obese. There is localized epigastric tenderness with some guarding but no peritoneal signs. No discernible masses or organomegaly. No hernias. Extremities: No joint swelling or edema or deformity Neurologic: She is alert and fully oriented. Affect normal. Assessment/Plan Acute severe epigastric pain in the postpartum setting entirely consistent with ongoing biliary colic or acute cholecystitis. Gallbladder ultrasound has confirmed gallstones with one large stone impacted in the neck of the gallbladder. She has continued to have some pain and tenderness after treatment in the emergency room. She will be admitted for pain and nausea control and plan urgent laparoscopic cholecystectomy.  Marijayne Rauth T 04/21/2012, 7:07 PM

## 2012-04-22 ENCOUNTER — Inpatient Hospital Stay (HOSPITAL_COMMUNITY): Payer: Self-pay

## 2012-04-22 ENCOUNTER — Encounter (HOSPITAL_COMMUNITY): Payer: Self-pay | Admitting: *Deleted

## 2012-04-22 ENCOUNTER — Encounter (HOSPITAL_COMMUNITY): Admission: EM | Disposition: A | Discharge status: Home / Self Care | Payer: Self-pay | Source: Home / Self Care

## 2012-04-22 ENCOUNTER — Encounter (HOSPITAL_COMMUNITY): Payer: Self-pay | Admitting: Anesthesiology

## 2012-04-22 ENCOUNTER — Inpatient Hospital Stay (HOSPITAL_COMMUNITY): Payer: Self-pay | Admitting: Anesthesiology

## 2012-04-22 HISTORY — PX: CHOLECYSTECTOMY: SHX55

## 2012-04-22 LAB — MRSA PCR SCREENING: MRSA by PCR: NEGATIVE

## 2012-04-22 SURGERY — LAPAROSCOPIC CHOLECYSTECTOMY WITH INTRAOPERATIVE CHOLANGIOGRAM
Anesthesia: General | Site: Abdomen | Wound class: Clean Contaminated

## 2012-04-22 MED ORDER — FENTANYL CITRATE 0.05 MG/ML IJ SOLN
25.0000 ug | INTRAMUSCULAR | Status: DC | PRN
  Administered 2012-04-22 (×2): 50 ug via INTRAVENOUS

## 2012-04-22 MED ORDER — FENTANYL CITRATE 0.05 MG/ML IJ SOLN
INTRAMUSCULAR | Status: DC | PRN
  Administered 2012-04-22 (×3): 50 ug via INTRAVENOUS
  Administered 2012-04-22: 100 ug via INTRAVENOUS

## 2012-04-22 MED ORDER — METOPROLOL TARTRATE 1 MG/ML IV SOLN: 2.5000 mg | INTRAVENOUS | Status: DC | PRN

## 2012-04-22 MED ORDER — PROPOFOL 10 MG/ML IV BOLUS
INTRAVENOUS | Status: DC | PRN
  Administered 2012-04-22: 200 mg via INTRAVENOUS

## 2012-04-22 MED ORDER — OXYCODONE-ACETAMINOPHEN 5-325 MG PO TABS: 1.0000 | ORAL_TABLET | ORAL | Status: AC | PRN

## 2012-04-22 MED ORDER — IOHEXOL 300 MG/ML  SOLN
INTRAMUSCULAR | Status: AC
  Filled 2012-04-22: qty 1

## 2012-04-22 MED ORDER — NEOSTIGMINE METHYLSULFATE 1 MG/ML IJ SOLN
INTRAMUSCULAR | Status: DC | PRN
  Administered 2012-04-22: 4 mg via INTRAVENOUS

## 2012-04-22 MED ORDER — BUPIVACAINE HCL (PF) 0.25 % IJ SOLN
INTRAMUSCULAR | Status: AC
  Filled 2012-04-22: qty 30

## 2012-04-22 MED ORDER — LACTATED RINGERS IR SOLN
Status: DC | PRN
  Administered 2012-04-22: 1000 mL

## 2012-04-22 MED ORDER — POTASSIUM CHLORIDE IN NACL 20-0.9 MEQ/L-% IV SOLN
INTRAVENOUS | Status: AC
  Filled 2012-04-22: qty 1000

## 2012-04-22 MED ORDER — LACTATED RINGERS IV SOLN
INTRAVENOUS | Status: DC | PRN
  Administered 2012-04-22: 09:00:00 via INTRAVENOUS

## 2012-04-22 MED ORDER — GLYCOPYRROLATE 0.2 MG/ML IJ SOLN
INTRAMUSCULAR | Status: DC | PRN
  Administered 2012-04-22: .5 mg via INTRAVENOUS

## 2012-04-22 MED ORDER — FENTANYL CITRATE 0.05 MG/ML IJ SOLN
INTRAMUSCULAR | Status: DC
Start: 2012-04-22 — End: 2012-04-22
  Filled 2012-04-22: qty 2

## 2012-04-22 MED ORDER — ROCURONIUM BROMIDE 100 MG/10ML IV SOLN
INTRAVENOUS | Status: DC | PRN
  Administered 2012-04-22: 30 mg via INTRAVENOUS

## 2012-04-22 MED ORDER — HYDROMORPHONE HCL PF 1 MG/ML IJ SOLN
0.2500 mg | INTRAMUSCULAR | Status: DC | PRN
  Administered 2012-04-22 (×6): 0.5 mg via INTRAVENOUS
  Filled 2012-04-22 (×2): qty 1

## 2012-04-22 MED ORDER — IOHEXOL 300 MG/ML  SOLN
INTRAMUSCULAR | Status: DC | PRN
  Administered 2012-04-22: 50 mL

## 2012-04-22 MED ORDER — SUCCINYLCHOLINE CHLORIDE 20 MG/ML IJ SOLN
INTRAMUSCULAR | Status: DC | PRN
  Administered 2012-04-22: 100 mg via INTRAVENOUS

## 2012-04-22 MED ORDER — OXYCODONE-ACETAMINOPHEN 5-325 MG PO TABS
1.0000 | ORAL_TABLET | ORAL | Status: DC | PRN
  Administered 2012-04-22: 2 via ORAL
  Filled 2012-04-22: qty 2

## 2012-04-22 MED ORDER — BUPIVACAINE-EPINEPHRINE 0.25% -1:200000 IJ SOLN
INTRAMUSCULAR | Status: DC | PRN
  Administered 2012-04-22: 20 mL

## 2012-04-22 MED ORDER — PROMETHAZINE HCL 25 MG/ML IJ SOLN: 6.2500 mg | INTRAMUSCULAR | Status: DC | PRN

## 2012-04-22 MED ORDER — ONDANSETRON HCL 4 MG/2ML IJ SOLN
INTRAMUSCULAR | Status: DC | PRN
  Administered 2012-04-22: 4 mg via INTRAVENOUS

## 2012-04-22 MED ORDER — 0.9 % SODIUM CHLORIDE (POUR BTL) OPTIME
TOPICAL | Status: DC | PRN
  Administered 2012-04-22: 1000 mL

## 2012-04-22 MED ORDER — HYDROMORPHONE HCL PF 1 MG/ML IJ SOLN
INTRAMUSCULAR | Status: AC
  Filled 2012-04-22: qty 2

## 2012-04-22 SURGICAL SUPPLY — 39 items
APPLIER CLIP ROT 10 11.4 M/L (STAPLE)
BENZOIN TINCTURE PRP APPL 2/3 (GAUZE/BANDAGES/DRESSINGS) IMPLANT
CANISTER SUCTION 2500CC (MISCELLANEOUS) ×2 IMPLANT
CATH REDDICK CHOLANGI 4FR 50CM (CATHETERS) IMPLANT
CLIP APPLIE ROT 10 11.4 M/L (STAPLE) IMPLANT
CLOTH BEACON ORANGE TIMEOUT ST (SAFETY) ×2 IMPLANT
COVER MAYO STAND STRL (DRAPES) ×2 IMPLANT
DECANTER SPIKE VIAL GLASS SM (MISCELLANEOUS) ×2 IMPLANT
DERMABOND ADVANCED (GAUZE/BANDAGES/DRESSINGS) ×2
DERMABOND ADVANCED .7 DNX12 (GAUZE/BANDAGES/DRESSINGS) ×2 IMPLANT
DRAPE C-ARM 42X72 X-RAY (DRAPES) ×2 IMPLANT
DRAPE LAPAROSCOPIC ABDOMINAL (DRAPES) ×2 IMPLANT
ELECT REM PT RETURN 9FT ADLT (ELECTROSURGICAL) ×2
ELECTRODE REM PT RTRN 9FT ADLT (ELECTROSURGICAL) ×1 IMPLANT
GLOVE BIOGEL PI IND STRL 7.0 (GLOVE) ×1 IMPLANT
GLOVE BIOGEL PI INDICATOR 7.0 (GLOVE) ×1
GLOVE SS BIOGEL STRL SZ 7.5 (GLOVE) ×1 IMPLANT
GLOVE SUPERSENSE BIOGEL SZ 7.5 (GLOVE) ×1
GOWN STRL NON-REIN LRG LVL3 (GOWN DISPOSABLE) ×2 IMPLANT
GOWN STRL REIN XL XLG (GOWN DISPOSABLE) ×4 IMPLANT
HEMOSTAT SURGICEL 4X8 (HEMOSTASIS) IMPLANT
IV CATH 14GX2 1/4 (CATHETERS) IMPLANT
IV SET EXT 30 76VOL 4 MALE LL (IV SETS) IMPLANT
KIT BASIN OR (CUSTOM PROCEDURE TRAY) ×2 IMPLANT
NS IRRIG 1000ML POUR BTL (IV SOLUTION) ×2 IMPLANT
POUCH SPECIMEN RETRIEVAL 10MM (ENDOMECHANICALS) ×2 IMPLANT
SET CHOLANGIOGRAPH MIX (MISCELLANEOUS) ×2 IMPLANT
SET IRRIG TUBING LAPAROSCOPIC (IRRIGATION / IRRIGATOR) ×2 IMPLANT
SLEEVE Z-THREAD 5X100MM (TROCAR) ×2 IMPLANT
SOLUTION ANTI FOG 6CC (MISCELLANEOUS) ×2 IMPLANT
STOPCOCK K 69 2C6206 (IV SETS) IMPLANT
STRIP CLOSURE SKIN 1/2X4 (GAUZE/BANDAGES/DRESSINGS) IMPLANT
SUT MNCRL AB 4-0 PS2 18 (SUTURE) ×2 IMPLANT
TOWEL OR 17X26 10 PK STRL BLUE (TOWEL DISPOSABLE) ×2 IMPLANT
TRAY LAP CHOLE (CUSTOM PROCEDURE TRAY) ×2 IMPLANT
TROCAR HASSON GELL 12X100 (TROCAR) IMPLANT
TROCAR Z-THREAD FIOS 11X100 BL (TROCAR) IMPLANT
TROCAR Z-THREAD FIOS 5X100MM (TROCAR) ×6 IMPLANT
TUBING INSUFFLATION 10FT LAP (TUBING) ×2 IMPLANT

## 2012-04-22 NOTE — Preoperative (Signed)
Beta Blockers   Reason not to administer Beta Blockers:Not Applicable 

## 2012-04-22 NOTE — Discharge Summary (Signed)
   Patient ID: Kylie Baker [id number] 36 y.o. [birth year]  04/21/2012  Discharge date and time: 04/22/2012   Admitting Physician: Glenna Fellows T  Discharge Physician: Glenna Fellows T  Admission Diagnoses: Cholecystitis [575.10] ABD PAIN  Discharge Diagnoses: same  Operations: Procedure(s): LAPAROSCOPIC CHOLECYSTECTOMY WITH INTRAOPERATIVE CHOLANGIOGRAM  Admission Condition: fair  Discharged Condition: good  Indication for Admission: patient is a 36 year old female approximately 3 weeks postpartum. She presents with a persistent severe episode of crampy epigastric pain which is only partially relieved with medications in the emergency room. Ultrasound has revealed cholelithiasis with a stone impacted in the neck of the gallbladder. She is admitted for urgent cholecystectomy.  Hospital Course: the patient was admitted and treated initially with IV pain and nausea medication and antibiotics. She was taken to the operating room the following morning and underwent an uneventful laparoscopic cholecystectomy with cholangiogram. Her gallbladder was somewhat edematous but not severely inflamed. She was observed during the day and discharge is planned today if her pain is controlled and tolerated.   Disposition: Home  Patient Instructions:   [name], [name]  Home Medication Instructions MVH:[id number]   Printed on:04/22/12 1147  Medication Information                    labetalol (NORMODYNE) 200 MG tablet Take 200 mg by mouth 4 (four) times daily.           oxyCODONE-acetaminophen (ROXICET) 5-325 MG per tablet Take 1 tablet by mouth every 4 (four) hours as needed for pain.             Activity: activity as tolerated Diet: regular diet Wound Care: none needed  Follow-up:  With Dr. Johna Sheriff in 3 weeks.  Signed: Mariella Saa MD, FACS  04/22/2012, 11:47 AM

## 2012-04-22 NOTE — Op Note (Signed)
Preoperative diagnosis: Cholelithiasis and cholecystitis  Postoperative diagnosis: Cholelithiasis and cholecystitis  Surgical procedure: Laparoscopic cholecystectomy with intraoperative cholangiogram  Surgeon: Sharlet Salina T. Nirali Magouirk M.D.  Assistant: Darnell Level M.D.  Anesthesia: General Endotracheal  Complications: None  Estimated blood loss: Minimal  Description of procedure: The patient brought to the operating room, placed in the supine position on the operating table, and general endotracheal anesthesia induced. The abdomen was widely sterilely prepped and draped. The patient had received preoperative IV antibiotics and PAS were in place. Patient timeout was performed the correct procedure verified. Standard 4 port technique was used with an open Hassan cannula at the umbilicus and the remainder of the ports placed under direct vision. The gallbladder was visualized. It appeared mildly edematous. The fundus was grasped and elevated up over the liver and the infundibulum retracted inferiolaterally. Peritoneum anterior and posterior to close triangle was incised and fibrofatty tissue stripped off the neck of the gallbladder toward the porta hepatis. The distal gallbladder was thoroughly dissected. The cystic artery was identified in close triangle and the cystic duct gallbladder junction dissected 360.  A good critical view was obtained. When the anatomy was clear the cystic duct was clipped at the gallbladder junction and an operative cholangiogram obtained through the cystic duct. This showed good filling of a normal common bile duct and intrahepatic ducts with free flow into the duodenum and no filling defects. Following this the Cholangiocath was removed and the cystic duct was doubly clipped proximally and divided. The cystic artery was doubly clipped proximally and distally and divided. The gallbladder was dissected free from its bed using hook cautery and removed through the umbilical port  site. Complete hemostasis was obtained in the gallbladder bed. The right upper quadrant was thoroughly irrigated and hemostasis assured. Trochars were removed and all CO2 evacuated and the [facility] trocar site fascial defect closed. Skin incisions were closed with subcuticular Monocryl and Dermabond. Sponge needle and instrument counts were correct. The patient was taken to PACU in good condition.  Afton Mikelson T  04/22/2012

## 2012-04-22 NOTE — Anesthesia Preprocedure Evaluation (Signed)
Anesthesia Evaluation  Patient identified by MRN, date of birth, ID band Patient awake  General Assessment Comment:4 weeks post partum  Reviewed: Allergy & Precautions, H&P , NPO status , Patient's Chart, lab work & pertinent test results  Airway Mallampati: III TM Distance: <3 FB Neck ROM: Full    Dental No notable dental hx.    Pulmonary neg pulmonary ROS,  breath sounds clear to auscultation  Pulmonary exam normal       Cardiovascular hypertension, Pt. on medications Rhythm:Regular Rate:Normal     Neuro/Psych negative neurological ROS  negative psych ROS   GI/Hepatic Neg liver ROS, GERD-  Medicated,  Endo/Other  Morbid obesity  Renal/GU negative Renal ROS  negative genitourinary   Musculoskeletal negative musculoskeletal ROS (+)   Abdominal   Peds negative pediatric ROS (+)  Hematology negative hematology ROS (+)   Anesthesia Other Findings   Reproductive/Obstetrics negative OB ROS                           Anesthesia Physical Anesthesia Plan  ASA: III and Emergent  Anesthesia Plan:    Post-op Pain Management:    Induction: Intravenous and Rapid sequence  Airway Management Planned: Oral ETT  Additional Equipment:   Intra-op Plan:   Post-operative Plan: Extubation in OR  Informed Consent: I have reviewed the patients History and Physical, chart, labs and discussed the procedure including the risks, benefits and alternatives for the proposed anesthesia with the patient or authorized representative who has indicated his/her understanding and acceptance.   Dental advisory given  Plan Discussed with: CRNA  Anesthesia Plan Comments:         Anesthesia Quick Evaluation

## 2012-04-22 NOTE — Anesthesia Postprocedure Evaluation (Signed)
  Anesthesia Post-op Note  Patient: Kylie Baker  Procedure(s) Performed: Procedure(s) (LRB): LAPAROSCOPIC CHOLECYSTECTOMY WITH INTRAOPERATIVE CHOLANGIOGRAM (N/A)  Patient Location: PACU  Anesthesia Type: General  Level of Consciousness: awake and alert   Airway and Oxygen Therapy: Patient Spontanous Breathing  Post-op Pain: mild  Post-op Assessment: Post-op Vital signs reviewed, Patient's Cardiovascular Status Stable, Respiratory Function Stable, Patent Airway and No signs of Nausea or vomiting  Post-op Vital Signs: stable  Complications: No apparent anesthesia complications

## 2012-04-22 NOTE — Transfer of Care (Signed)
Immediate Anesthesia Transfer of Care Note  Patient: Kylie Baker  Procedure(s) Performed: Procedure(s) (LRB): LAPAROSCOPIC CHOLECYSTECTOMY WITH INTRAOPERATIVE CHOLANGIOGRAM (N/A)  Patient Location: PACU  Anesthesia Type: General  Level of Consciousness: awake, alert  and patient cooperative  Airway & Oxygen Therapy: Patient Spontanous Breathing and Patient connected to face mask oxygen  Post-op Assessment: Report given to PACU RN and Post -op Vital signs reviewed and stable  Post vital signs: Reviewed and stable  Complications: No apparent anesthesia complications

## 2012-04-23 ENCOUNTER — Encounter (HOSPITAL_COMMUNITY): Payer: Self-pay | Admitting: General Surgery

## 2012-05-10 ENCOUNTER — Ambulatory Visit (INDEPENDENT_AMBULATORY_CARE_PROVIDER_SITE_OTHER): Payer: Self-pay | Admitting: General Surgery

## 2012-05-10 ENCOUNTER — Encounter (INDEPENDENT_AMBULATORY_CARE_PROVIDER_SITE_OTHER): Payer: Self-pay | Admitting: General Surgery

## 2012-05-10 VITALS — BP 138/80 | HR 68 | Temp 97.6°F | Resp 16 | Ht 65.0 in | Wt 230.0 lb

## 2012-05-10 DIAGNOSIS — Z09 Encounter for follow-up examination after completed treatment for conditions other than malignant neoplasm: Secondary | ICD-10-CM

## 2012-05-10 NOTE — Progress Notes (Signed)
History: Patient returns 3 weeks following urgent laparoscopic cholecystectomy for early acute cholecystitis. She had still a little discomfort at her umbilicus that is continuing to improve. Her preoperative pain is relieved. No nausea or fever were other complaints.  Exam: She appears well. Abdomen is soft and nontender. Wounds all healing well.  Pathology revealed chronic cholecystitis and cholelithiasis.  Assessment and plan: Doing well following laparoscopic cholecystectomy without complication. No restrictions. She is to return as needed.

## 2012-08-08 ENCOUNTER — Emergency Department (HOSPITAL_COMMUNITY): Payer: Self-pay

## 2012-08-08 ENCOUNTER — Emergency Department (HOSPITAL_COMMUNITY)
Admission: EM | Admit: 2012-08-08 | Discharge: 2012-08-08 | Disposition: A | Discharge status: Home / Self Care | Payer: Self-pay | Attending: Emergency Medicine | Admitting: Emergency Medicine

## 2012-08-08 ENCOUNTER — Encounter (HOSPITAL_COMMUNITY): Payer: Self-pay | Admitting: *Deleted

## 2012-08-08 DIAGNOSIS — J45909 Unspecified asthma, uncomplicated: Secondary | ICD-10-CM | POA: Insufficient documentation

## 2012-08-08 DIAGNOSIS — Z9089 Acquired absence of other organs: Secondary | ICD-10-CM | POA: Insufficient documentation

## 2012-08-08 DIAGNOSIS — K429 Umbilical hernia without obstruction or gangrene: Secondary | ICD-10-CM | POA: Insufficient documentation

## 2012-08-08 DIAGNOSIS — R079 Chest pain, unspecified: Secondary | ICD-10-CM | POA: Insufficient documentation

## 2012-08-08 DIAGNOSIS — E669 Obesity, unspecified: Secondary | ICD-10-CM | POA: Insufficient documentation

## 2012-08-08 LAB — COMPREHENSIVE METABOLIC PANEL
ALT: 9 U/L (ref 0–35)
AST: 15 U/L (ref 0–37)
Albumin: 3.7 g/dL (ref 3.5–5.2)
Alkaline Phosphatase: 109 U/L (ref 39–117)
BUN: 8 mg/dL (ref 6–23)
CO2: 30 mEq/L (ref 19–32)
Calcium: 10.6 mg/dL — ABNORMAL HIGH (ref 8.4–10.5)
Chloride: 104 mEq/L (ref 96–112)
Creatinine, Ser: 0.44 mg/dL — ABNORMAL LOW (ref 0.50–1.10)
GFR calc Af Amer: >90 mL/min (ref >90)
GFR calc non Af Amer: >90 mL/min (ref >90)
Glucose, Bld: 92 mg/dL (ref 70–99)
Potassium: 3.7 mEq/L (ref 3.5–5.1)
Sodium: 139 mEq/L (ref 135–145)
Total Bilirubin: 0.3 mg/dL (ref 0.3–1.2)
Total Protein: 7.1 g/dL (ref 6.0–8.3)

## 2012-08-08 LAB — CBC WITH DIFFERENTIAL/PLATELET
Basophils Absolute: 0.1 10*3/uL (ref 0.0–0.1)
Basophils Relative: 1 % (ref 0–1)
Eosinophils Absolute: 0.1 10*3/uL (ref 0.0–0.7)
Eosinophils Relative: 2 % (ref 0–5)
HCT: 37.8 % (ref 36.0–46.0)
Hemoglobin: 11.3 g/dL — ABNORMAL LOW (ref 12.0–15.0)
Lymphocytes Relative: 46 % (ref 12–46)
Lymphs Abs: 2.6 10*3/uL (ref 0.7–4.0)
MCH: 20.7 pg — ABNORMAL LOW (ref 26.0–34.0)
MCHC: 29.9 g/dL — ABNORMAL LOW (ref 30.0–36.0)
MCV: 69.4 fL — ABNORMAL LOW (ref 78.0–100.0)
Monocytes Absolute: 0.4 10*3/uL (ref 0.1–1.0)
Monocytes Relative: 8 % (ref 3–12)
Neutro Abs: 2.4 10*3/uL (ref 1.7–7.7)
Neutrophils Relative %: 43 % (ref 43–77)
Platelets: 122 10*3/uL — ABNORMAL LOW (ref 150–400)
RBC: 5.45 MIL/uL — ABNORMAL HIGH (ref 3.87–5.11)
RDW: 16.2 % — ABNORMAL HIGH (ref 11.5–15.5)
WBC: 5.6 10*3/uL (ref 4.0–10.5)

## 2012-08-08 LAB — URINALYSIS, MICROSCOPIC ONLY
Bilirubin Urine: NEGATIVE
Glucose, UA: NEGATIVE mg/dL
Hgb urine dipstick: NEGATIVE
Ketones, ur: NEGATIVE mg/dL
Leukocytes, UA: NEGATIVE
Nitrite: NEGATIVE
Protein, ur: NEGATIVE mg/dL
Specific Gravity, Urine: 1.02 (ref 1.005–1.030)
Urobilinogen, UA: 0.2 mg/dL (ref 0.0–1.0)
pH: 6.5 (ref 5.0–8.0)

## 2012-08-08 LAB — POCT I-STAT TROPONIN I: Troponin i, poc: 0 ng/mL (ref 0.00–0.08)

## 2012-08-08 LAB — LIPASE, BLOOD: Lipase: 19 U/L (ref 11–59)

## 2012-08-08 LAB — POCT PREGNANCY, URINE: Preg Test, Ur: NEGATIVE

## 2012-08-08 MED ORDER — IOHEXOL 300 MG/ML  SOLN
100.0000 mL | Freq: Once | INTRAMUSCULAR | Status: AC | PRN
  Administered 2012-08-08: 100 mL via INTRAVENOUS

## 2012-08-08 NOTE — ED Notes (Addendum)
Pt reports gallbladder surgery in July. For last 2 weeks has had abdominal pain 9/10 and left sided intermittent chest pain 8/10 "squeezing" . Denies chest pain at present

## 2012-08-08 NOTE — ED Provider Notes (Signed)
History     CSN: [id number]  Arrival date & time 08/08/12  1331   First MD Initiated Contact with Patient 08/08/12 1422      Chief Complaint  Patient presents with  . Abdominal Pain    (Consider location/radiation/quality/duration/timing/severity/associated sxs/prior treatment) HPI Comments: Kylie Baker 36 y.o. female   The chief complaint is: Patient presents with:   Abdominal Pain    Female presents with chief complaint of abdominal pain. She is a history of appendectomy as a child and cholecystectomy in July as umbilical pain at site of port insertion for the past 2 weeks. She states that pain is intermittent, and has become increasingly worse. Pain is severe and at a 9/10. When it occurs. She denies any vomiting, but has had nausea. She denies any change in bowel habits, color, or consistency. She denies, diarrhea. Patien has also had chest pain since onset of her abdominal pain. Symptoms. She rates the pain at 9/10 describes it as left-sided, and squeezing. She's afraid it was her heart. She denies any shortness of breath. She does not smoke. She is nondiabetic. Risk factors include. Hypertension. NO family history of MI or stroke. Upper respiratory infection. She denies urinary or vaginal symptoms.   Patient is a 36 y.o. female presenting with abdominal pain and chest pain. The history is provided by the patient, a friend and medical records. No language interpreter was used.  Abdominal Pain The primary symptoms of the illness include abdominal pain and nausea. The primary symptoms of the illness do not include fever, fatigue, shortness of breath, vomiting, diarrhea, hematemesis, hematochezia, dysuria, vaginal discharge or vaginal bleeding. The current episode started yesterday. The onset of the illness was gradual. The problem has been resolved.  The abdominal pain began yesterday. The pain came on gradually. The abdominal pain has been resolved since its onset. The abdominal  pain is located in the periumbilical region. The abdominal pain does not radiate. The severity of the abdominal pain is 9/10 (now 2/10).  Symptoms associated with the illness do not include diaphoresis or hematuria. Significant associated medical issues include diabetes (gestational). Significant associated medical issues do not include PUD, GERD, inflammatory bowel disease, sickle cell disease, gallstones, liver disease, substance abuse, diverticulitis, HIV or cardiac disease.  Chest Pain The chest pain began 1 - 2 weeks ago. Chest pain occurs intermittently. The chest pain is unchanged. At its most intense, the pain is at 9/10. The pain is currently at 0/10. The severity of the pain is severe. The quality of the pain is described as squeezing and sharp. The pain does not radiate. Primary symptoms include abdominal pain and nausea. Pertinent negatives for primary symptoms include no fever, no fatigue, no syncope, no shortness of breath, no cough, no wheezing, no palpitations, no vomiting, no dizziness and no altered mental status.  Pertinent negatives for associated symptoms include no claudication, no diaphoresis, no lower extremity edema, no near-syncope, no numbness, no orthopnea, no paroxysmal nocturnal dyspnea and no weakness. She tried nothing for the symptoms. Risk factors include obesity.  Her past medical history is significant for diabetes (gestational).  Pertinent negatives for past medical history include no aneurysm, no anxiety/panic attacks, no aortic aneurysm, no aortic dissection, no arrhythmia, no bicuspid aortic valve, no CAD, no cancer, no congenital heart disease, no connective tissue disease, no COPD, no CHF, no DVT, no hyperhomocysteinemia, no hyperlipidemia, no hypertension, no Kawasaki disease, no Marfan's syndrome, no MI, no mitral valve prolapse, no pacemaker, no PE, no PVD, no  recent injury, no rheumatic fever, no seizures, no sickle cell disease, no sleep apnea, no stimulant use,  no strokes, no thyroid problem, no TIA, Turner syndrome and no valve disorder.  Pertinent negatives for family medical history include: no early MI in family and no stroke in family.     Past Medical History  Diagnosis Date  . Pregnancy induced hypertension   . Asthma   . Gestational diabetes 01/25/2012    Past Surgical History  Procedure Date  . Cesarean section 06/06/2010  . Appendectomy 1980's  . Cesarean section 03/27/2012    Procedure: CESAREAN SECTION;  Surgeon: Kathreen Cosier, MD;  Location: [facility];  Service: Gynecology;  Laterality: N/A;  Repeat Cesarean Section Delivery   Boy  @ 0005 , Apgars 8/9  . Cholecystectomy 04/22/2012    Procedure: LAPAROSCOPIC CHOLECYSTECTOMY WITH INTRAOPERATIVE CHOLANGIOGRAM;  Surgeon: Mariella Saa, MD;  Location: [facility];  Service: General;  Laterality: N/A;    Family History  Problem Relation Age of Onset  . Anesthesia problems Neg Hx   . Hypertension Mother   . Diabetes Father   . Asthma Father     History  Substance Use Topics  . Smoking status: Never Smoker   . Smokeless tobacco: Never Used  . Alcohol Use: No    OB History    Grav Para Term Preterm Abortions TAB SAB Ect Mult Living   3 2 1 1 1  0 1 0 0 2      Review of Systems  Constitutional: Negative for fever, diaphoresis and fatigue.  Respiratory: Negative for cough, shortness of breath and wheezing.   Cardiovascular: Positive for chest pain. Negative for palpitations, orthopnea, claudication, syncope and near-syncope.  Gastrointestinal: Positive for nausea and abdominal pain. Negative for vomiting, diarrhea, hematochezia and hematemesis.  Genitourinary: Negative for dysuria, hematuria, flank pain, vaginal bleeding and vaginal discharge.  Neurological: Negative for dizziness, seizures, weakness and numbness.  Psychiatric/Behavioral: Negative for altered mental status.  All other systems reviewed and are negative.    Allergies  Aspirin  Home Medications    Current Outpatient Rx  Name Route Sig Dispense Refill  . AMLODIPINE BESYLATE 10 MG PO TABS Oral Take 10 mg by mouth daily.      BP 137/83  Pulse 73  Temp 98.8 F (37.1 C) (Oral)  Resp 18  SpO2 100%  Physical Exam  Constitutional: She is oriented to person, place, and time. She appears well-developed and well-nourished. No distress.  HENT:  Head: Normocephalic and atraumatic.  Eyes: Conjunctivae normal are normal. No scleral icterus.  Neck: Normal range of motion.  Cardiovascular: Normal rate, regular rhythm and normal heart sounds.  Exam reveals no gallop and no friction rub.   No murmur heard. Pulmonary/Chest: Effort normal and breath sounds normal. No respiratory distress.  Abdominal: Soft. Bowel sounds are normal. She exhibits no distension and no mass. There is tenderness. There is no rebound and no guarding.       Obese abdomen well-healed port site. Inferior to umbilicus. No signs of infection.  TTP in RUQ.   Neurological: She is alert and oriented to person, place, and time.  Skin: Skin is warm and dry. She is not diaphoretic.    ED Course  Procedures (including critical care time)  Results for orders placed during the hospital encounter of 08/08/12  CBC WITH DIFFERENTIAL      Component Value Range   WBC 5.6  4.0 - 10.5 K/uL   RBC 5.45 (*) 3.87 - 5.11  MIL/uL   Hemoglobin 11.3 (*) 12.0 - 15.0 g/dL   HCT 16.1  09.6 - 04.5 %   MCV 69.4 (*) 78.0 - 100.0 fL   MCH 20.7 (*) 26.0 - 34.0 pg   MCHC 29.9 (*) 30.0 - 36.0 g/dL   RDW 40.9 (*) 81.1 - 91.4 %   Platelets 122 (*) 150 - 400 K/uL   Neutrophils Relative 43  43 - 77 %   Lymphocytes Relative 46  12 - 46 %   Monocytes Relative 8  3 - 12 %   Eosinophils Relative 2  0 - 5 %   Basophils Relative 1  0 - 1 %   Neutro Abs 2.4  1.7 - 7.7 K/uL   Lymphs Abs 2.6  0.7 - 4.0 K/uL   Monocytes Absolute 0.4  0.1 - 1.0 K/uL   Eosinophils Absolute 0.1  0.0 - 0.7 K/uL   Basophils Absolute 0.1  0.0 - 0.1 K/uL   RBC Morphology  ELLIPTOCYTES     WBC Morphology WHITE COUNT CONFIRMED ON SMEAR     Smear Review PLATELET COUNT CONFIRMED BY SMEAR    COMPREHENSIVE METABOLIC PANEL      Component Value Range   Sodium 139  135 - 145 mEq/L   Potassium 3.7  3.5 - 5.1 mEq/L   Chloride 104  96 - 112 mEq/L   CO2 30  19 - 32 mEq/L   Glucose, Bld 92  70 - 99 mg/dL   BUN 8  6 - 23 mg/dL   Creatinine, Ser 7.82 (*) 0.50 - 1.10 mg/dL   Calcium 95.6 (*) 8.4 - 10.5 mg/dL   Total Protein 7.1  6.0 - 8.3 g/dL   Albumin 3.7  3.5 - 5.2 g/dL   AST 15  0 - 37 U/L   ALT 9  0 - 35 U/L   Alkaline Phosphatase 109  39 - 117 U/L   Total Bilirubin 0.3  0.3 - 1.2 mg/dL   GFR calc non Af Amer >90  >90 mL/min   GFR calc Af Amer >90  >90 mL/min  URINALYSIS, MICROSCOPIC ONLY      Component Value Range   Color, Urine YELLOW  YELLOW   APPearance CLEAR  CLEAR   Specific Gravity, Urine 1.020  1.005 - 1.030   pH 6.5  5.0 - 8.0   Glucose, UA NEGATIVE  NEGATIVE mg/dL   Hgb urine dipstick NEGATIVE  NEGATIVE   Bilirubin Urine NEGATIVE  NEGATIVE   Ketones, ur NEGATIVE  NEGATIVE mg/dL   Protein, ur NEGATIVE  NEGATIVE mg/dL   Urobilinogen, UA 0.2  0.0 - 1.0 mg/dL   Nitrite NEGATIVE  NEGATIVE   Leukocytes, UA NEGATIVE  NEGATIVE   RBC / HPF 0-2  <3 RBC/hpf   Squamous Epithelial / LPF RARE  RARE   Urine-Other MUCOUS PRESENT    POCT PREGNANCY, URINE      Component Value Range   Preg Test, Ur NEGATIVE  NEGATIVE  POCT I-STAT TROPONIN I      Component Value Range   Troponin i, poc 0.00  0.00 - 0.08 ng/mL   Comment 3             Date: 08/08/2012  Rate: 81  Rhythm: normal sinus rhythm  QRS Axis: normal  Intervals: normal  ST/T Wave abnormalities: normal  Conduction Disutrbances: none  Narrative Interpretation: normal ECG  Old EKG Reviewed: No significant changes noted     1. Umbilical hernia  MDM  Negative cardiac exam. Positive ct for umbilical hernia.  This is palpable on exam and reducible.  Patient without pain or nausea  currently. She will follow up with her Surgeon. Discussed reasons to seek immediate care. Patient expresses understanding and agrees with plan.         Arthor Captain, PA-C 08/08/12 2050

## 2012-08-08 NOTE — ED Notes (Signed)
Discharge instructions reviewed. All questions answered.

## 2012-08-08 NOTE — ED Notes (Signed)
RN has labels - will notify me if i need to draw labs.

## 2012-08-08 NOTE — ED Notes (Signed)
Pt has finished CT Contrast.

## 2012-08-10 NOTE — ED Provider Notes (Signed)
Medical screening examination/treatment/procedure(s) were performed by non-physician practitioner and as supervising physician I was immediately available for consultation/collaboration.  Raeford Razor, MD 08/10/12 [phone]

## 2012-09-07 ENCOUNTER — Emergency Department (HOSPITAL_COMMUNITY): Payer: Self-pay

## 2012-09-07 ENCOUNTER — Encounter (HOSPITAL_COMMUNITY): Payer: Self-pay | Admitting: Anesthesiology

## 2012-09-07 ENCOUNTER — Encounter (HOSPITAL_COMMUNITY)
Admission: EM | Disposition: A | Discharge status: Home / Self Care | Payer: Self-pay | Source: Home / Self Care | Attending: Emergency Medicine

## 2012-09-07 ENCOUNTER — Encounter (HOSPITAL_COMMUNITY): Payer: Self-pay | Admitting: *Deleted

## 2012-09-07 ENCOUNTER — Observation Stay (HOSPITAL_COMMUNITY)
Admission: EM | Admit: 2012-09-07 | Discharge: 2012-09-08 | Disposition: A | Discharge status: Home / Self Care | Payer: Self-pay | Attending: General Surgery | Admitting: General Surgery

## 2012-09-07 ENCOUNTER — Emergency Department (HOSPITAL_COMMUNITY)
Admission: EM | Admit: 2012-09-07 | Discharge: 2012-09-07 | Disposition: A | Discharge status: Home / Self Care | Payer: Self-pay | Attending: General Surgery | Admitting: General Surgery

## 2012-09-07 ENCOUNTER — Observation Stay (HOSPITAL_COMMUNITY): Payer: Self-pay | Admitting: Anesthesiology

## 2012-09-07 ENCOUNTER — Encounter (HOSPITAL_COMMUNITY): Payer: Self-pay | Admitting: Emergency Medicine

## 2012-09-07 DIAGNOSIS — K219 Gastro-esophageal reflux disease without esophagitis: Secondary | ICD-10-CM | POA: Insufficient documentation

## 2012-09-07 DIAGNOSIS — I10 Essential (primary) hypertension: Secondary | ICD-10-CM | POA: Insufficient documentation

## 2012-09-07 DIAGNOSIS — K429 Umbilical hernia without obstruction or gangrene: Secondary | ICD-10-CM

## 2012-09-07 DIAGNOSIS — J45909 Unspecified asthma, uncomplicated: Secondary | ICD-10-CM | POA: Insufficient documentation

## 2012-09-07 DIAGNOSIS — K430 Incisional hernia with obstruction, without gangrene: Principal | ICD-10-CM | POA: Insufficient documentation

## 2012-09-07 DIAGNOSIS — Z23 Encounter for immunization: Secondary | ICD-10-CM | POA: Insufficient documentation

## 2012-09-07 DIAGNOSIS — E119 Type 2 diabetes mellitus without complications: Secondary | ICD-10-CM | POA: Insufficient documentation

## 2012-09-07 DIAGNOSIS — Z9889 Other specified postprocedural states: Secondary | ICD-10-CM | POA: Insufficient documentation

## 2012-09-07 DIAGNOSIS — Z79899 Other long term (current) drug therapy: Secondary | ICD-10-CM | POA: Insufficient documentation

## 2012-09-07 HISTORY — PX: UMBILICAL HERNIA REPAIR: SHX196

## 2012-09-07 LAB — CBC WITH DIFFERENTIAL/PLATELET
Basophils Relative: 0 % (ref 0–1)
Eosinophils Absolute: 0.1 10*3/uL (ref 0.0–0.7)
Eosinophils Relative: 2 % (ref 0–5)
HCT: 38.6 % (ref 36.0–46.0)
Hemoglobin: 11.6 g/dL — ABNORMAL LOW (ref 12.0–15.0)
Lymphs Abs: 3.2 10*3/uL (ref 0.7–4.0)
MCH: 20.9 pg — ABNORMAL LOW (ref 26.0–34.0)
MCHC: 30.1 g/dL (ref 30.0–36.0)
MCV: 69.5 fL — ABNORMAL LOW (ref 78.0–100.0)
Monocytes Absolute: 0.5 10*3/uL (ref 0.1–1.0)
Monocytes Relative: 7 % (ref 3–12)
Neutrophils Relative %: 47 % (ref 43–77)
RBC: 5.55 MIL/uL — ABNORMAL HIGH (ref 3.87–5.11)

## 2012-09-07 LAB — COMPREHENSIVE METABOLIC PANEL
Alkaline Phosphatase: 116 U/L (ref 39–117)
BUN: 8 mg/dL (ref 6–23)
Creatinine, Ser: 0.41 mg/dL — ABNORMAL LOW (ref 0.50–1.10)
GFR calc Af Amer: >90 mL/min (ref >90)
Glucose, Bld: 123 mg/dL — ABNORMAL HIGH (ref 70–99)
Potassium: 3.5 mEq/L (ref 3.5–5.1)
Total Protein: 7.3 g/dL (ref 6.0–8.3)

## 2012-09-07 LAB — URINALYSIS, MICROSCOPIC ONLY
Bilirubin Urine: NEGATIVE
Ketones, ur: NEGATIVE mg/dL
Leukocytes, UA: NEGATIVE
Nitrite: NEGATIVE
Specific Gravity, Urine: 1.012 (ref 1.005–1.030)
Urobilinogen, UA: 0.2 mg/dL (ref 0.0–1.0)

## 2012-09-07 LAB — CBC
HCT: 37.5 % (ref 36.0–46.0)
Hemoglobin: 11.3 g/dL — ABNORMAL LOW (ref 12.0–15.0)
MCH: 20.7 pg — ABNORMAL LOW (ref 26.0–34.0)
MCV: 68.8 fL — ABNORMAL LOW (ref 78.0–100.0)
RBC: 5.45 MIL/uL — ABNORMAL HIGH (ref 3.87–5.11)

## 2012-09-07 LAB — LIPASE, BLOOD: Lipase: 26 U/L (ref 11–59)

## 2012-09-07 LAB — CREATININE, SERUM: Creatinine, Ser: 0.47 mg/dL — ABNORMAL LOW (ref 0.50–1.10)

## 2012-09-07 SURGERY — REPAIR, HERNIA, UMBILICAL, ADULT
Anesthesia: General | Site: Abdomen | Wound class: Clean

## 2012-09-07 MED ORDER — CEFAZOLIN SODIUM-DEXTROSE 2-3 GM-% IV SOLR
INTRAVENOUS | Status: AC
  Filled 2012-09-07: qty 50

## 2012-09-07 MED ORDER — LACTATED RINGERS IV SOLN: INTRAVENOUS | Status: DC

## 2012-09-07 MED ORDER — BUPIVACAINE HCL (PF) 0.25 % IJ SOLN
INTRAMUSCULAR | Status: AC
  Filled 2012-09-07: qty 30

## 2012-09-07 MED ORDER — ONDANSETRON HCL 4 MG/2ML IJ SOLN: 4.0000 mg | Freq: Four times a day (QID) | INTRAMUSCULAR | Status: DC | PRN

## 2012-09-07 MED ORDER — LIDOCAINE HCL 4 % MT SOLN
OROMUCOSAL | Status: DC | PRN
  Administered 2012-09-07: 4 mL via TOPICAL

## 2012-09-07 MED ORDER — CEFAZOLIN SODIUM 1-5 GM-% IV SOLN
1.0000 g | Freq: Four times a day (QID) | INTRAVENOUS | Status: DC
  Administered 2012-09-08: 1 g via INTRAVENOUS
  Filled 2012-09-07 (×3): qty 50

## 2012-09-07 MED ORDER — HYDROMORPHONE HCL PF 1 MG/ML IJ SOLN
1.0000 mg | Freq: Once | INTRAMUSCULAR | Status: AC
  Administered 2012-09-07: 1 mg via INTRAVENOUS
  Filled 2012-09-07: qty 1

## 2012-09-07 MED ORDER — PANTOPRAZOLE SODIUM 40 MG IV SOLR
40.0000 mg | Freq: Every day | INTRAVENOUS | Status: DC
  Filled 2012-09-07: qty 40

## 2012-09-07 MED ORDER — LACTATED RINGERS IV SOLN
INTRAVENOUS | Status: DC | PRN
  Administered 2012-09-07 (×2): via INTRAVENOUS

## 2012-09-07 MED ORDER — KCL IN DEXTROSE-NACL 20-5-0.9 MEQ/L-%-% IV SOLN
INTRAVENOUS | Status: DC
  Filled 2012-09-07 (×4): qty 1000

## 2012-09-07 MED ORDER — ACETAMINOPHEN 650 MG RE SUPP: 650.0000 mg | Freq: Four times a day (QID) | RECTAL | Status: DC | PRN

## 2012-09-07 MED ORDER — ENOXAPARIN SODIUM 40 MG/0.4ML ~~LOC~~ SOLN
40.0000 mg | SUBCUTANEOUS | Status: DC
  Filled 2012-09-07: qty 0.4

## 2012-09-07 MED ORDER — ONDANSETRON HCL 4 MG/2ML IJ SOLN
4.0000 mg | Freq: Four times a day (QID) | INTRAMUSCULAR | Status: DC | PRN
Start: 2012-09-07 — End: 2012-09-07

## 2012-09-07 MED ORDER — HEPARIN SODIUM (PORCINE) 5000 UNIT/ML IJ SOLN
5000.0000 [IU] | Freq: Three times a day (TID) | INTRAMUSCULAR | Status: DC
  Filled 2012-09-07 (×4): qty 1

## 2012-09-07 MED ORDER — GLYCOPYRROLATE 0.2 MG/ML IJ SOLN
INTRAMUSCULAR | Status: DC | PRN
  Administered 2012-09-07: 0.4 mg via INTRAVENOUS

## 2012-09-07 MED ORDER — PROMETHAZINE HCL 25 MG/ML IJ SOLN: 6.2500 mg | INTRAMUSCULAR | Status: DC | PRN

## 2012-09-07 MED ORDER — BUPIVACAINE HCL (PF) 0.5 % IJ SOLN
INTRAMUSCULAR | Status: AC
  Filled 2012-09-07: qty 30

## 2012-09-07 MED ORDER — HYDROMORPHONE HCL PF 1 MG/ML IJ SOLN: 0.2500 mg | INTRAMUSCULAR | Status: DC | PRN

## 2012-09-07 MED ORDER — ONDANSETRON HCL 4 MG/2ML IJ SOLN
INTRAMUSCULAR | Status: DC | PRN
  Administered 2012-09-07 (×2): 4 mg via INTRAVENOUS

## 2012-09-07 MED ORDER — MIDAZOLAM HCL 5 MG/5ML IJ SOLN
INTRAMUSCULAR | Status: DC | PRN
  Administered 2012-09-07: 2 mg via INTRAVENOUS

## 2012-09-07 MED ORDER — AMLODIPINE BESYLATE 10 MG PO TABS
10.0000 mg | ORAL_TABLET | Freq: Every day | ORAL | Status: DC
  Administered 2012-09-08: 10 mg via ORAL
  Filled 2012-09-07 (×2): qty 1

## 2012-09-07 MED ORDER — DIPHENHYDRAMINE HCL 12.5 MG/5ML PO ELIX: 12.5000 mg | ORAL_SOLUTION | Freq: Four times a day (QID) | ORAL | Status: DC | PRN

## 2012-09-07 MED ORDER — ROCURONIUM BROMIDE 100 MG/10ML IV SOLN
INTRAVENOUS | Status: DC | PRN
  Administered 2012-09-07: 5 mg via INTRAVENOUS

## 2012-09-07 MED ORDER — SUCCINYLCHOLINE CHLORIDE 20 MG/ML IJ SOLN
INTRAMUSCULAR | Status: DC | PRN
  Administered 2012-09-07: 100 mg via INTRAVENOUS

## 2012-09-07 MED ORDER — DOCUSATE SODIUM 100 MG PO CAPS
100.0000 mg | ORAL_CAPSULE | Freq: Two times a day (BID) | ORAL | Status: DC
  Filled 2012-09-07: qty 1

## 2012-09-07 MED ORDER — OXYCODONE-ACETAMINOPHEN 5-325 MG PO TABS: 2.0000 | ORAL_TABLET | ORAL | Status: DC | PRN

## 2012-09-07 MED ORDER — 0.9 % SODIUM CHLORIDE (POUR BTL) OPTIME
TOPICAL | Status: DC | PRN
  Administered 2012-09-07: 1000 mL

## 2012-09-07 MED ORDER — DIPHENHYDRAMINE HCL 50 MG/ML IJ SOLN: 12.5000 mg | Freq: Four times a day (QID) | INTRAMUSCULAR | Status: DC | PRN

## 2012-09-07 MED ORDER — HYDROMORPHONE HCL PF 1 MG/ML IJ SOLN
0.2500 mg | INTRAMUSCULAR | Status: DC | PRN
  Administered 2012-09-07 (×2): 0.5 mg via INTRAVENOUS

## 2012-09-07 MED ORDER — FENTANYL CITRATE 0.05 MG/ML IJ SOLN
INTRAMUSCULAR | Status: DC | PRN
  Administered 2012-09-07: 100 ug via INTRAVENOUS
  Administered 2012-09-07 (×3): 50 ug via INTRAVENOUS
  Administered 2012-09-07 (×3): 100 ug via INTRAVENOUS

## 2012-09-07 MED ORDER — LIDOCAINE HCL (CARDIAC) 20 MG/ML IV SOLN
INTRAVENOUS | Status: DC | PRN
  Administered 2012-09-07: 50 mg via INTRAVENOUS

## 2012-09-07 MED ORDER — HYDROMORPHONE HCL PF 1 MG/ML IJ SOLN
INTRAMUSCULAR | Status: AC
  Filled 2012-09-07: qty 1

## 2012-09-07 MED ORDER — ACETAMINOPHEN 10 MG/ML IV SOLN
INTRAVENOUS | Status: DC | PRN
  Administered 2012-09-07: 1000 mg via INTRAVENOUS

## 2012-09-07 MED ORDER — DEXTROSE 5 % IV SOLN
3.0000 g | INTRAVENOUS | Status: AC
  Administered 2012-09-07: 2 g via INTRAVENOUS
  Filled 2012-09-07: qty 3000

## 2012-09-07 MED ORDER — ACETAMINOPHEN 325 MG PO TABS: 650.0000 mg | ORAL_TABLET | Freq: Four times a day (QID) | ORAL | Status: DC | PRN

## 2012-09-07 MED ORDER — HYDROMORPHONE HCL PF 1 MG/ML IJ SOLN
1.0000 mg | INTRAMUSCULAR | Status: DC | PRN
Start: 2012-09-07 — End: 2012-09-07

## 2012-09-07 MED ORDER — KCL IN DEXTROSE-NACL 20-5-0.9 MEQ/L-%-% IV SOLN
INTRAVENOUS | Status: AC
  Filled 2012-09-07: qty 1000

## 2012-09-07 MED ORDER — OXYCODONE HCL 5 MG PO TABS: 5.0000 mg | ORAL_TABLET | ORAL | Status: DC | PRN

## 2012-09-07 MED ORDER — OXYCODONE-ACETAMINOPHEN 5-325 MG PO TABS
1.0000 | ORAL_TABLET | ORAL | Status: DC | PRN
  Administered 2012-09-08 (×2): 2 via ORAL
  Filled 2012-09-07 (×2): qty 2

## 2012-09-07 MED ORDER — ACETAMINOPHEN 10 MG/ML IV SOLN
INTRAVENOUS | Status: AC
  Filled 2012-09-07: qty 100

## 2012-09-07 MED ORDER — PROPOFOL 10 MG/ML IV BOLUS
INTRAVENOUS | Status: DC | PRN
  Administered 2012-09-07: 50 mg via INTRAVENOUS
  Administered 2012-09-07: 200 mg via INTRAVENOUS

## 2012-09-07 MED ORDER — FENTANYL CITRATE 0.05 MG/ML IJ SOLN
INTRAMUSCULAR | Status: AC
  Filled 2012-09-07: qty 2

## 2012-09-07 MED ORDER — INFLUENZA VIRUS VACC SPLIT PF IM SUSP: 0.5000 mL | INTRAMUSCULAR | Status: DC

## 2012-09-07 MED ORDER — KCL IN DEXTROSE-NACL 20-5-0.9 MEQ/L-%-% IV SOLN
INTRAVENOUS | Status: DC
  Administered 2012-09-07: 23:00:00 via INTRAVENOUS
  Filled 2012-09-07 (×3): qty 1000

## 2012-09-07 MED ORDER — NEOSTIGMINE METHYLSULFATE 1 MG/ML IJ SOLN
INTRAMUSCULAR | Status: DC | PRN
  Administered 2012-09-07: 3 mg via INTRAVENOUS

## 2012-09-07 MED ORDER — LACTATED RINGERS IV SOLN
INTRAVENOUS | Status: DC
  Administered 2012-09-07: 1000 mL via INTRAVENOUS

## 2012-09-07 MED ORDER — FENTANYL CITRATE 0.05 MG/ML IJ SOLN
50.0000 ug | INTRAMUSCULAR | Status: AC | PRN
  Administered 2012-09-07: 100 ug via INTRAVENOUS

## 2012-09-07 MED ORDER — DEXAMETHASONE SODIUM PHOSPHATE 10 MG/ML IJ SOLN
INTRAMUSCULAR | Status: DC | PRN
  Administered 2012-09-07: 10 mg via INTRAVENOUS

## 2012-09-07 MED ORDER — HYDROMORPHONE HCL PF 1 MG/ML IJ SOLN
INTRAMUSCULAR | Status: DC | PRN
  Administered 2012-09-07 (×2): 1 mg via INTRAVENOUS

## 2012-09-07 MED ORDER — MORPHINE SULFATE 2 MG/ML IJ SOLN
2.0000 mg | INTRAMUSCULAR | Status: DC | PRN
  Administered 2012-09-08: 6 mg via INTRAVENOUS
  Administered 2012-09-08: 4 mg via INTRAVENOUS
  Filled 2012-09-07: qty 3
  Filled 2012-09-07: qty 2

## 2012-09-07 MED ORDER — LABETALOL HCL 5 MG/ML IV SOLN
INTRAVENOUS | Status: DC | PRN
  Administered 2012-09-07: 5 mg via INTRAVENOUS
  Administered 2012-09-07: 10 mg via INTRAVENOUS
  Administered 2012-09-07: 5 mg via INTRAVENOUS

## 2012-09-07 MED ORDER — ONDANSETRON HCL 4 MG PO TABS: 4.0000 mg | ORAL_TABLET | Freq: Four times a day (QID) | ORAL | Status: DC | PRN

## 2012-09-07 MED ORDER — BUPIVACAINE HCL 0.5 % IJ SOLN
INTRAMUSCULAR | Status: DC | PRN
  Administered 2012-09-07: 10 mL

## 2012-09-07 SURGICAL SUPPLY — 43 items
BENZOIN TINCTURE PRP APPL 2/3 (GAUZE/BANDAGES/DRESSINGS) ×2 IMPLANT
BLADE HEX COATED 2.75 (ELECTRODE) ×2 IMPLANT
BLADE SURG 15 STRL LF DISP TIS (BLADE) ×1 IMPLANT
BLADE SURG 15 STRL SS (BLADE) ×1
CLOSURE STERI-STRIP 1/4X4 (GAUZE/BANDAGES/DRESSINGS) ×2 IMPLANT
CLOTH BEACON ORANGE TIMEOUT ST (SAFETY) ×2 IMPLANT
DECANTER SPIKE VIAL GLASS SM (MISCELLANEOUS) IMPLANT
DRAPE LAPAROTOMY T 102X78X121 (DRAPES) ×2 IMPLANT
DRAPE UTILITY XL STRL (DRAPES) ×2 IMPLANT
DRSG TEGADERM 4X4.75 (GAUZE/BANDAGES/DRESSINGS) IMPLANT
ELECT REM PT RETURN 9FT ADLT (ELECTROSURGICAL) ×2
ELECTRODE REM PT RTRN 9FT ADLT (ELECTROSURGICAL) ×1 IMPLANT
GLOVE BIO SURGEON STRL SZ7 (GLOVE) ×2 IMPLANT
GLOVE BIOGEL PI IND STRL 7.0 (GLOVE) IMPLANT
GLOVE BIOGEL PI IND STRL 7.5 (GLOVE) ×1 IMPLANT
GLOVE BIOGEL PI INDICATOR 7.0 (GLOVE)
GLOVE BIOGEL PI INDICATOR 7.5 (GLOVE) ×1
GOWN STRL NON-REIN LRG LVL3 (GOWN DISPOSABLE) ×2 IMPLANT
GOWN STRL REIN XL XLG (GOWN DISPOSABLE) ×2 IMPLANT
KIT BASIN OR (CUSTOM PROCEDURE TRAY) ×2 IMPLANT
MESH HERNIA 3X6 (Mesh General) ×2 IMPLANT
NEEDLE HYPO 22GX1.5 SAFETY (NEEDLE) ×2 IMPLANT
NEEDLE HYPO 25X1 1.5 SAFETY (NEEDLE) IMPLANT
NS IRRIG 1000ML POUR BTL (IV SOLUTION) ×2 IMPLANT
PACK BASIC VI WITH GOWN DISP (CUSTOM PROCEDURE TRAY) ×2 IMPLANT
PENCIL BUTTON HOLSTER BLD 10FT (ELECTRODE) ×2 IMPLANT
SPONGE GAUZE 4X4 12PLY (GAUZE/BANDAGES/DRESSINGS) ×2 IMPLANT
SPONGE LAP 4X18 X RAY DECT (DISPOSABLE) ×4 IMPLANT
STAPLER VISISTAT 35W (STAPLE) ×2 IMPLANT
STRIP CLOSURE SKIN 1/2X4 (GAUZE/BANDAGES/DRESSINGS) IMPLANT
STRIP CLOSURE SKIN 1/4X4 (GAUZE/BANDAGES/DRESSINGS) ×2 IMPLANT
SUT MNCRL AB 4-0 PS2 18 (SUTURE) ×2 IMPLANT
SUT NOVA NAB DX-16 0-1 5-0 T12 (SUTURE) ×4 IMPLANT
SUT NOVA NAB GS-21 0 18 T12 DT (SUTURE) IMPLANT
SUT PROLENE 0 CT 1 CR/8 (SUTURE) IMPLANT
SUT PROLENE 0 CT 2 (SUTURE) ×2 IMPLANT
SUT VIC AB 3-0 SH 27 (SUTURE) ×1
SUT VIC AB 3-0 SH 27X BRD (SUTURE) ×1 IMPLANT
SUT VIC AB 3-0 SH 27XBRD (SUTURE) IMPLANT
SYR BULB IRRIGATION 50ML (SYRINGE) ×2 IMPLANT
SYR CONTROL 10ML LL (SYRINGE) ×2 IMPLANT
TAPE CLOTH SURG 4X10 WHT LF (GAUZE/BANDAGES/DRESSINGS) ×2 IMPLANT
TOWEL OR 17X26 10 PK STRL BLUE (TOWEL DISPOSABLE) ×4 IMPLANT

## 2012-09-07 NOTE — Op Note (Signed)
Operative Note  Kylie Baker female 36 y.o. 09/07/2012  PREOPERATIVE DX:  Incarcerated umbilical incisional hernia  POSTOPERATIVE DX:  Same  PROCEDURE:  Emergency repair of incarcerated umbilical incisional hernia with mesh         Surgeon: Adolph Pollack   Assistants: None  Anesthesia: General endotracheal anesthesia  Indications:   This is a 36 year old female with a known umbilical incisional hernia containing omentum.  It became increasingly painful and was associated with nausea and vomiting.  She is very tender over the hernia.  She is brought to the operating room for emergency repair.    Procedure Detail:  She was seen in the holding area. She is brought to the operating room placed supine on the operating table general anesthetic was administered. The abdominal wall was sterilely prepped and draped. A timeout was performed. A previous subumbilical scar was reincised and  I extended the incision transversely in both directions.  Directly under the skin was a large hernia sac. I dissected this free from the skin. There was omentum out of the hernia sac. I entered the hernia sac. The omentum was edematous but it was viable. I divided adhesions between the omentum and hernia sac and reduced it back into the abdominal cavity. The hernia appeared to be about 5 cm in size. I dissected the sac free for subcutaneous tissues and I removed part of the sac.    Following this, I dissected subcutaneous tissue free from the fascia. I also mobilized the umbilicus off the fascia. I did this for a distance of about 4 cm around the primary defect. I was able to approximate the fascia to close the hernia in a transverse fashion without tension. I subsequently primary close the hernia defect with interrupted #1 Novafil sutures in the lung.   Because there was no contamination, I decided to go ahead and use mesh to reinforce the repair.  A piece of polypropylene mesh was brought into the field.  A primary repair sutures were threaded up to the mesh and tied anchoring the mesh directly over the primary repair. The periphery of the mesh was then anchored to the surrounding fascia with a running 0 Prolene suture to allow for about 3 cm of overlap. Excess mesh was trimmed and removed.  The wound was inspected and hemostasis was adequate. Marcaine solution was infiltrated into the fascia and subcutaneous tissue. The umbilicus was then implanted on the mesh with 3-0 Vicryl suture. The subcutaneous tissue was irrigated. The subcutaneous tissue was then closed over the mesh with running 0 Vicryl suture. The skin was closed with a combination of staples and Steri-Strips. A bulky dressing was applied.  She tolerated the procedure well without any apparent complications and was taken to the recovery room in satisfactory condition.    Findings: There was incarcerated omentum in the hernia but it was viable.  Estimated Blood Loss:  less than 100 mL         Drains: none  Blood Given: none          Specimens: Hernia sac        Complications:  * No complications entered in OR log *         Disposition: PACU - hemodynamically stable.         Condition: stable

## 2012-09-07 NOTE — H&P (Addendum)
Kylie Baker is an 36 y.o. female.   Chief Complaint: Abdominal pain, incarcerated umbilical hernia. HPI: Patient comes in today with worsening abdominal pain. She was seen last night in the emergency department and was diagnosed with a reducible periumbilical hernia. She's had ongoing pain as well as nausea vomiting and subjective fever. Patient has had a recent laparoscopic cholecystectomy in July of this year by Dr. Johna Sheriff for cholelithiasis and cholecystitis. She did well post operatively and was discharged in stable condition.  She has had swelling and pain at her umbilicus over the last few weeks, but it worsened yesterday.  She was seen in the ED last night and discharged, but continues to have significant pain at her umbilicus.  We are asked to admit the patient today for surgical intervention of her incarcerated umbilical hernia. She will admitted to our service, made NPO, given  IVF and and pain management, as well as pre-op ABX. The plan is for the patient to have an open repair of her incarcerated umbilical hernia this evening. The planned procedure has been discussed with the patient and her husband and will be re-visited by Dr. Corliss Skains and Abbey Chatters (on-call tonight) with the patient as well. She has been advised of the general possible complications of the surgery which include but are not limited to infection, bleeding or damage to other structures. She has verbalized understanding of these risks and has elected to go forth with the planned surgery.    Past Medical History  Diagnosis Date  . Pregnancy induced hypertension   . Asthma   . Gestational diabetes 01/25/2012    Past Surgical History  Procedure Date  . Cesarean section 06/06/2010  . Appendectomy 1980's  . Cesarean section 03/27/2012    Procedure: CESAREAN SECTION;  Surgeon: Kathreen Cosier, MD;  Location: [facility];  Service: Gynecology;  Laterality: N/A;  Repeat Cesarean Section Delivery   Boy  @ 0005 , Apgars 8/9  .  Cholecystectomy 04/22/2012    Procedure: LAPAROSCOPIC CHOLECYSTECTOMY WITH INTRAOPERATIVE CHOLANGIOGRAM;  Surgeon: Mariella Saa, MD;  Location: [facility];  Service: General;  Laterality: N/A;    Family History  Problem Relation Age of Onset  . Anesthesia problems Neg Hx   . Hypertension Mother   . Diabetes Father   . Asthma Father    Social History:  reports that she has never smoked. She has never used smokeless tobacco. She reports that she does not drink alcohol or use illicit drugs.  Allergies:  Allergies  Allergen Reactions  . Aspirin Itching     (Not in a hospital admission)  Results for orders placed during the hospital encounter of 09/07/12 (from the past 48 hour(s))  URINALYSIS, MICROSCOPIC ONLY     Status: Abnormal   Collection Time   09/07/12  1:08 AM      Component Value Range Comment   Color, Urine YELLOW  YELLOW    APPearance CLOUDY (*) CLEAR    Specific Gravity, Urine 1.012  1.005 - 1.030    pH 7.0  5.0 - 8.0    Glucose, UA NEGATIVE  NEGATIVE mg/dL    Hgb urine dipstick NEGATIVE  NEGATIVE    Bilirubin Urine NEGATIVE  NEGATIVE    Ketones, ur NEGATIVE  NEGATIVE mg/dL    Protein, ur NEGATIVE  NEGATIVE mg/dL    Urobilinogen, UA 0.2  0.0 - 1.0 mg/dL    Nitrite NEGATIVE  NEGATIVE    Leukocytes, UA NEGATIVE  NEGATIVE    Squamous Epithelial / LPF  RARE  RARE   PREGNANCY, URINE     Status: Normal   Collection Time   09/07/12  1:08 AM      Component Value Range Comment   Preg Test, Ur NEGATIVE  NEGATIVE   CBC WITH DIFFERENTIAL     Status: Abnormal   Collection Time   09/07/12  1:15 AM      Component Value Range Comment   WBC 7.1  4.0 - 10.5 K/uL    RBC 5.55 (*) 3.87 - 5.11 MIL/uL    Hemoglobin 11.6 (*) 12.0 - 15.0 g/dL    HCT 40.9  81.1 - 91.4 %    MCV 69.5 (*) 78.0 - 100.0 fL    MCH 20.9 (*) 26.0 - 34.0 pg    MCHC 30.1  30.0 - 36.0 g/dL    RDW 78.2 (*) 95.6 - 15.5 %    Platelets 153  150 - 400 K/uL    Neutrophils Relative 47  43 - 77 %    Neutro Abs  3.3  1.7 - 7.7 K/uL    Lymphocytes Relative 44  12 - 46 %    Lymphs Abs 3.2  0.7 - 4.0 K/uL    Monocytes Relative 7  3 - 12 %    Monocytes Absolute 0.5  0.1 - 1.0 K/uL    Eosinophils Relative 2  0 - 5 %    Eosinophils Absolute 0.1  0.0 - 0.7 K/uL    Basophils Relative 0  0 - 1 %    Basophils Absolute 0.0  0.0 - 0.1 K/uL   COMPREHENSIVE METABOLIC PANEL     Status: Abnormal   Collection Time   09/07/12  1:15 AM      Component Value Range Comment   Sodium 138  135 - 145 mEq/L    Potassium 3.5  3.5 - 5.1 mEq/L    Chloride 103  96 - 112 mEq/L    CO2 26  19 - 32 mEq/L    Glucose, Bld 123 (*) 70 - 99 mg/dL    BUN 8  6 - 23 mg/dL    Creatinine, Ser 2.13 (*) 0.50 - 1.10 mg/dL    Calcium 08.6 (*) 8.4 - 10.5 mg/dL    Total Protein 7.3  6.0 - 8.3 g/dL    Albumin 3.7  3.5 - 5.2 g/dL    AST 14  0 - 37 U/L    ALT 15  0 - 35 U/L    Alkaline Phosphatase 116  39 - 117 U/L    Total Bilirubin 0.2 (*) 0.3 - 1.2 mg/dL    GFR calc non Af Amer >90  >90 mL/min    GFR calc Af Amer >90  >90 mL/min   LIPASE, BLOOD     Status: Normal   Collection Time   09/07/12  1:15 AM      Component Value Range Comment   Lipase 26  11 - 59 U/L   LACTIC ACID, PLASMA     Status: Normal   Collection Time   09/07/12  1:50 AM      Component Value Range Comment   Lactic Acid, Venous 1.0  0.5 - 2.2 mmol/L    Dg Abd Acute W/chest  09/07/2012  *RADIOLOGY REPORT*  Clinical Data: Lower abdominal pain, nausea, history of asthma and incisional hernia  ACUTE ABDOMEN SERIES (ABDOMEN 2 VIEW & CHEST 1 VIEW)  Comparison: 08/08/2012  Findings: Enlargement of cardiac silhouette. Mediastinal contours and pulmonary vascularity normal. Chronic peribronchial thickening.  No pulmonary infiltrate or pleural effusion. Surgical clips right upper quadrant question cholecystectomy. Nonobstructive bowel gas pattern. Small pelvic phleboliths. No bowel dilatation, bowel wall thickening, or free intraperitoneal air. No acute osseous findings.   IMPRESSION: Enlargement of cardiac silhouette. Chronic bronchitic changes. No acute abdominal findings.   Original Report Authenticated By: Ulyses Southward, M.D.     Review of Systems  Constitutional: Positive for fever. Negative for chills, weight loss, malaise/fatigue and diaphoresis.  HENT: Negative.   Eyes: Negative.   Respiratory: Negative.   Cardiovascular: Negative.   Gastrointestinal: Positive for nausea, vomiting and abdominal pain. Negative for heartburn, diarrhea, constipation, blood in stool and melena.  Genitourinary: Negative.   Musculoskeletal: Negative.   Skin: Negative.   Neurological: Negative.  Negative for weakness.  Endo/Heme/Allergies: Negative.   Psychiatric/Behavioral: Negative.     Blood pressure 155/96, pulse 85, temperature 98.3 F (36.8 C), temperature source Oral, resp. rate 18, SpO2 100.00%. Physical Exam  Constitutional: She is oriented to person, place, and time. She appears well-developed and well-nourished.  HENT:  Head: Normocephalic and atraumatic.  Mouth/Throat: No oropharyngeal exudate.  Eyes: Conjunctivae normal and EOM are normal. Pupils are equal, round, and reactive to light. Right eye exhibits no discharge. Left eye exhibits no discharge. No scleral icterus.  Neck: Normal range of motion. Neck supple. No JVD present. No tracheal deviation present. No thyromegaly present.  Cardiovascular: Normal rate, regular rhythm, normal heart sounds and intact distal pulses.  Exam reveals no gallop and no friction rub.   No murmur heard. Respiratory: Effort normal and breath sounds normal. No stridor. No respiratory distress. She has no wheezes. She has no rales. She exhibits no tenderness.  GI: Soft. Bowel sounds are normal. She exhibits no distension and no mass. There is tenderness. There is guarding. There is no rebound.  Musculoskeletal: Normal range of motion. She exhibits no edema and no tenderness.  Lymphadenopathy:    She has no cervical adenopathy.    Neurological: She is alert and oriented to person, place, and time.  Skin: Skin is warm and dry. No rash noted. She is not diaphoretic. No erythema. No pallor.  Psychiatric: She has a normal mood and affect.     Assessment/Plan Patient Active Problem List  Diagnosis  . Supervision of high-risk pregnancy  . Gestational diabetes  . Chronic hypertension complicating or reason for care during pregnancy  . Asthma  Incarcerated umbilical hernia  Plan: 1. Admit 2. Open repair with possible mesh of her incarcerated umbilical hernia 3. NPO 4. IVF and ABX (on call to operating room). 5. Pain management 6. DVT and GERD prophylaxis 7. Follow clinical course post op    Golda Acre [facility] Pager # [phone]  09/07/2012, 2:56 PM   Agree with above.  Will proceed with open ventral hernia repair, possibly with mesh, tonight.  There is a possibility that small bowel may be involved in the hernia, but less likely with a normal WBC.  I have spoken with Dr. Abbey Chatters who is assuming her care tonight.  Wilmon Arms. Corliss Skains, MD, [facility]  09/07/2012 5:30 PM

## 2012-09-07 NOTE — Progress Notes (Signed)
Patient seen and examined.  Plan emergency repair of incarcerated umbilical hernia +/- mesh.  This has been discussed with her as well as the possibility of recurrence.  After care was also discussed with her and her husband.

## 2012-09-07 NOTE — ED Notes (Signed)
Patient transported to X-ray 

## 2012-09-07 NOTE — ED Notes (Signed)
Pt c/o abd pain. Pt sts she has a hernia that is painful today.pain 10/10

## 2012-09-07 NOTE — Anesthesia Postprocedure Evaluation (Signed)
Anesthesia Post Note  Patient: Kylie Baker  Procedure(s) Performed: Procedure(s) (LRB): HERNIA REPAIR UMBILICAL ADULT (N/A)  Anesthesia type: General  Patient location: PACU  Post pain: Pain level controlled  Post assessment: Post-op Vital signs reviewed  Last Vitals:  Filed Vitals:   09/07/12 2200  BP: 132/74  Pulse: 76  Temp:   Resp: 13    Post vital signs: Reviewed  Level of consciousness: sedated  Complications: No apparent anesthesia complications

## 2012-09-07 NOTE — ED Notes (Signed)
Pt reports dx of umbilical hernia, was treated here last night, symptoms worse now

## 2012-09-07 NOTE — Transfer of Care (Signed)
Immediate Anesthesia Transfer of Care Note  Patient: Kylie Baker  Procedure(s) Performed: Procedure(s) (LRB) with comments: HERNIA REPAIR UMBILICAL ADULT (N/A) - Repair of Incisional Incarcarated hernia  Patient Location: PACU  Anesthesia Type:General  Level of Consciousness: awake, alert , oriented and patient cooperative  Airway & Oxygen Therapy: Patient Spontanous Breathing and Patient connected to face mask oxygen  Post-op Assessment: Report given to PACU RN, Post -op Vital signs reviewed and stable and Patient moving all extremities X 4  Post vital signs: stable  Complications: No apparent anesthesia complications

## 2012-09-07 NOTE — Anesthesia Preprocedure Evaluation (Signed)
Anesthesia Evaluation  Patient identified by MRN, date of birth, ID band Patient awake  General Assessment Comment:4 weeks post partum  Reviewed: Allergy & Precautions, H&P , NPO status , Patient's Chart, lab work & pertinent test results  Airway Mallampati: III TM Distance: <3 FB Neck ROM: Full    Dental No notable dental hx. (+) Teeth Intact and Dental Advisory Given   Pulmonary neg pulmonary ROS, asthma ,  breath sounds clear to auscultation  Pulmonary exam normal       Cardiovascular hypertension, Pt. on medications Rhythm:Regular Rate:Normal     Neuro/Psych negative neurological ROS  negative psych ROS   GI/Hepatic Neg liver ROS, GERD-  Medicated,  Endo/Other  diabetes (Gestational)Morbid obesity  Renal/GU negative Renal ROS  negative genitourinary   Musculoskeletal negative musculoskeletal ROS (+)   Abdominal   Peds negative pediatric ROS (+)  Hematology negative hematology ROS (+)   Anesthesia Other Findings   Reproductive/Obstetrics negative OB ROS                           Anesthesia Physical Anesthesia Plan  ASA: II and emergent  Anesthesia Plan: General   Post-op Pain Management:    Induction: Intravenous  Airway Management Planned: Oral ETT  Additional Equipment:   Intra-op Plan:   Post-operative Plan: Extubation in OR  Informed Consent: I have reviewed the patients History and Physical, chart, labs and discussed the procedure including the risks, benefits and alternatives for the proposed anesthesia with the patient or authorized representative who has indicated his/her understanding and acceptance.   Dental advisory given  Plan Discussed with: CRNA  Anesthesia Plan Comments:         Anesthesia Quick Evaluation

## 2012-09-07 NOTE — ED Provider Notes (Signed)
History     CSN: [id number]  Arrival date & time 09/07/12  0035   First MD Initiated Contact with Patient 09/07/12 0116      Chief Complaint  Patient presents with  . Abdominal Pain    (Consider location/radiation/quality/duration/timing/severity/associated sxs/prior treatment) HPI 36 year old female presents to emergency room from home with complaint of periumbilical abdominal pain. Patient seen in emergency department roughly a month ago, diagnosed with umbilical hernia. She reports onset of pain on Sunday which has steadily worsened. She denies any fever, no nausea no vomiting. She has had normal bowel movements. Patient is take Tylenol and oxycodone without improvement in symptoms. She has not yet followed up with surgery as advised. Patient is status post laparoscopic cholecystectomy. Umbilical hernia at site of port incision.   Past Medical History  Diagnosis Date  . Pregnancy induced hypertension   . Asthma   . Gestational diabetes 01/25/2012    Past Surgical History  Procedure Date  . Cesarean section 06/06/2010  . Appendectomy 1980's  . Cesarean section 03/27/2012    Procedure: CESAREAN SECTION;  Surgeon: Kathreen Cosier, MD;  Location: [facility];  Service: Gynecology;  Laterality: N/A;  Repeat Cesarean Section Delivery   Boy  @ 0005 , Apgars 8/9  . Cholecystectomy 04/22/2012    Procedure: LAPAROSCOPIC CHOLECYSTECTOMY WITH INTRAOPERATIVE CHOLANGIOGRAM;  Surgeon: Mariella Saa, MD;  Location: [facility];  Service: General;  Laterality: N/A;    Family History  Problem Relation Age of Onset  . Anesthesia problems Neg Hx   . Hypertension Mother   . Diabetes Father   . Asthma Father     History  Substance Use Topics  . Smoking status: Never Smoker   . Smokeless tobacco: Never Used  . Alcohol Use: No    OB History    Grav Para Term Preterm Abortions TAB SAB Ect Mult Living   3 2 1 1 1  0 1 0 0 2      Review of Systems   See History of Present Illness;  otherwise all other systems are reviewed and negative  Allergies  Aspirin  Home Medications   Current Outpatient Rx  Name  Route  Sig  Dispense  Refill  . ACETAMINOPHEN 500 MG PO TABS   Oral   Take 1,000 mg by mouth every 6 (six) hours as needed. Pain         . AMLODIPINE BESYLATE 10 MG PO TABS   Oral   Take 10 mg by mouth daily.           BP 168/120  Pulse 95  Temp 98.6 F (37 C) (Oral)  Resp 20  SpO2 98%  Physical Exam  Nursing note and vitals reviewed. Constitutional: She is oriented to person, place, and time. She appears well-developed and well-nourished. She appears distressed.       Obese, uncomfortable appearing, crying  HENT:  Head: Normocephalic and atraumatic.  Nose: Nose normal.  Mouth/Throat: Oropharynx is clear and moist.  Eyes: Conjunctivae normal and EOM are normal. Pupils are equal, round, and reactive to light.  Neck: Normal range of motion. Neck supple. No JVD present. No tracheal deviation present. No thyromegaly present.  Cardiovascular: Normal rate, regular rhythm, normal heart sounds and intact distal pulses.  Exam reveals no gallop and no friction rub.   No murmur heard. Pulmonary/Chest: Effort normal and breath sounds normal. No stridor. No respiratory distress. She has no wheezes. She has no rales. She exhibits no tenderness.  Abdominal: Soft. Bowel  sounds are normal. She exhibits no distension and no mass. There is tenderness (patient with umbilical pain with palpation. Hernia site noted, easily reducible, soft.  pain with deep palpation in this area). There is no rebound and no guarding.  Musculoskeletal: Normal range of motion. She exhibits no edema and no tenderness.  Lymphadenopathy:    She has no cervical adenopathy.  Neurological: She is alert and oriented to person, place, and time. She exhibits normal muscle tone. Coordination normal.  Skin: Skin is warm and dry. No rash noted. No erythema. No pallor.  Psychiatric: She has a normal  mood and affect. Her behavior is normal. Judgment and thought content normal.    ED Course  Procedures (including critical care time)  Labs Reviewed  CBC WITH DIFFERENTIAL - Abnormal; Notable for the following:    RBC 5.55 (*)     Hemoglobin 11.6 (*)     MCV 69.5 (*)     MCH 20.9 (*)     RDW 16.4 (*)     All other components within normal limits  COMPREHENSIVE METABOLIC PANEL - Abnormal; Notable for the following:    Glucose, Bld 123 (*)     Creatinine, Ser 0.41 (*)     Calcium 11.2 (*)     Total Bilirubin 0.2 (*)     All other components within normal limits  URINALYSIS, MICROSCOPIC ONLY - Abnormal; Notable for the following:    APPearance CLOUDY (*)     All other components within normal limits  LIPASE, BLOOD  LACTIC ACID, PLASMA  PREGNANCY, URINE   No results found.   1. Umbilical hernia       MDM   36 year old female with umbilical pain. Suspect secondary to previously diagnosed umbilical hernia. CT scan from last visit showed umbilical hernia containing omentum. Suspect possible strangulation of the omentum. We'll give pain medicine, check lactate and acute abdominal series.       Olivia Mackie, MD 09/07/12 [phone]

## 2012-09-07 NOTE — ED Provider Notes (Addendum)
History     CSN: [id number]  Arrival date & time 09/07/12  1407   First MD Initiated Contact with Patient 09/07/12 1422      No chief complaint on file.   (Consider location/radiation/quality/duration/timing/severity/associated sxs/prior treatment) HPI Comments: Patient comes in today with worsening abdominal pain. She was seen last night in the emergency department and was diagnosed with a reducible periumbilical hernia. She's had ongoing pain as well as nausea vomiting and subjective fever. She contacted the surgeon who advised her to come to the emergency department. We were notified by [facility] surgery to notify them when the patient arrived.   Past Medical History  Diagnosis Date  . Pregnancy induced hypertension   . Asthma   . Gestational diabetes 01/25/2012    Past Surgical History  Procedure Date  . Cesarean section 06/06/2010  . Appendectomy 1980's  . Cesarean section 03/27/2012    Procedure: CESAREAN SECTION;  Surgeon: Kathreen Cosier, MD;  Location: [facility];  Service: Gynecology;  Laterality: N/A;  Repeat Cesarean Section Delivery   Boy  @ 0005 , Apgars 8/9  . Cholecystectomy 04/22/2012    Procedure: LAPAROSCOPIC CHOLECYSTECTOMY WITH INTRAOPERATIVE CHOLANGIOGRAM;  Surgeon: Mariella Saa, MD;  Location: [facility];  Service: General;  Laterality: N/A;    Family History  Problem Relation Age of Onset  . Anesthesia problems Neg Hx   . Hypertension Mother   . Diabetes Father   . Asthma Father     History  Substance Use Topics  . Smoking status: Never Smoker   . Smokeless tobacco: Never Used  . Alcohol Use: No    OB History    Grav Para Term Preterm Abortions TAB SAB Ect Mult Living   3 2 1 1 1  0 1 0 0 2      Review of Systems  Constitutional: Positive for fever. Negative for chills, diaphoresis and fatigue.  HENT: Negative for congestion, rhinorrhea and sneezing.   Eyes: Negative.   Respiratory: Negative for cough, chest tightness and shortness  of breath.   Cardiovascular: Negative for chest pain and leg swelling.  Gastrointestinal: Positive for nausea, vomiting and abdominal pain. Negative for diarrhea and blood in stool.  Genitourinary: Negative for frequency, hematuria, flank pain and difficulty urinating.  Musculoskeletal: Negative for back pain and arthralgias.  Skin: Negative for rash.  Neurological: Negative for dizziness, speech difficulty, weakness, numbness and headaches.    Allergies  Aspirin  Home Medications   Current Outpatient Rx  Name  Route  Sig  Dispense  Refill  . ACETAMINOPHEN 500 MG PO TABS   Oral   Take 1,000 mg by mouth every 6 (six) hours as needed. Pain         . AMLODIPINE BESYLATE 10 MG PO TABS   Oral   Take 10 mg by mouth daily.         . OXYCODONE-ACETAMINOPHEN 5-325 MG PO TABS   Oral   Take 2 tablets by mouth every 4 (four) hours as needed for pain.   20 tablet   0     BP 155/96  Pulse 85  Temp 98.3 F (36.8 C) (Oral)  Resp 18  SpO2 100%  Physical Exam  Constitutional: She is oriented to person, place, and time. She appears well-developed and well-nourished.  HENT:  Head: Normocephalic and atraumatic.  Eyes: Pupils are equal, round, and reactive to light.  Neck: Normal range of motion. Neck supple.  Cardiovascular: Normal rate, regular rhythm and normal heart sounds.  Pulmonary/Chest: Effort normal and breath sounds normal. No respiratory distress. She has no wheezes. She has no rales. She exhibits no tenderness.  Abdominal: Soft. Bowel sounds are normal. There is tenderness (moderate tenderness to the periumbilical area with hernia palpated). There is no rebound and no guarding.  Musculoskeletal: Normal range of motion. She exhibits no edema.  Lymphadenopathy:    She has no cervical adenopathy.  Neurological: She is alert and oriented to person, place, and time.  Skin: Skin is warm and dry. No rash noted.  Psychiatric: She has a normal mood and affect.    ED Course    Procedures (including critical care time)  Labs Reviewed - No data to display Dg Abd Acute W/chest  09/07/2012  *RADIOLOGY REPORT*  Clinical Data: Lower abdominal pain, nausea, history of asthma and incisional hernia  ACUTE ABDOMEN SERIES (ABDOMEN 2 VIEW & CHEST 1 VIEW)  Comparison: 08/08/2012  Findings: Enlargement of cardiac silhouette. Mediastinal contours and pulmonary vascularity normal. Chronic peribronchial thickening. No pulmonary infiltrate or pleural effusion. Surgical clips right upper quadrant question cholecystectomy. Nonobstructive bowel gas pattern. Small pelvic phleboliths. No bowel dilatation, bowel wall thickening, or free intraperitoneal air. No acute osseous findings.  IMPRESSION: Enlargement of cardiac silhouette. Chronic bronchitic changes. No acute abdominal findings.   Original Report Authenticated By: Ulyses Southward, M.D.     Date: 09/07/2012  Rate: 78  Rhythm: normal sinus rhythm  QRS Axis: left  Intervals: normal  ST/T Wave abnormalities: nonspecific ST/T changes  Conduction Disutrbances:left anterior fascicular block  Narrative Interpretation:   Old EKG Reviewed: changes noted    1. Umbilical hernia       MDM  Surgery to see pt.        Rolan Bucco, MD 09/07/12 1448  Rolan Bucco, MD 09/07/12 [phone]

## 2012-09-08 ENCOUNTER — Encounter (HOSPITAL_COMMUNITY): Payer: Self-pay | Admitting: General Surgery

## 2012-09-08 ENCOUNTER — Encounter (INDEPENDENT_AMBULATORY_CARE_PROVIDER_SITE_OTHER): Payer: Self-pay | Admitting: General Surgery

## 2012-09-08 MED ORDER — OXYCODONE-ACETAMINOPHEN 5-325 MG PO TABS: 1.0000 | ORAL_TABLET | ORAL | Status: DC | PRN

## 2012-09-08 MED ORDER — PNEUMOCOCCAL VAC POLYVALENT 25 MCG/0.5ML IJ INJ
0.5000 mL | INJECTION | Freq: Once | INTRAMUSCULAR | Status: AC
  Administered 2012-09-08: 0.5 mL via INTRAMUSCULAR
  Filled 2012-09-08: qty 0.5

## 2012-09-08 MED ORDER — INFLUENZA VIRUS VACC SPLIT PF IM SUSP: 0.5000 mL | Freq: Once | INTRAMUSCULAR | Status: DC

## 2012-09-08 MED ORDER — PNEUMOCOCCAL VAC POLYVALENT 25 MCG/0.5ML IJ INJ: 0.5000 mL | INJECTION | Freq: Once | INTRAMUSCULAR | Status: DC

## 2012-09-08 MED ORDER — MORPHINE SULFATE 2 MG/ML IJ SOLN
2.0000 mg | INTRAMUSCULAR | Status: DC | PRN
  Administered 2012-09-08: 2 mg via INTRAVENOUS
  Filled 2012-09-08: qty 1

## 2012-09-08 MED ORDER — ONDANSETRON HCL 4 MG PO TABS: 4.0000 mg | ORAL_TABLET | Freq: Four times a day (QID) | ORAL | Status: DC | PRN

## 2012-09-08 MED ORDER — INFLUENZA VIRUS VACC SPLIT PF IM SUSP
0.5000 mL | Freq: Once | INTRAMUSCULAR | Status: AC
  Administered 2012-09-08: 0.5 mL via INTRAMUSCULAR
  Filled 2012-09-08: qty 0.5

## 2012-09-08 NOTE — Discharge Summary (Signed)
Physician Discharge Summary  Patient ID: Kylie Baker MRN: [id number] DOB/AGE: [id number] 37 y.o.  Admit date: 09/07/2012 Discharge date: 09/08/2012  Admission Diagnoses: Abdominal pain, incarcerated umbilical hernia   Discharge Diagnoses: s/p open repair of incarcerated umbilical hernia with mesh.   Patient Active Problem List  Diagnosis  . Supervision of high-risk pregnancy  . Gestational diabetes  . Chronic hypertension complicating or reason for care during pregnancy  . Asthma    Active Problems:  * No active hospital problems. *    Discharged Condition: stable  Hospital Course: Patient comes in today with worsening abdominal pain. She was seen last night in the emergency department and was diagnosed with a reducible periumbilical hernia. She's had ongoing pain as well as nausea vomiting and subjective fever.  Patient has had a recent laparoscopic cholecystectomy in July of this year by Dr. Johna Sheriff for cholelithiasis and cholecystitis. She did well post operatively and was discharged in stable condition. She has had swelling and pain at her umbilicus over the last few weeks, but it worsened yesterday. She was seen in the ED last night and discharged, but continues to have significant pain at her umbilicus.  We are asked to admit the patient today for surgical intervention of her incarcerated umbilical hernia. She will admitted to our service, made NPO, given IVF and and pain management, as well as pre-op ABX. The plan is for the patient to have an open repair of her incarcerated umbilical hernia this evening.  Patient underwent open incsional reduction of her incarcerated hernia by Dr. Abbey Chatters, at [facility] on 09/07/12. She has done well post op, remained hemodynamically stable, afebrile, progressed to a diet and her pain is well controlled on po meds. At this time she is stable for discharge at this time to home self care. She has been given written instructions as well as verbal  reinforcement concerning her post op care. She has verbalized understanding of these instructions. She has also been given our contact information should she need to contact our office prior to her scheduled appointment.  Consults: None  Significant Diagnostic Studies: labs.  Treatments: IV hydration, antibiotics,analgesia,respiratory therapy,and surgery.  Discharge Exam: Blood pressure 133/85, pulse 73, temperature 98 F (36.7 C), temperature source Oral, resp. rate 16, height 5\' 5"  (1.651 m), weight 238 lb 15.7 oz (108.4 kg), SpO2 97.00%. General appearance: alert, cooperative, appears stated age and no distress Chest: CTA Cardiac: RRR Abdomen: tender over surgical incision site, dressing is C/D/I +BS,flatus No N/V/D.  Disposition: 01-Home or Self Care  Discharge Orders    Future Orders Please Complete By Expires   Discharge patient      Comments:   Home/self care   Patient is to follow up with Dr. Abbey Chatters in 3 weeks time.  Please arrive 30 minutes earlier than your scheduled appointment time for check in procedures.  Thank you       Medication List     As of 09/08/2012 11:53 AM    STOP taking these medications         acetaminophen 500 MG tablet   Commonly known as: TYLENOL      TAKE these medications         amLODipine 10 MG tablet   Commonly known as: NORVASC   Take 10 mg by mouth daily.      ondansetron 4 MG tablet   Commonly known as: ZOFRAN   Take 1 tablet (4 mg total) by mouth every 6 (six) hours as needed for nausea.  oxyCODONE-acetaminophen 5-325 MG per tablet   Commonly known as: PERCOCET/ROXICET   Take 1-2 tablets by mouth every 4 (four) hours as needed.           Follow-up Information    Follow up with ROSENBOWER,TODD J, MD. In 3 weeks.   Contact information:   [address] [place] [address] [phone]          Signed: Blenda Mounts ACNP [facility] 09/08/2012, 11:53 AM

## 2012-09-08 NOTE — Discharge Summary (Signed)
Ready for discharge.  Follow-up with Dr. Abbey Chatters.  Wilmon Arms. Corliss Skains, MD, [facility]  09/08/2012 12:17 PM

## 2012-09-08 NOTE — Progress Notes (Signed)
Patient discharged to home.  Reviewed discharge instructions and medications with patient and family.  No further questions at this time.  Patient received flu and pneumovax vaccine before leaving.  Patient escorted to lobby via wheelchair.  Patent discharged.

## 2012-09-08 NOTE — Progress Notes (Deleted)
1 Day Post-Op  Subjective: Doing well this morning ambulating to bathroom, states that she is "sore" but better than she was pre-op. Tolerating clears, + BS No N/V/D. Dressing C/D/I  Objective: Vital signs in last 24 hours: Temp:  [98.3 F (36.8 C)-98.8 F (37.1 C)] 98.5 F (36.9 C) (11/20 0644) Pulse Rate:  [76-90] 77  (11/20 0644) Resp:  [12-18] 17  (11/20 0644) BP: (125-161)/(74-96) 125/86 mmHg (11/20 0644) SpO2:  [96 %-100 %] 97 % (11/20 0644) Weight:  [238 lb 15.7 oz (108.4 kg)] 238 lb 15.7 oz (108.4 kg) (11/20 0644)    Intake/Output from previous day: 11/19 0701 - 11/20 0700 In: 2000 [I.V.:2000] Out: 650 [Urine:650] Intake/Output this shift:    General appearance: alert, cooperative, appears stated age and no distress Chest: CTA Cardiac: RRR Abdomen: soft, approraitley tender, dressing C/D/I No c/o of N/V/D with diet. + BS Extremities: warm to touch, + pulses, no edema or tenderness.  Lab Results:   Basename 09/07/12 1525 09/07/12 0115  WBC 6.3 7.1  HGB 11.3* 11.6*  HCT 37.5 38.6  PLT 148* 153   BMET  Basename 09/07/12 1525 09/07/12 0115  NA -- 138  K -- 3.5  CL -- 103  CO2 -- 26  GLUCOSE -- 123*  BUN -- 8  CREATININE 0.47* 0.41*  CALCIUM -- 11.2*   PT/INR No results found for this basename: LABPROT:2,INR:2 in the last 72 hours ABG No results found for this basename: PHART:2,PCO2:2,PO2:2,HCO3:2 in the last 72 hours  Studies/Results: Dg Abd Acute W/chest  09/07/2012  *RADIOLOGY REPORT*  Clinical Data: Lower abdominal pain, nausea, history of asthma and incisional hernia  ACUTE ABDOMEN SERIES (ABDOMEN 2 VIEW & CHEST 1 VIEW)  Comparison: 08/08/2012  Findings: Enlargement of cardiac silhouette. Mediastinal contours and pulmonary vascularity normal. Chronic peribronchial thickening. No pulmonary infiltrate or pleural effusion. Surgical clips right upper quadrant question cholecystectomy. Nonobstructive bowel gas pattern. Small pelvic phleboliths. No bowel  dilatation, bowel wall thickening, or free intraperitoneal air. No acute osseous findings.  IMPRESSION: Enlargement of cardiac silhouette. Chronic bronchitic changes. No acute abdominal findings.   Original Report Authenticated By: Ulyses Southward, M.D.     Anti-infectives: Anti-infectives     Start     Dose/Rate Route Frequency Ordered Stop   09/08/12 0130   ceFAZolin (ANCEF) IVPB 1 g/50 mL premix  Status:  Discontinued        1 g 100 mL/hr over 30 Minutes Intravenous Every 6 hours 09/07/12 2217 09/08/12 0749   09/07/12 1600   ceFAZolin (ANCEF) 3 g in dextrose 5 % 50 mL IVPB        3 g 160 mL/hr over 30 Minutes Intravenous On call 09/07/12 1456 09/07/12 1923          Assessment/Plan:  Patient Active Problem List  Diagnosis  . Supervision of high-risk pregnancy  . Gestational diabetes  . Chronic hypertension complicating or reason for care during pregnancy  . Asthma  s/p Procedure(s) (LRB) with comments: HERNIA REPAIR UMBILICAL ADULT (N/A) - Repair of Incisional Incarcarated hernia with mesh. 1. Advance diet as tolerated 2. Ambulate/OOB   LOS: 1 day    Alexio Sroka 09/08/2012

## 2012-09-08 NOTE — Progress Notes (Signed)
Patient has done well over the night. Has had some pain, managed with pain meds. Patient has been OOB to bathroom. Encouraged splinting with a pillow when coughing, as it will ease the surgical discomfort. Patient has questions about discharge and when  She will go home. Patient has tolerated a clear liquid diet, and otherwise is stable.   09/08/2012 MCCLAIN, Kylie Baker 6:14 AM

## 2012-09-15 ENCOUNTER — Ambulatory Visit (INDEPENDENT_AMBULATORY_CARE_PROVIDER_SITE_OTHER): Payer: Self-pay

## 2012-09-15 DIAGNOSIS — K429 Umbilical hernia without obstruction or gangrene: Secondary | ICD-10-CM

## 2012-09-15 NOTE — Patient Instructions (Signed)
Pt is here today for staple removal after umbilical hernia repair.  Staples were removed and only one steri strips was placed over the wound.  There were several existing strips already in place.  Pt tolerated this well.  Protocol Vicodin #30 was called in to the [facility] on [address].

## 2012-10-05 ENCOUNTER — Ambulatory Visit (INDEPENDENT_AMBULATORY_CARE_PROVIDER_SITE_OTHER): Payer: Self-pay | Admitting: General Surgery

## 2012-10-05 ENCOUNTER — Encounter (INDEPENDENT_AMBULATORY_CARE_PROVIDER_SITE_OTHER): Payer: Self-pay | Admitting: General Surgery

## 2012-10-05 VITALS — BP 134/80 | HR 72 | Temp 97.9°F | Resp 18 | Ht 65.0 in | Wt 232.0 lb

## 2012-10-05 DIAGNOSIS — K430 Incisional hernia with obstruction, without gangrene: Secondary | ICD-10-CM

## 2012-10-05 NOTE — Progress Notes (Signed)
Procedure:  Emergency repair of incarcerated umbilical incisional hernia with mesh  Date:  09/07/2012  Pathology: na  History:  She is here for her first postoperative visit. She has some swelling and still has some soreness.  Exam: General- Is in NAD. Abdomen-subumbilical incision is clean. A moderate amount of swelling is present.  Assessment:  Progressing slowly but satisfactorily.  Plan:  Continue light activities. I told her the swelling would improve with time. Return visit in one month.

## 2012-10-05 NOTE — Patient Instructions (Signed)
Continue light activities. The swelling will get better.

## 2012-11-15 ENCOUNTER — Encounter (INDEPENDENT_AMBULATORY_CARE_PROVIDER_SITE_OTHER): Payer: Self-pay | Admitting: General Surgery

## 2012-11-18 ENCOUNTER — Encounter (INDEPENDENT_AMBULATORY_CARE_PROVIDER_SITE_OTHER): Payer: Self-pay | Admitting: General Surgery

## 2013-03-02 IMAGING — CR DG ABDOMEN ACUTE W/ 1V CHEST
4 series · 4 of 4 positions shown · non-contrast
Comparison: 08/08/2012

CLINICAL DATA: Lower abdominal pain, nausea, history of asthma and
incisional hernia

ACUTE ABDOMEN SERIES (ABDOMEN 2 VIEW & CHEST 1 VIEW)

[w chest pa]
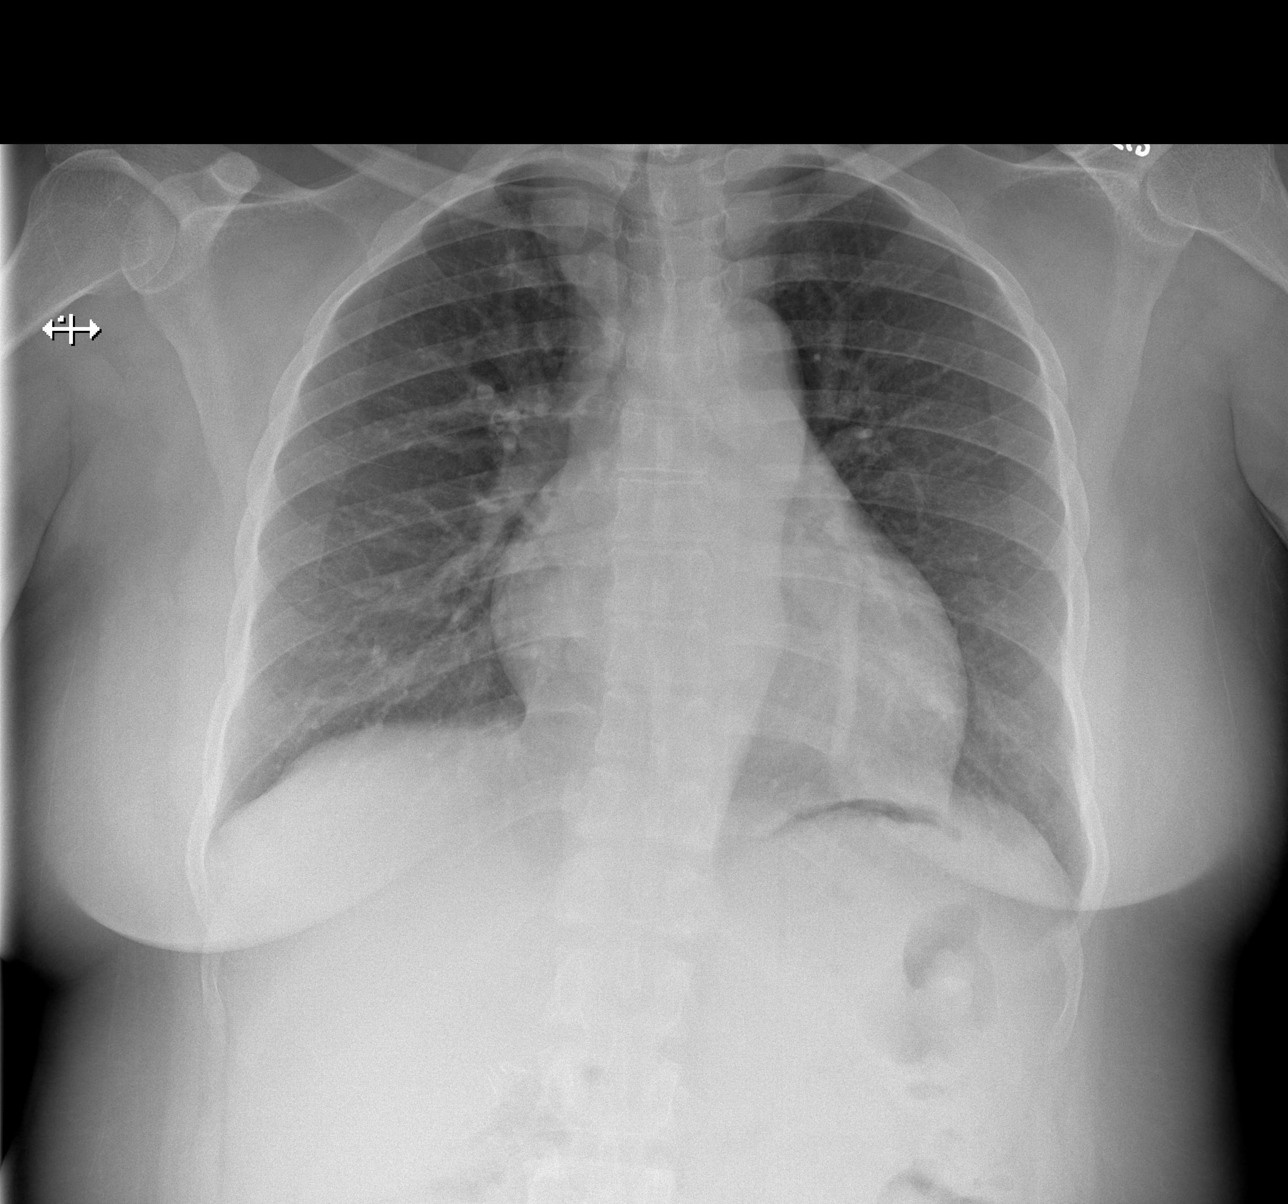

[w abdomen upright]
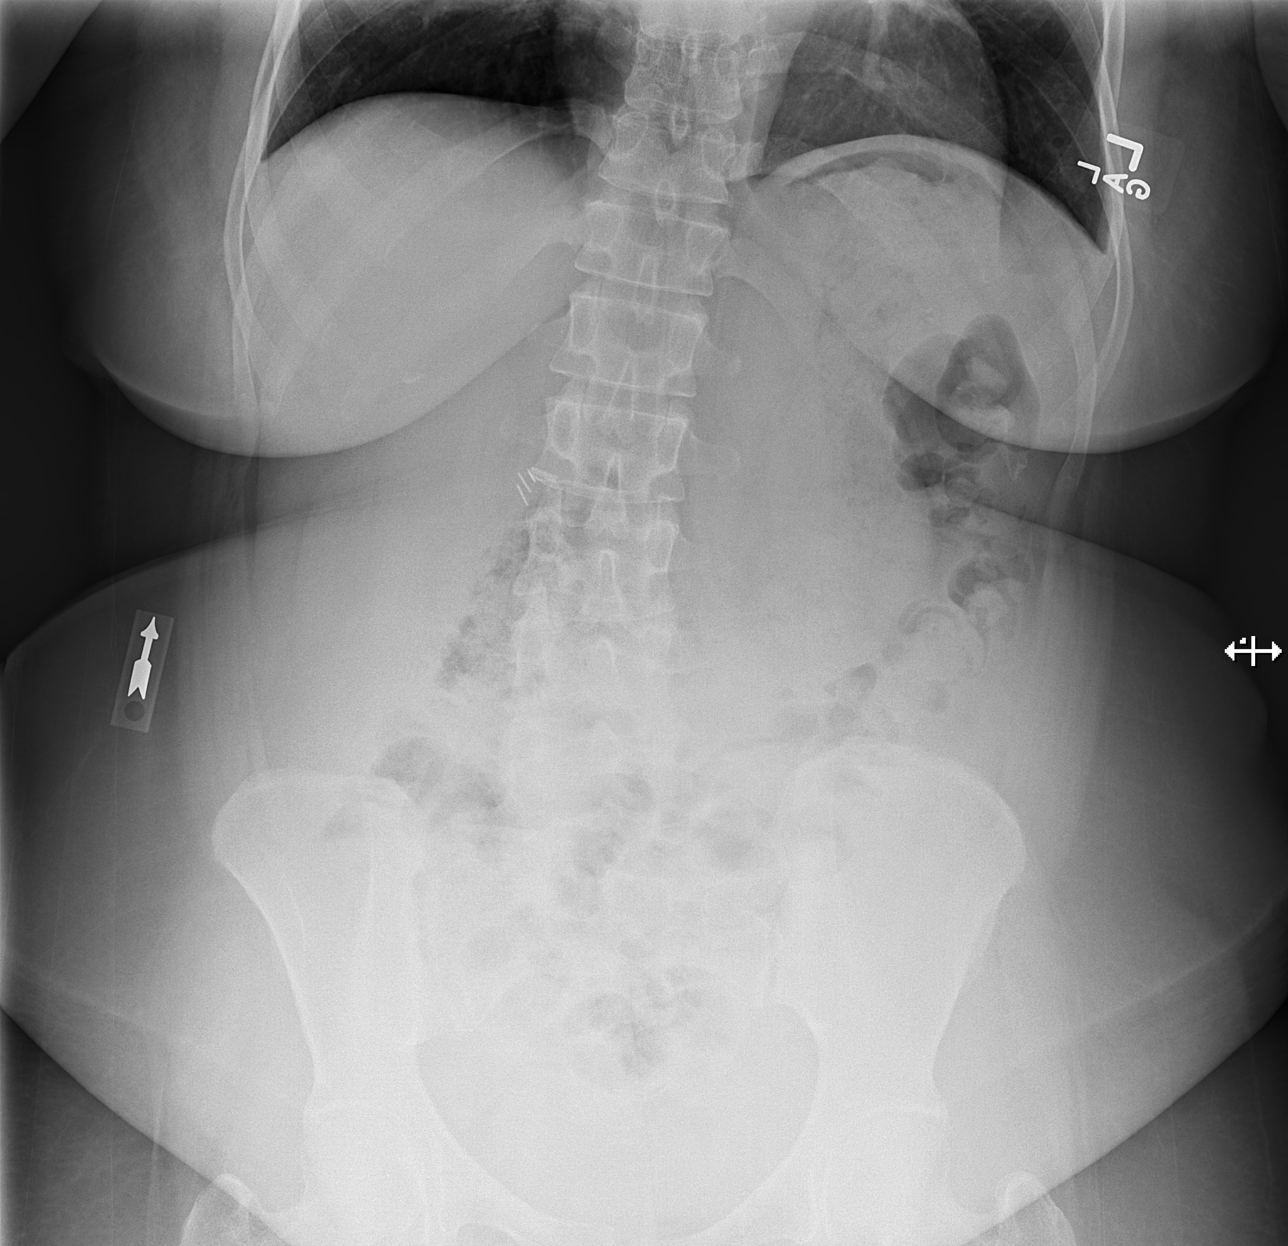

[t abdomen supine (1 of 2)]
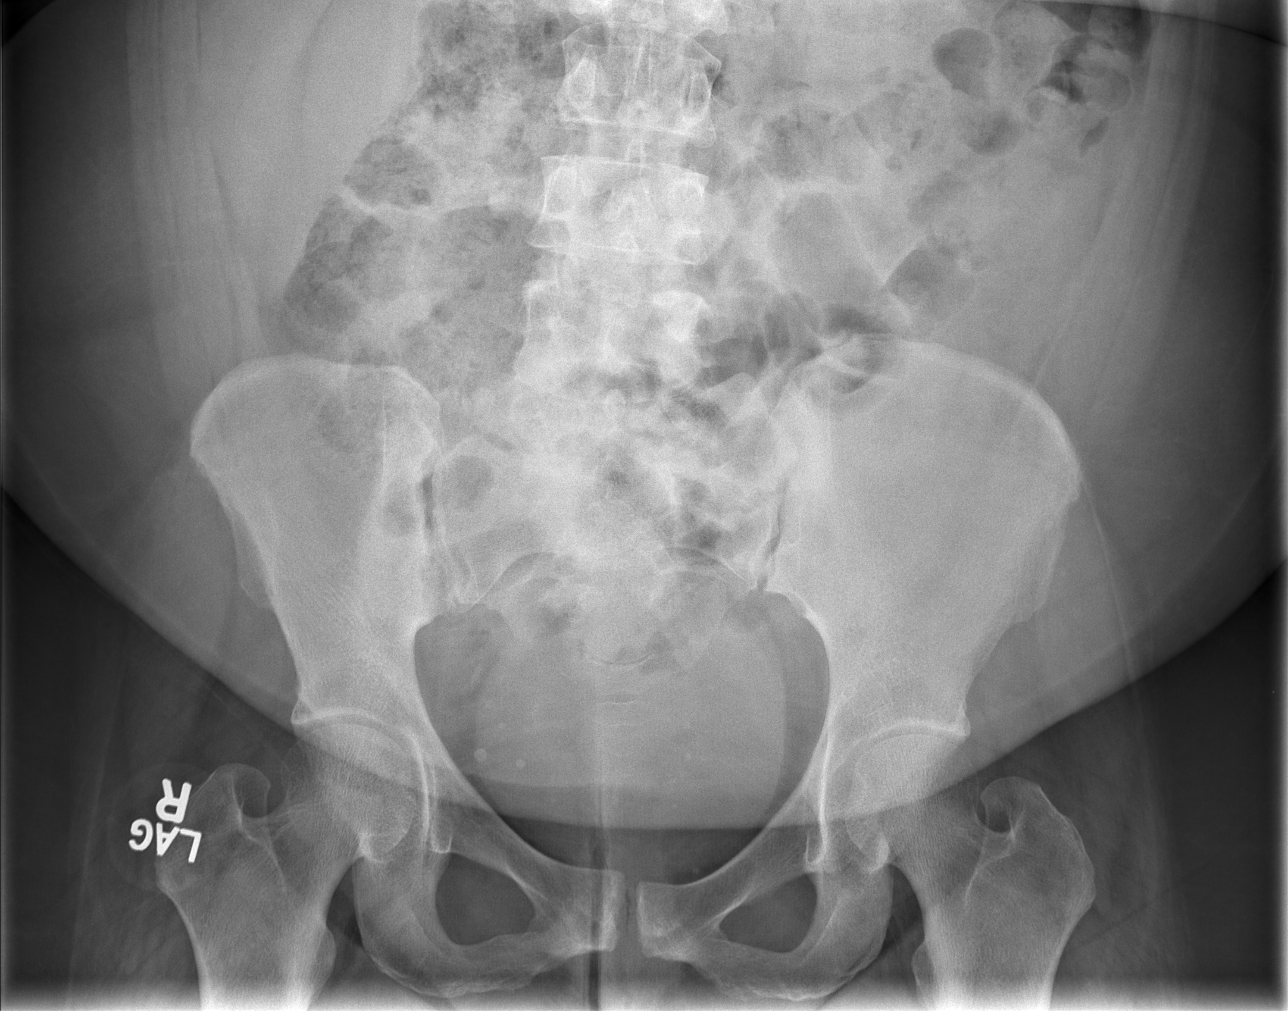

[t abdomen supine (2 of 2)]
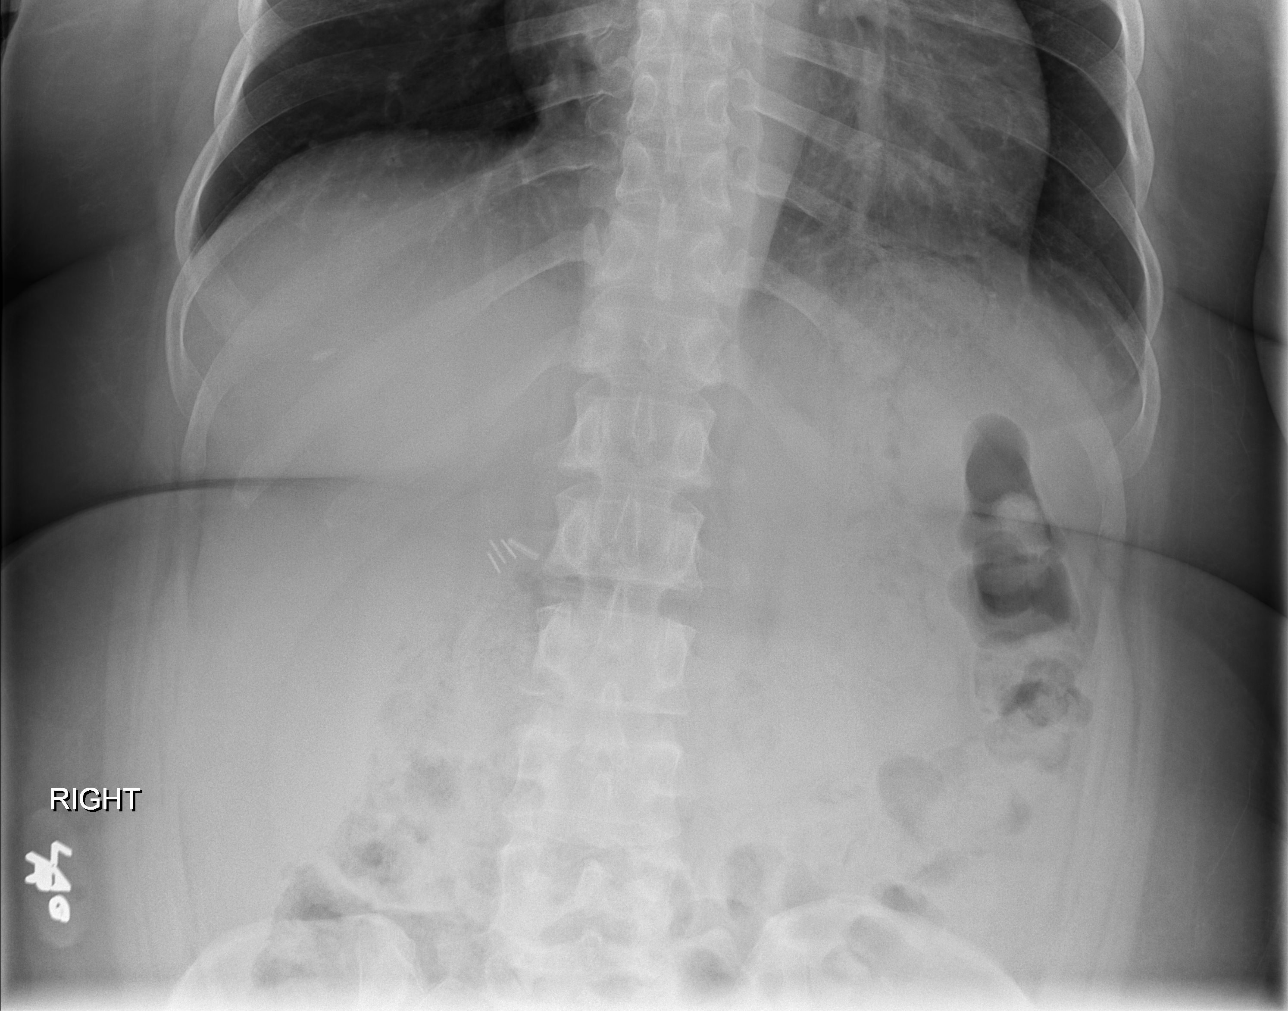

[4 of 4 positions shown; findings below may reference images not displayed]

FINDINGS: Enlargement of cardiac silhouette.
Mediastinal contours and pulmonary vascularity normal.
Chronic peribronchial thickening.
No pulmonary infiltrate or pleural effusion.
Surgical clips right upper quadrant question cholecystectomy.
Nonobstructive bowel gas pattern.
Small pelvic phleboliths.
No bowel dilatation, bowel wall thickening, or free intraperitoneal
air.
No acute osseous findings.
IMPRESSION: Enlargement of cardiac silhouette.
Chronic bronchitic changes.
No acute abdominal findings.

## 2013-08-25 ENCOUNTER — Other Ambulatory Visit: Payer: Self-pay

## 2014-02-09 ENCOUNTER — Encounter: Payer: Self-pay | Admitting: Internal Medicine

## 2014-02-09 ENCOUNTER — Ambulatory Visit (INDEPENDENT_AMBULATORY_CARE_PROVIDER_SITE_OTHER): Payer: Self-pay | Admitting: Internal Medicine

## 2014-02-09 DIAGNOSIS — R946 Abnormal results of thyroid function studies: Secondary | ICD-10-CM

## 2014-02-09 DIAGNOSIS — R7989 Other specified abnormal findings of blood chemistry: Secondary | ICD-10-CM

## 2014-02-09 DIAGNOSIS — E0590 Thyrotoxicosis, unspecified without thyrotoxic crisis or storm: Secondary | ICD-10-CM | POA: Insufficient documentation

## 2014-02-09 LAB — TSH: TSH: 0.03 u[IU]/mL — AB (ref 0.35–5.50)

## 2014-02-09 LAB — T3, FREE: T3, Free: 3.5 pg/mL (ref 2.3–4.2)

## 2014-02-09 LAB — PHOSPHORUS: PHOSPHORUS: 2.2 mg/dL — AB (ref 2.3–4.6)

## 2014-02-09 LAB — T4, FREE: Free T4: 1 ng/dL (ref 0.60–1.60)

## 2014-02-09 LAB — MAGNESIUM: MAGNESIUM: 1.9 mg/dL (ref 1.5–2.5)

## 2014-02-09 MED ORDER — FUROSEMIDE 20 MG PO TABS: 20.0000 mg | ORAL_TABLET | Freq: Every day | ORAL | Status: DC

## 2014-02-09 MED ORDER — LISINOPRIL 20 MG PO TABS: 20.0000 mg | ORAL_TABLET | Freq: Every day | ORAL | Status: DC

## 2014-02-09 NOTE — Patient Instructions (Signed)
Please stop at the lab. Stop Methimazole for now. Stop Lisinopril-HCTZ and start: - Lisinopril 20 mg daily - Furosemide 20 mg daily Please discuss with your PCP regarding heart evaluation. You may need to change from regular Metformin to Metformin extended release >> this may help with diarrhea. Please come back for a follow-up appointment in 2 months.  Hypercalcemia Hypercalcemia means the calcium in your blood is too high. Calcium in our blood is important for the control of many things, such as:  Blood clotting.  Conducting of nerve impulses.  Muscle contraction.  Maintaining teeth and bone health.  Other body functions. In the bloodstream, calcium maintains a constant balance with another mineral, phosphate. Calcium is absorbed into the body through the small intestine. This is helped by Vitamin D. Calcium levels are maintained mostly by vitamin D and a hormone (parathyroid hormone). But the kidneys also help. Hypercalcemia can happen when the concentration of calcium is too high for the kidneys to maintain balance. The body maintains a balance between the calcium we eat and the calcium already in our body. If calcium intake is increased or we cannot use calcium properly, there may be problems. Some common sources of calcium are:   Dairy products.  Nuts.  Eggs.  Whole grains.  Legumes.  Green leafy vegetables. CAUSES There are many causes of this condition, but some common ones are:  Hyperparathyroidism. This is an over activity of the parathyroid gland.  Cancers of the breast, kidney, lung, head and neck are common causes of calcium increases.  Medications that cause you to urinate more often (diuretics), nausea, vomiting and diarrhea also increase the calcium in the blood.  Overuse of calcium-containing antacids. SYMPTOMS  Many patients with mild hypercalcemia have no symptoms. For those with symptoms common problems include:  Loss of  appetite.  Constipation.  Increased thirst.  Heart rhythm changes.  Abnormal thinking.  Nausea.  Abdominal pain.  Kidney stones.  Mood swings.  Coma and death when severe.  Vomiting.  Increased urination.  High blood pressure.  Confusion. DIAGNOSIS   Your caregiver will do a medical history and perform a physical exam on you.  Calcium and parathyroid hormone (PTH) may be measured with a blood test. TREATMENT   The treatment depends on the calcium level and what is causing the higher level. Hypercalcemia can be lifethreatening. Fast lowering of the calcium level may be necessary.  With normal kidney function, fluids can be given by vein to clear the excess calcium. Hemodialysis works well to reduce dangerous calcium levels if there is poor kidney function. This is a procedure in which a machine is used to filter out unwanted substances. The blood is then returned to the body.  Drugs, such as diuretics, can be given after adequate fluid intake is established. These medications help the kidneys get rid of extra calcium. Drugs that lessen (inhibit) bone loss are helpful in gaining long-term control. Phosphate pills help lower high calcium levels caused by a low supply of phosphate. Anti-inflammatory agents such as steroids are helpful with some cancers and toxic levels of vitamin D.  Treatment of the underlying cause of the hypercalcemia will also correct the imbalance. Hyperparathyroidism is usually treated by surgical removal of one or more of the parathyroid glands and any tissue, other than the glands themselves, that is producing too much hormone.  The hypercalcemia caused by cancer is difficult to treat without controlling the cancer. Symptoms can be improved with fluids and drug therapy as outlined above.  PROGNOSIS   Surgery to remove the parathyroid glands is usually successful. This also depends on the amount of damage to the kidneys and whether or not it can be  treated.  Mild hypercalcemia can be controlled with good fluid intake and the use of effective medications.  Hypercalcemia often develops as a late complication of cancer. The expected outlook is poor without effective anticancer therapy. PREVENTION   If you are at risk for developing hypercalcemia, be familiar with early symptoms. Report these to your caregiver.  Good fluid intake (up to four quarts of liquid a day if possible) is helpful.  Try to control nausea and vomiting, and treat fevers to avoid dehydration.  Lowering the amount of calcium in your diet is not necessary. High blood calcium reduces absorption of calcium in the intestine.  Stay as active as possible. SEEK IMMEDIATE MEDICAL CARE IF:   You develop chest pain, sweating, or shortness of breath.  You get confused, feel faint or pass out.  You develop severe nausea and vomiting. MAKE SURE YOU:   Understand these instructions.  Will watch your condition.  Will get help right away if you are not doing well or get worse. Document Released: 12/20/2004 Document Revised: 01/31/2013 Document Reviewed: 10/01/2010 [facility] Patient Information 2014 [place], [place].

## 2014-02-09 NOTE — Progress Notes (Signed)
Patient ID: Kylie Baker, female   DOB: February 16, 1976, 38 y.o.   MRN: [id number]   HPI  Kylie Baker is a 38 y.o.-year-old female, referred by her PCP, [name], PA-C (Dr. Lerry Liner) , for evaluation for hypercalcemia/hyperparathyroidism and low TSH.   Hypercalcemia/HyperPTH: Pt was dx with hypercalcemia in 2013. I reviewed pt's pertinent labs: Lab Results  Component Value Date   CALCIUM 11.2* 09/07/2012   CALCIUM 10.6* 08/08/2012   CALCIUM 11.6* 04/21/2012   CALCIUM 11.6* 03/26/2012   CALCIUM 10.7* 02/29/2012   CALCIUM 11.3* 01/25/2012   CALCIUM 10.3 06/06/2010   CALCIUM 10.2 06/03/2010   CALCIUM 10.0 05/26/2010   CALCIUM 10.4 04/26/2010   Received recent labs from PCP: 11/23/2013: - Calcium 10.9 (8.4-10.5) - TSH 0.311 (0.350-4.5) - Hba1c 9.9% 01/18/2014: - Calcium 10.9 - PTH 132  - TSH 0.224  No h/o vitamin D deficiency, but no vit D levels available. Pt is not on calcium and vitamin D, she was on vitamin D from 2008 to 2011; she does not eat dairy dairy but eats green, leafy, vegetables.   She is on HCTZ 25 mg daily - started 1 year ago.  No previous DEXA scans.  No fractures or falls.   No h/o kidney stones.  No h/o CKD. Last BUN/Cr: 8/0.51 in 01/18/2014. Per Epic records: Lab Results  Component Value Date   BUN 8 09/07/2012   CREATININE 0.47* 09/07/2012   Pt does not have a FH of hypercalcemia, pituitary tumors, thyroid cancer, or osteoporosis.   I reviewed her chart and she also has a history of GDM and had HTN during pregnancy.  Low TSH: Last TFTS:  She c/o: - chest pain - started in 2012 - wake her up at night.  - no tremors - + weight loss (intentional). Exercises by walking 3x a week.  She started on MMI 5 mg daily 3 weeks ago.   No steroids, no contrasted CT scans, no meds or iodine supplement.   No radiation to head or neck.  No nodules in neck; + dysphagia/no odynophagia, no hoarseness, no SOB.  No FH of thyroid  ds.  ROS: Constitutional: + weight loss, + fatigue, no subjective hyperthermia/hypothermia Eyes: no blurry vision, no xerophthalmia ENT: no sore throat, no nodules palpated in throat, no dysphagia/odynophagia, no hoarseness Cardiovascular: + CP/no SOB/palpitations/leg swelling Respiratory: no cough/SOB Gastrointestinal: no N/V/+D (after starting metformin)/no C Musculoskeletal: no muscle/joint aches; + back pain Skin: no rashes Neurological: no tremors/numbness/tingling/dizziness Psychiatric: no depression/anxiety Low libido  Past Medical History  Diagnosis Date  . Pregnancy induced hypertension   . Asthma   . Gestational diabetes 01/25/2012   Past Surgical History  Procedure Laterality Date  . Cesarean section  06/06/2010  . Appendectomy  1980's  . Cesarean section  03/27/2012    Procedure: CESAREAN SECTION;  Surgeon: Kathreen Cosier, MD;  Location: [facility];  Service: Gynecology;  Laterality: N/A;  Repeat Cesarean Section Delivery   Boy  @ 0005 , Apgars 8/9  . Cholecystectomy  04/22/2012    Procedure: LAPAROSCOPIC CHOLECYSTECTOMY WITH INTRAOPERATIVE CHOLANGIOGRAM;  Surgeon: Mariella Saa, MD;  Location: [facility];  Service: General;  Laterality: N/A;  . Umbilical hernia repair  09/07/2012    Procedure: HERNIA REPAIR UMBILICAL ADULT;  Surgeon: Adolph Pollack, MD;  Location: [facility];  Service: General;  Laterality: N/A;  Repair of Incisional Incarcarated hernia   History   Social History  . 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 Smoking status: Never Smoker   . Smokeless tobacco: Never Used  . Alcohol Use: No  . Drug Use: No  . Sexual Activity: Yes    Birth Control/ Protection: None   Current Outpatient Prescriptions on File Prior to Visit  Medication Sig Dispense Refill  . amLODipine (NORVASC) 10 MG tablet Take 5 mg by mouth daily.       .  influenza  inactive virus vaccine (FLUZONE/FLUARIX) injection Inject 0.5 mLs into the muscle once.  0.25 mL  0  . pneumococcal 23 valent vaccine (PNU-IMMUNE) 25 MCG/0.5ML injection Inject 0.5 mLs into the muscle once.  2.5 mL  0   No current facility-administered medications on file prior to visit.   Allergies  Allergen Reactions  . Aspirin Itching   Family History  Problem Relation Age of Onset  . Anesthesia problems Neg Hx   . Hypertension Mother   . Diabetes Father   . Asthma Father    PE: BP 122/80  Pulse 91  Temp(Src) 98.3 F (36.8 C) (Oral)  Resp 12  Ht 5' 4.5" (1.638 m)  Wt 236 lb (107.049 kg)  BMI 39.90 kg/m2  SpO2 98% Wt Readings from Last 3 Encounters:  02/09/14 236 lb (107.049 kg)  10/05/12 232 lb (105.235 kg)  09/08/12 238 lb 15.7 oz (108.4 kg)   Constitutional: overweight, in NAD. No kyphosis. Eyes: PERRLA, EOMI, no exophthalmos ENT: moist mucous membranes, no thyromegaly, no cervical lymphadenopathy Cardiovascular: RRR, No MRG Respiratory: CTA B Gastrointestinal: abdomen soft, NT, ND, BS+ Musculoskeletal: no deformities, strength intact in all 4 Skin: moist, warm, no rashes Neurological: no tremor with outstretched hands, DTR normal in all 4  Assessment: 1. Hypercalcemia/hyperparathyroidism  2. Low TSH  Plan: 1. Hypercalcemia/hyperparathyroidism Patient has had repeated instances of elevated calcium, with the highest level being at 11.6. An intact PTH level was also high, at 132, for a calcium of 10.9 on 01/18/2014. It is unclear whether she has vitamin D deficiency, however, it is very likely that she does have a parathyroid adenoma based on the high PTH level with a borderline/high calcium.  No apparent complications from hypercalcemia: no h/o nephrolithiasis, no osteoporosis, no fractures. No abdominal pain, depression, bone pain. - I discussed with the patient about the physiology of calcium and parathyroid hormone, and possible side effects from  increased PTH, including kidney stones, osteoporosis, abdominal pain, etc.  - she meets 2 criteria for parathyroid surgery:  Increased calcium by more than 1 mg/dL above the upper limit of normal  Age <68 years old Kidney ds.  Osteoporosis  - However, we will first need to make sure that her elevated PTH level is a primary, not a secondary problem. For this, I will stop HCTZ (will switch to Lasix) and I will check: calcium level intact PTH ([facility]) Magnesium Phosphorus vitamin D We may need a 24h urine calcium collection for calcium, if the above are normal. - If the tests indicate a parathyroid adenoma, I will refer her to surgery. I will wait for the results of the above labs and will discuss with the plan with the patient.  - I will see the patient back in 2 months  2. Low TSH - she had 2 instances of a low TSH per records from TSH: 0.224 and 0.311. She was started on a low dose MMI (5 mg daily). -  will recheck today, along with free t4 and free T3 and will stop MMI for now.  - We may need a repeat in labs in 1 mo or at next visit, depending on the labs today.  I advised to d/w PCP about her CP. I do not think this is caused by her very mild decrease in TSH. If we do need to go ahead with parathyroid surgery, may need a cardiology eval.  Office Visit on 02/09/2014  Component Date Value Ref Range Status  . Vit D, 25-Hydroxy 02/09/2014 16* 30 - 89 ng/mL Final   Comment: This assay accurately quantifies Vitamin D, which is the sum of the                          25-Hydroxy forms of Vitamin D2 and D3.  Studies have shown that the                          optimum concentration of 25-Hydroxy Vitamin D is 30 ng/mL or higher.                           Concentrations of Vitamin D between 20 and 29 ng/mL are considered to                          be insufficient and concentrations less than 20 ng/mL are considered                          to be deficient for Vitamin D.  . Calcium 02/09/2014  10.9* 8.7 - 10.2 mg/dL Final  . PTH 57/84/696204/23/2015 61  15 - 65 pg/mL Final  . PTH 02/09/2014 Comment   Final   Comment: Interpretation                 Intact PTH    Calcium                                                          (pg/mL)      (mg/dL)                          Normal                          15 - 65     8.6 - 10.2                          Primary Hyperparathyroidism         >65          >10.2                          Secondary Hyperparathyroidism       >65          <10.2                          Non-Parathyroid Hypercalcemia       <65          >  10.2                          Hypoparathyroidism                  <15          < 8.6                          Non-Parathyroid Hypocalcemia    15 - 65          < 8.6  . Phosphorus 02/09/2014 2.2* 2.3 - 4.6 mg/dL Final  . Magnesium 41/32/440104/23/2015 1.9  1.5 - 2.5 mg/dL Final  . TSH 02/72/536604/23/2015 0.03* 0.35 - 5.50 uIU/mL Final  . Free T4 02/09/2014 1.00  0.60 - 1.60 ng/dL Final  . T3, Free 44/03/474204/23/2015 3.5  2.3 - 4.2 pg/mL Final  . TSI 02/09/2014 30  <140 % baseline Final   Comment: Thyroid stimulating immunoglobulins (TSI) can engage                          the TSH receptors resulting in hyperthyroidism in                          Graves' disease patients. TSI levels can be useful in                          monitoring the clinical outcome of Graves' disease as                          well as assessing the potential for hyperthyroidism                          from maternal-fetal transfer. TSI results greater than                          or equal to (>=) 140% of the Reference Control are                          considered positive.                          NOTE:                          A serum TSH level greater than 350 micro-International                          Units/mL can interfere with the TSI bioassay and                          potentially give false positive results.                          Patients who are pregnant and are suspected  of having                          hyperthyroidism should have both TSI and human  Chorionic Gonadotropin(hCG) tests measured. A serum                          hCG level greater than 40,625 mIU/mL can interfere                          with the TSI bioassay and may give false negative                          results. In these patients it is recommended that                          a second TSI be obtained when the hCG concentration                          falls below 40,625 mIU/mL (usually after approximately                          20-weeks gestation).                          The performance characteristics of this assay have been                          determined by [facility].                          Performance characteristics refer to the analytical                          performance of the test.   Very low vitamin D >> start on 2000 IU vit D3 daily >> RTC in 2 mo for recheck. Phos also low, which points toward a likely primary parathyroid problem, but will need to normalize vit D level before rechecking. TSH low, but free t4 and free T3 normal (Subclinical hyperthyroidism) >> recheck TFTs when she comes back in 2 mo.

## 2014-02-10 LAB — PTH, INTACT AND CALCIUM
Calcium: 10.9 mg/dL — ABNORMAL HIGH (ref 8.7–10.2)
PTH: 61 pg/mL (ref 15–65)

## 2014-02-10 LAB — VITAMIN D 25 HYDROXY (VIT D DEFICIENCY, FRACTURES): Vit D, 25-Hydroxy: 16 ng/mL — ABNORMAL LOW (ref 30–89)

## 2014-02-15 LAB — THYROID STIMULATING IMMUNOGLOBULIN: TSI: 30 % baseline (ref <140)

## 2014-04-13 ENCOUNTER — Encounter: Payer: Self-pay | Admitting: Internal Medicine

## 2014-04-13 ENCOUNTER — Ambulatory Visit (INDEPENDENT_AMBULATORY_CARE_PROVIDER_SITE_OTHER): Payer: Self-pay | Admitting: Internal Medicine

## 2014-04-13 DIAGNOSIS — I10 Essential (primary) hypertension: Secondary | ICD-10-CM | POA: Insufficient documentation

## 2014-04-13 DIAGNOSIS — E559 Vitamin D deficiency, unspecified: Secondary | ICD-10-CM

## 2014-04-13 DIAGNOSIS — E0590 Thyrotoxicosis, unspecified without thyrotoxic crisis or storm: Secondary | ICD-10-CM

## 2014-04-13 LAB — T3, FREE: T3 FREE: 2.9 pg/mL (ref 2.3–4.2)

## 2014-04-13 LAB — TSH: TSH: 0.01 u[IU]/mL — ABNORMAL LOW (ref 0.35–4.50)

## 2014-04-13 LAB — T4, FREE: Free T4: 0.84 ng/dL (ref 0.60–1.60)

## 2014-04-13 MED ORDER — FUROSEMIDE 20 MG PO TABS: 20.0000 mg | ORAL_TABLET | Freq: Every day | ORAL | Status: DC

## 2014-04-13 NOTE — Progress Notes (Addendum)
Patient ID: Dallie PilesDoris Conkright, female   DOB: 1976/01/13, 38 y.o.   MRN: [id number]   HPI  Dallie PilesDoris Brogan is a 38 y.o.-year-old female, initially referred by her PCP, Alois Clicheracey Aguilar, PA-C (Dr. Lerry Linerwight Williams) , for evaluation for hypercalcemia/hyperparathyroidism and low TSH. Last visit 2 mo ago.  Hypercalcemia/HyperPTH: Pt was dx with hypercalcemia in 2013. I reviewed pt's pertinent labs - last set drawn while on HCTZ: Lab Results  Component Value Date   PTH 61 02/09/2014   PTH Comment 02/09/2014   CALCIUM 10.9* 02/09/2014   CALCIUM 11.2* 09/07/2012   CALCIUM 10.6* 08/08/2012   CALCIUM 11.6* 04/21/2012   CALCIUM 11.6* 03/26/2012   CALCIUM 10.7* 02/29/2012   CALCIUM 11.3* 01/25/2012   CALCIUM 10.3 06/06/2010   CALCIUM 10.2 06/03/2010   CALCIUM 10.0 05/26/2010   Previous labs from PCP: 11/23/2013: - Calcium 10.9 (8.4-10.5) - TSH 0.311 (0.350-4.5) - Hba1c 9.9% 01/18/2014: - Calcium 10.9 - PTH 132  - TSH 0.224 No previous DEXA scans. No fractures or falls.  No h/o kidney stones.  Vit D deficiency A vit D level drawn at last visit, returned low (16) >> I advised her through MyChart to start vit D 2000 IU daily and to come back to recheck now. She did not read the message so she did not start the vit D supplementation.   HTN: We also stopped HCTZ 25 mg daily at last visit - started 1 year ago. She is now on Lasix >> BP normal today: 124/80.  No h/o CKD. Last BUN/Cr: 8/0.51 in 01/18/2014. Per Epic records: Lab Results  Component Value Date   BUN 8 09/07/2012   CREATININE 0.47* 09/07/2012   Low TSH: Last TFTS: Lab Results  Component Value Date   TSH 0.03* 02/09/2014   FREET4 1.00 02/09/2014  TSI 30 (<140%). She started on MMI 5 mg daily 3 weeks prior to checking the above labs. I advised her to stop the MMI and will recheck labs today.  She c/o: - no more chest pain - no palpitations - no tremors - + weight loss (intentional) - 9 lbs since last visit (diet).  Exercises by walking  3x a week.  No nodules in neck; + dysphagia/no odynophagia, no hoarseness, no SOB.  ROS: Constitutional: + weight loss, no fatigue, no subjective hyperthermia/hypothermia, + poor sleep Eyes: no blurry vision, no xerophthalmia ENT: no sore throat, no nodules palpated in throat, no dysphagia/odynophagia, no hoarseness Cardiovascular: no CP/no SOB/palpitations/leg swelling Respiratory: no cough/SOB Gastrointestinal: no N/V/+D (after starting metformin)/no C Musculoskeletal: no muscle/joint aches Skin: no rashes Neurological: no tremors/numbness/tingling/dizziness + low libido  I reviewed pt's medications, allergies, PMH, social hx, family hx and no changes required, except as mentioned above.  PE: BP 124/80  Pulse 87  Temp(Src) 98.9 F (37.2 C) (Oral)  Resp 12  Wt 227 lb (102.967 kg)  SpO2 97% Wt Readings from Last 3 Encounters:  04/13/14 227 lb (102.967 kg)  02/09/14 236 lb (107.049 kg)  10/05/12 232 lb (105.235 kg)   Constitutional: overweight, in NAD. No kyphosis. Eyes: PERRLA, EOMI, no exophthalmos ENT: moist mucous membranes, + thyromegaly, no cervical lymphadenopathy Cardiovascular: RRR, No MRG Respiratory: CTA B Gastrointestinal: abdomen soft, NT, ND, BS+ Musculoskeletal: no deformities, strength intact in all 4 Skin: moist, warm, no rashes, + hirsutism chest and chin. Neurological: no tremor with outstretched hands, DTR normal in all 4  Assessment: 1. Hypercalcemia/hyperparathyroidism  2. Vit D deficiency  2. Low TSH  3. HTN  Plan: 1. Hypercalcemia/hyperparathyroidism and 2.  Vit D deficiency Patient has had repeated instances of elevated calcium, with the highest level being at 11.6. An intact PTH level was also high, at 132, for a calcium of 10.9 on 01/18/2014. She has vitamin D deficiency at 47 >> did not start supplementation as advised as she did not see the message >> discussed with her that we need to replete this before rechecking calcium and PTH. No  apparent complications from hypercalcemia: no h/o nephrolithiasis, no osteoporosis, no fractures. No abdominal pain, depression, bone pain. - she meets 2 criteria for parathyroid surgery:  Increased calcium by more than 1 mg/dL above the upper limit of normal  Age <6 years old Kidney ds.  Osteoporosis  - we will first need to make sure that her elevated PTH level is a primary, not a secondary problem. Now that she is off HCTZ, we can recheck the PTH and calcium, but will do this after we get her vit D WNL - If the tests indicate a parathyroid adenoma, I will refer her to surgery. I will wait for the results of the above labs and will discuss with the plan with the patient.  - I will see the patient back in 4 months, but come back in 2 months for labs: Ca, PTH, vit D. She had slightly low phos at last visit (would point towards primary HPTH)  3. Low TSH - she had 2 instances of a low TSH per records from TSH: 0.224 and 0.311. She was started on a low dose MMI (5 mg daily), which we stopped at last visit - will recheck TSH today, along with free t4 and free T3  - she has a large thyroid gland and may need an uptake and scan or a thyroid U/S depending on the TSH level    4. HTN - BP normal today - will refill Lasix  Patient Instructions  Please start vitamin D 2000 units daily and come back for labs in 2 months.  Please stop at the lab for thyroid tests. Please talk to the schedulers about getting a new code for MyChart.  Please return in 4 months.  Office Visit on 04/13/2014  Component Date Value Ref Range Status  . TSH 04/13/2014 0.01* 0.35 - 4.50 uIU/mL Final  . Free T4 04/13/2014 0.84  0.60 - 1.60 ng/dL Final  . T3, Free 29/56/2130 2.9  2.3 - 4.2 pg/mL Final  TSH still low >> will check a thyroid uptake and scan.  CLINICAL DATA: Hyperthyroidism.  EXAM: THYROID SCAN AND UPTAKE - 24 HOURS  TECHNIQUE: Following the per oral administration of I-131 sodium iodide, the patient  returned at 24 hours and uptake measurements were acquired with the uptake probe centered on the neck. Thyroid imaging was performed following the intravenous administration of the Tc-28m Pertechnetate.  RADIOPHARMACEUTICALS: 8.6 microCuries I-131 Sodium Iodide and 9.8 mCi TC-69m Pertechnetate  COMPARISON: None  FINDINGS: The 24 hr radioactive iodine uptake is equal to 26.6%.  On the thyroid scan the gland appears enlarged. There is heterogeneous radiotracer uptake throughout both lobes of the enlarged thyroid gland with a more focal area of increased uptake localizing to the inferior pole of the right lobe. No dominant cold nodule identified.  IMPRESSION: 1. The 24 hr radioactive iodine uptake is at the upper limits of normal at 27%. 2. 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   Electronically Signed By: Signa Kell M.D. On: 06/06/2014 14:44  06/12/2014 Will need a repeat set of thyroid labs along with a  vit D level, PTH/Ca and urinary 24h calcium. Will also schedule another appt to discuss above results and further plan.

## 2014-04-13 NOTE — Patient Instructions (Signed)
Please start vitamin D 2000 units daily and come back for labs in 2 months.  Please stop at the lab for thyroid tests. Please talk to the schedulers about getting a new code for MyChart.  Please return in 4 months.

## 2014-04-14 ENCOUNTER — Encounter: Payer: Self-pay | Admitting: Internal Medicine

## 2014-05-19 ENCOUNTER — Telehealth: Payer: Self-pay | Admitting: Internal Medicine

## 2014-06-05 ENCOUNTER — Encounter (HOSPITAL_COMMUNITY)
Admission: RE | Admit: 2014-06-05 | Discharge: 2014-06-05 | Disposition: A | Discharge status: Home / Self Care | Payer: Self-pay | Source: Ambulatory Visit | Attending: Internal Medicine | Admitting: Internal Medicine

## 2014-06-05 DIAGNOSIS — E0590 Thyrotoxicosis, unspecified without thyrotoxic crisis or storm: Secondary | ICD-10-CM | POA: Insufficient documentation

## 2014-06-06 ENCOUNTER — Encounter (HOSPITAL_COMMUNITY)
Admission: RE | Admit: 2014-06-06 | Discharge: 2014-06-06 | Disposition: A | Discharge status: Home / Self Care | Payer: Self-pay | Source: Ambulatory Visit | Attending: Internal Medicine | Admitting: Internal Medicine

## 2014-06-06 DIAGNOSIS — E0590 Thyrotoxicosis, unspecified without thyrotoxic crisis or storm: Secondary | ICD-10-CM | POA: Insufficient documentation

## 2014-06-06 MED ORDER — SODIUM IODIDE I 131 CAPSULE
8.6000 | Freq: Once | INTRAVENOUS | Status: AC | PRN
  Administered 2014-06-06: 8.6 via ORAL

## 2014-06-06 MED ORDER — SODIUM PERTECHNETATE TC 99M INJECTION
9.8000 | Freq: Once | INTRAVENOUS | Status: AC | PRN
  Administered 2014-06-06: 10 via INTRAVENOUS

## 2014-06-18 ENCOUNTER — Other Ambulatory Visit: Payer: Self-pay | Admitting: Internal Medicine

## 2014-07-13 ENCOUNTER — Other Ambulatory Visit: Payer: Self-pay | Admitting: Internal Medicine

## 2014-08-15 ENCOUNTER — Ambulatory Visit: Payer: Self-pay | Admitting: Internal Medicine

## 2014-08-21 ENCOUNTER — Encounter: Payer: Self-pay | Admitting: Internal Medicine

## 2014-08-30 ENCOUNTER — Other Ambulatory Visit: Payer: Self-pay | Admitting: Internal Medicine

## 2014-09-22 ENCOUNTER — Other Ambulatory Visit: Payer: Self-pay | Admitting: Internal Medicine

## 2014-10-18 NOTE — Telephone Encounter (Signed)
error 

## 2015-02-09 ENCOUNTER — Other Ambulatory Visit: Payer: Self-pay | Admitting: Internal Medicine

## 2015-03-27 ENCOUNTER — Ambulatory Visit (INDEPENDENT_AMBULATORY_CARE_PROVIDER_SITE_OTHER): Payer: Self-pay | Admitting: Internal Medicine

## 2015-03-27 ENCOUNTER — Encounter: Payer: Self-pay | Admitting: Internal Medicine

## 2015-03-27 VITALS — BP 128/64 | HR 93 | Temp 98.5°F | Resp 14 | Wt 238.0 lb

## 2015-03-27 DIAGNOSIS — E559 Vitamin D deficiency, unspecified: Secondary | ICD-10-CM

## 2015-03-27 DIAGNOSIS — E0590 Thyrotoxicosis, unspecified without thyrotoxic crisis or storm: Secondary | ICD-10-CM

## 2015-03-27 LAB — VITAMIN D 25 HYDROXY (VIT D DEFICIENCY, FRACTURES): VITD: 12.22 ng/mL — ABNORMAL LOW (ref 30.00–100.00)

## 2015-03-27 LAB — T3, FREE: T3 FREE: 3.7 pg/mL (ref 2.3–4.2)

## 2015-03-27 LAB — T4, FREE: FREE T4: 0.84 ng/dL (ref 0.60–1.60)

## 2015-03-27 LAB — TSH: TSH: 0.23 u[IU]/mL — ABNORMAL LOW (ref 0.35–4.50)

## 2015-03-27 MED ORDER — VITAMIN D (ERGOCALCIFEROL) 1.25 MG (50000 UNIT) PO CAPS: 50000.0000 [IU] | ORAL_CAPSULE | ORAL | Status: DC

## 2015-03-27 NOTE — Patient Instructions (Signed)
Please stop at the lab.  If the vitamin D is normal, please start a 24h urine collection.  Patient information (Up-to-Date): Collection of a 24-hour urine specimen  - You should collect every drop of urine during each 24-hour period. It does not matter how much or little urine is passed each time, as long as every drop is collected. - Begin the urine collection in the morning after you wake up, after you have emptied your bladder for the first time. - Urinate (empty the bladder) for the first time and flush it down the toilet. Note the exact time (eg, 6:15 AM). You will begin the urine collection at this time. - Collect every drop of urine during the day and night in an empty collection bottle. Store the bottle at room temperature or in the refrigerator. - If you need to have a bowel movement, any urine passed with the bowel movement should be collected. Try not to include feces with the urine collection. If feces does get mixed in, do not try to remove the feces from the urine collection bottle. - Finish by collecting the first urine passed the next morning, adding it to the collection bottle. This should be within ten minutes before or after the time of the first morning void on the first day (which was flushed). In this example, you would try to void between 6:05 and 6:25 on the second day. - If you need to urinate one hour before the final collection time, drink a full glass of water so that you can void again at the appropriate time. If you have to urinate 20 minutes before, try to hold the urine until the proper time. - Please note the exact time of the final collection, even if it is not the same time as when collection began on day 1. - The bottle(s) may be kept at room temperature for a day or two, but should be kept cool or refrigerated for longer periods of time.  Please return in 3 months.

## 2015-03-27 NOTE — Progress Notes (Addendum)
Patient ID: Kylie Baker, female   DOB: 08-Aug-1976, 39 y.o.   MRN: [id number]   HPI  Kylie Baker is a 39 y.o.-year-old female, initially referred by her PCP, [name], PA-C (Dr. Lerry Liner), for evaluation for hypercalcemia/hyperparathyroidism and MNG,  low TSH. Last visit 1 year ago. PCP: [name] PA-C at [facility]   Hypercalcemia: Pt was dx with hypercalcemia in 2013. I reviewed pt's pertinent labs:  Lab Results  Component Value Date   PTH 61 02/09/2014   PTH Comment 02/09/2014   CALCIUM 10.9* 02/09/2014   CALCIUM 11.2* 09/07/2012   CALCIUM 10.6* 08/08/2012   CALCIUM 11.6* 04/21/2012   CALCIUM 11.6* 03/26/2012   CALCIUM 10.7* 02/29/2012   CALCIUM 11.3* 01/25/2012   CALCIUM 10.3 06/06/2010   CALCIUM 10.2 06/03/2010   CALCIUM 10.0 05/26/2010   Previous labs from PCP: 11/23/2013: - Calcium 10.9 (8.4-10.5) - TSH 0.311 (0.350-4.5) - Hba1c 9.9% 01/18/2014: - Calcium 10.9 - PTH 132  - TSH 0.224 No previous DEXA scans. No fractures or falls.  No h/o kidney stones.  We stopped HCTZ in 01/2015.  Vit D deficiency A vit D level drawn at last visit, returned low (16) >> I advised her through MyChart to start vit D 2000 IU daily and to come back to recheck at last visit. She did not read the message so she did not start the vit D supplementation. We started it at last visit >> she did not return for a year >> tells me she is actually is taking 5000 IU daily.   Low TSH: Last TFTS: Lab Results  Component Value Date   TSH 0.01* 04/13/2014   TSH 0.03* 02/09/2014   FREET4 0.84 04/13/2014   FREET4 1.00 02/09/2014  TSI 30 (<140%).  A thyroid Uptake and scan (06/06/2014) showed:  The 24 hr radioactive iodine uptake is equal to 26.6%.  On the thyroid scan the gland appears enlarged. There is heterogeneous radiotracer uptake throughout both lobes of the enlarged thyroid gland with a more focal area of increased uptake localizing to the  inferior pole of the right lobe. No dominant cold nodule identified.  IMPRESSION: 1. The 24 hr radioactive iodine uptake is at the upper limits of normal at 27%. 2. 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  She started on MMI 5 mg daily but stopped last year.  She c/o: - no chest pain - + occasional palpitations - no hot flushes - no tremors - + weight gain and loss  Exercises by walking 3x a week.  No nodules in neck; no dysphagia/no odynophagia, no hoarseness, no SOB.  ROS: Constitutional: + weight loss/gain, no fatigue, no subjective hyperthermia/hypothermia, + poor sleep Eyes: no blurry vision, no xerophthalmia ENT: no sore throat, no nodules palpated in throat, no dysphagia/odynophagia, no hoarseness Cardiovascular: no CP/no SOB/palpitations/leg swelling Respiratory: no cough/SOB Gastrointestinal: no N/V/no D/C Musculoskeletal: no muscle/joint aches Skin: no rashes Neurological: no tremors/numbness/tingling/dizziness  I reviewed pt's medications, allergies, PMH, social hx, family hx, and changes were documented in the history of present illness. Otherwise, unchanged from my initial visit note. Stopped Furosemide.  PE: BP 128/64 mmHg  Pulse 93  Temp(Src) 98.5 F (36.9 C) (Oral)  Resp 14  Wt 238 lb (107.956 kg)  SpO2 97% Body mass index is 40.24 kg/(m^2). Wt Readings from Last 3 Encounters:  03/27/15 238 lb (107.956 kg)  04/13/14 227 lb (102.967 kg)  02/09/14 236 lb (107.049 kg)   Constitutional: overweight, in NAD. No kyphosis. Eyes: PERRLA, EOMI, no  exophthalmos ENT: moist mucous membranes, + thyromegaly, no cervical lymphadenopathy Cardiovascular: RRR, No MRG Respiratory: CTA B Gastrointestinal: abdomen soft, NT, ND, BS+ Musculoskeletal: no deformities, strength intact in all 4 Skin: moist, warm, no rashes, + hirsutism chest and chin. Neurological: no tremor with outstretched hands, DTR normal in all 4  Assessment: 1.  Hypercalcemia/hyperparathyroidism  2. Vit D deficiency  3. Subclinical hyperthyroidism  Plan: 1. Hypercalcemia/hyperparathyroidism and 2. Vit D deficiency Patient has had repeated instances of elevated calcium, with the highest level being at 11.6. An intact PTH level was also high, at 132, for a calcium of 10.9 on 01/18/2014. A repeated PTH last year returned at 75,  For calcium of 10.8. She was on HCTZ at our first visit, now off. At last check,  Her vitamin D was low at 16 >>  We started vitamin D supplementation  (I suggested 2000 units daily, but she actually started 5000 units daily, which she continues today).  I again discussed with her that we need to replete this before rechecking calcium and PTH  Again. No apparent complications from hypercalcemia: no h/o nephrolithiasis, no osteoporosis, no fractures. No abdominal pain, depression, bone pain. - she meets 2 criteria for parathyroid surgery:  Increased calcium by more than 1 mg/dL above the upper limit of normal  Age <21 years old Kidney ds.  Osteoporosis  - we will first need to make sure that her elevated PTH level is a primary, not a secondary problem. Now that she is off HCTZ, we can recheck the PTH and calcium, but will do this after we get her vit D WNL -  If her vitamin D is normal, we'll need to  obtain a 24-hour urine collection for calcium.  Patient was given instructions about how to do this. - If the tests indicate a parathyroid adenoma, I will refer her to surgery. I will wait for the results of the above labs and will discuss with the plan with the patient.  Return in about 3 months (around 06/27/2015).  3. [place] hyperthyroidism - she had low TSH per records from  2015.  We performed a thyroid uptake and scan last year and the scan was consistent with multinodular goiter, with an uptake at the upper limit of normal. She was started on a low dose MMI (5 mg daily), subsequently stopped. She was lost for follow-up for a year.   She established care with another PCP since last year , but no TSH was recently checked. - will recheck TSH today, along with free t4 and free T3  -  We may need a thyroid ultrasound in the future. She has a very large goiter.  Patient Instructions  Please stop at the lab.  If the vitamin D is normal, please start a 24h urine collection.  Patient information (Up-to-Date): Collection of a 24-hour urine specimen  - You should collect every drop of urine during each 24-hour period. It does not matter how much or little urine is passed each time, as long as every drop is collected. - Begin the urine collection in the morning after you wake up, after you have emptied your bladder for the first time. - Urinate (empty the bladder) for the first time and flush it down the toilet. Note the exact time (eg, 6:15 AM). You will begin the urine collection at this time. - Collect every drop of urine during the day and night in an empty collection bottle. Store the bottle at room temperature or in the  refrigerator. - If you need to have a bowel movement, any urine passed with the bowel movement should be collected. Try not to include feces with the urine collection. If feces does get mixed in, do not try to remove the feces from the urine collection bottle. - Finish by collecting the first urine passed the next morning, adding it to the collection bottle. This should be within ten minutes before or after the time of the first morning void on the first day (which was flushed). In this example, you would try to void between 6:05 and 6:25 on the second day. - If you need to urinate one hour before the final collection time, drink a full glass of water so that you can void again at the appropriate time. If you have to urinate 20 minutes before, try to hold the urine until the proper time. - Please note the exact time of the final collection, even if it is not the same time as when collection began on day 1. - The  bottle(s) may be kept at room temperature for a day or two, but should be kept cool or refrigerated for longer periods of time.  Please return in 3 months.  - time spent with the patient:  40 minutes, of which >50% was spent in obtaining information about her symptoms, reviewing her previous labs, evaluations, and treatments, counseling her about her conditions (please see the discussed topics above), and developing a plan to further investigate them.  Office Visit on 03/27/2015  Component Date Value Ref Range Status  . VITD 03/27/2015 12.22* 30.00 - 100.00 ng/mL Final  . TSH 03/27/2015 0.23* 0.35 - 4.50 uIU/mL Final  . Free T4 03/27/2015 0.84  0.60 - 1.60 ng/dL Final  . T3, Free 65/78/469606/04/2015 3.7  2.3 - 4.2 pg/mL Final   TSH slightly low, improved. No intervention needed for now. Will continue to monitor. If TSH very suppressed >> will need RAI tx. Vit D lower than before despite 5000 IU daily of vit D >> start Ergocalciferol 50,000 units weekly >> recheck vit D in 2 months >> will need to move appt out at 4 months.  Component     Latest Ref Rng 04/02/2015  Creatinine, Urine      110.2  Creatinine, 24H Ur     700 - 1800 mg/day 1543  Calcium, Ur      26  Calcium, 24 hour urine     100 - 250 mg/day 364 (H)   Urinary calcium is elevated. She performed the urine collection sooner than advised, as I would have preferred to have it done in the setting of a normal vitamin D. 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 She will need a referral to surgery. However, performing the surgery in the setting of very low vitamin D can be conducive to hypocalcemia postop, so preferably we would normalize the vitamin D before surgical referral. I will recheck her vitamin D in August and refer her to surgery afterwards.

## 2015-03-29 ENCOUNTER — Encounter: Payer: Self-pay | Admitting: Internal Medicine

## 2015-04-02 ENCOUNTER — Other Ambulatory Visit: Payer: Self-pay

## 2015-04-02 ENCOUNTER — Encounter: Payer: Self-pay | Admitting: *Deleted

## 2015-04-03 LAB — CREATININE, URINE, 24 HOUR
CREATININE, URINE: 110.2 mg/dL
Creatinine, 24H Ur: 1543 mg/d (ref 700–1800)

## 2015-04-03 LAB — CALCIUM, URINE, 24 HOUR
CALCIUM 24HR UR: 364 mg/d — AB (ref 100–250)
CALCIUM UR: 26 mg/dL

## 2015-04-24 NOTE — Addendum Note (Signed)
Addended by: Carlus PavlovGHERGHE, Chrisoula Zegarra on: 04/24/2015 01:13 PM   Modules accepted: Level of Service

## 2015-07-04 ENCOUNTER — Ambulatory Visit (INDEPENDENT_AMBULATORY_CARE_PROVIDER_SITE_OTHER): Payer: Self-pay | Admitting: Internal Medicine

## 2015-07-04 ENCOUNTER — Encounter: Payer: Self-pay | Admitting: Internal Medicine

## 2015-07-04 VITALS — BP 118/72 | HR 94 | Temp 99.0°F | Resp 12 | Wt 238.8 lb

## 2015-07-04 DIAGNOSIS — E0590 Thyrotoxicosis, unspecified without thyrotoxic crisis or storm: Secondary | ICD-10-CM

## 2015-07-04 DIAGNOSIS — E210 Primary hyperparathyroidism: Secondary | ICD-10-CM

## 2015-07-04 DIAGNOSIS — E559 Vitamin D deficiency, unspecified: Secondary | ICD-10-CM

## 2015-07-04 DIAGNOSIS — E042 Nontoxic multinodular goiter: Secondary | ICD-10-CM

## 2015-07-04 DIAGNOSIS — E049 Nontoxic goiter, unspecified: Secondary | ICD-10-CM | POA: Insufficient documentation

## 2015-07-04 LAB — T4, FREE: FREE T4: 0.97 ng/dL (ref 0.60–1.60)

## 2015-07-04 LAB — TSH: TSH: 0.27 u[IU]/mL — AB (ref 0.35–4.50)

## 2015-07-04 LAB — VITAMIN D 25 HYDROXY (VIT D DEFICIENCY, FRACTURES): VITD: 32.96 ng/mL (ref 30.00–100.00)

## 2015-07-04 LAB — T3, FREE: T3, Free: 3.4 pg/mL (ref 2.3–4.2)

## 2015-07-04 NOTE — Progress Notes (Signed)
Patient ID: Kylie Baker, female   DOB: 11-12-1975, 39 y.o.   MRN: [id number]   HPI  Kylie Baker is a 39 y.o.-year-old female, initially referred by her PCP, [name], PA-C (Dr. Lerry Liner), for evaluation for hypercalcemia/hyperparathyroidism and MNG,  low TSH. Last visit 3 mo ago. PCP: [name] PA-C at [facility]  Hypercalcemia: Pt was dx with hypercalcemia in 2013. I reviewed pt's pertinent labs:  Lab Results  Component Value Date   PTH 61 02/09/2014   PTH Comment 02/09/2014   CALCIUM 10.9* 02/09/2014   CALCIUM 11.2* 09/07/2012   CALCIUM 10.6* 08/08/2012   CALCIUM 11.6* 04/21/2012   CALCIUM 11.6* 03/26/2012   CALCIUM 10.7* 02/29/2012   CALCIUM 11.3* 01/25/2012   CALCIUM 10.3 06/06/2010   CALCIUM 10.2 06/03/2010   CALCIUM 10.0 05/26/2010   Previous labs from PCP: 11/23/2013: - Calcium 10.9 (8.4-10.5) - TSH 0.311 (0.350-4.5) - Hba1c 9.9% 01/18/2014: - Calcium 10.9 - PTH 132  - TSH 0.224 No previous DEXA scans. No fractures or falls.  No h/o kidney stones.  We stopped HCTZ in 01/2015.  Subsequence urine calcium level was elevated: Component     Latest Ref Rng 04/02/2015  Creatinine, Urine      110.2  Creatinine, 24H Ur     700 - 1800 mg/day 1543  Calcium, Ur      26  Calcium, 24 hour urine     100 - 250 mg/day 364 (H)   Vit D deficiency Patient has a history of a low vitamin D (16) >> started vit D 5000 IU daily, which, however, was not enough, so that her last vitamin D level was even lower: Component     Latest Ref Rng 02/09/2014 03/27/2015  Vit D, 25-Hydroxy     30 - 89 ng/mL 16 (L)   VITD     30.00 - 100.00 ng/mL  12.22 (L)  At that point, I advised her to start ergocalciferol return for repeat vitamin D level. She did not return for labs, but is here for a visit today. She tells me she took the Ergocalciferol EVERY DAY x 1 week (!!!!!), then continues with 40981 units daily vitamin D3 OTC.  Low TSH: Last TFTS: Lab  Results  Component Value Date   TSH 0.23* 03/27/2015   TSH 0.01* 04/13/2014   TSH 0.03* 02/09/2014   FREET4 0.84 03/27/2015   FREET4 0.84 04/13/2014   FREET4 1.00 02/09/2014  TSI 30 (<140%).  A thyroid Uptake and scan (06/06/2014) showed:  The 24 hr radioactive iodine uptake is equal to 26.6%.  On the thyroid scan the gland appears enlarged. There is heterogeneous radiotracer uptake throughout both lobes of the enlarged thyroid gland with a more focal area of increased uptake localizing to the inferior pole of the right lobe. No dominant cold nodule identified.  IMPRESSION: 1. The 24 hr radioactive iodine uptake is at the upper limits of normal at 27%. 2. 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  She started on MMI 5 mg daily but stopped last year.  She c/o: - no chest pain - + nausea, + diarrhea >> Metformin - + occasional palpitations - + hot flushes - no tremors - + Subjective weight gain  No nodules felt in neck; no dysphagia/no odynophagia, no hoarseness, no SOB.  ROS: Constitutional: + weight gain, no fatigue, no subjective hyperthermia/hypothermia Eyes: no blurry vision, no xerophthalmia ENT: no sore throat, no nodules palpated in throat, no dysphagia/odynophagia, no hoarseness Cardiovascular: no CP/no SOB/palpitations/leg swelling  Respiratory: no cough/SOB Gastrointestinal: + N/no V/+ D/no C >> I advised her to take metformin with meals, rather than before meals, and she is taking it now  Musculoskeletal: no muscle/joint aches Skin: no rashes Neurological: no tremors/numbness/tingling/dizziness  I reviewed pt's medications, allergies, PMH, social hx, family hx, and changes were documented in the history of present illness. Otherwise, unchanged from my initial visit note. She started [place].  PE: BP 118/72 mmHg  Pulse 94  Temp(Src) 99 F (37.2 C) (Oral)  Resp 12  Wt 238 lb 12.8 oz (108.319 kg)  SpO2 98% Body mass index is 40.37 kg/(m^2). Wt Readings  from Last 3 Encounters:  07/04/15 238 lb 12.8 oz (108.319 kg)  03/27/15 238 lb (107.956 kg)  04/13/14 227 lb (102.967 kg)   Constitutional: overweight, in NAD. No kyphosis. Eyes: PERRLA, EOMI, no exophthalmos ENT: moist mucous membranes, + large, symmetric thyromegaly, no cervical lymphadenopathy Cardiovascular: RRR, No MRG Respiratory: CTA B Gastrointestinal: abdomen soft, NT, ND, BS+ Musculoskeletal: no deformities, strength intact in all 4 Skin: moist, warm, no rashes, + hirsutism chest and chin. Neurological: no tremor with outstretched hands, DTR normal in all 4  Assessment: 1. Hypercalcemia/hyperparathyroidism  2. Vit D deficiency  3. Subclinical hyperthyroidism  4. Large goiter  Plan: 1. Hypercalcemia/hyperparathyroidism and 2. Vit D deficiency Patient has had repeated instances of elevated calcium, with the highest level being at 11.6. An intact PTH level was also high, at 132, for a calcium of 10.9 on 01/18/2014.  She was on HCTZ in the past, now off. A 24-hour urinary calcium was elevated, At 364 at last visit, in 03/2015. - Her vitamin D was low at 16 >>  We started vitamin D supplementation 5000 units daily >> vit D 12.At that point, I suggested that she started ergocalciferol 50,000 units weekly. She did not renew instructions and took it daily for 8 days. After this, she started 15,000 units daily, which she continues today.  I again discussed with her that we need to get her vitamin D is close to the normal range as possible before surgery for primary hyperparathyroidism.  - No apparent complications from hypercalcemia: no h/o nephrolithiasis, no osteoporosis, no fractures. No abdominal pain, depression, bone pain. - she meets 2 criteria for parathyroid surgery:  Increased calcium by more than 1 mg/dL above the upper limit of normal  Age <67 years old Kidney ds.  Osteoporosis  - I will check the following labs today: . PTH, Intact and Calcium  . Vitamin D (25  hydroxy)  - If the vitamin D is normal or close to normal, I will refer her to surgery.  Return in about 4 months (around 11/03/2015).  3. And 4.  hyperthyroidism and goiter - she had low TSH per records from  2015.  We performed a thyroid uptake and scan in 2015  and the scan was consistent with multinodular goiter, with an uptake at the upper limit of normal. She was started on a low dose MMI (5 mg daily), subsequently stopped. She was lost for follow-up for a year. She continues to stay off methimazole now, but the latest TSH, from 03/2015, was close to the normal range. - will recheck TSH today, along with free t4 and free T3  -  We may need a thyroid ultrasound in the future. She has a very large goiter. There were no cold nodules on the reviewed uptake and scan that she had on 06/06/2014.   Component     Latest Ref  Rng 07/04/2015 07/04/2015         8:33 AM  8:33 AM  Calcium     8.7 - 10.2 mg/dL  40.9 (H)  PTH     15 - 65 pg/mL  79 (H)  TSH     0.35 - 4.50 uIU/mL 0.27 (L)   Free T4     0.60 - 1.60 ng/dL 8.11   T3, Free     2.3 - 4.2 pg/mL 3.4   VITD     30.00 - 100.00 ng/mL 32.96    PTH and calcium remains high despite normalization of vitamin D. I suspect she has primary hyperparathyroidism. I will refer her to surgery (Dr Gerrit Friends). We'll continue 15,000 units vitamin D3 daily. TSH continues to improve. Free T4 and free T3 are normal.

## 2015-07-04 NOTE — Patient Instructions (Signed)
Please stop at the lab.  I will let you know about the results through MyChart.   Continue vitamin D 53664 units daily for now.  Please come back for a follow-up appointment in 4 months.

## 2015-07-05 LAB — PTH, INTACT AND CALCIUM
CALCIUM: 10.4 mg/dL — AB (ref 8.7–10.2)
PTH: 79 pg/mL — AB (ref 15–65)

## 2015-10-15 ENCOUNTER — Inpatient Hospital Stay (HOSPITAL_COMMUNITY)
Admission: EM | Admit: 2015-10-15 | Discharge: 2015-10-15 | DRG: 313 | Disposition: A | Discharge status: Home / Self Care | Payer: Self-pay | Attending: Internal Medicine | Admitting: Internal Medicine

## 2015-10-15 ENCOUNTER — Encounter (HOSPITAL_COMMUNITY): Payer: Self-pay | Admitting: *Deleted

## 2015-10-15 ENCOUNTER — Emergency Department (HOSPITAL_COMMUNITY): Payer: Self-pay

## 2015-10-15 DIAGNOSIS — E119 Type 2 diabetes mellitus without complications: Secondary | ICD-10-CM | POA: Diagnosis present

## 2015-10-15 DIAGNOSIS — R079 Chest pain, unspecified: Principal | ICD-10-CM | POA: Diagnosis present

## 2015-10-15 DIAGNOSIS — I10 Essential (primary) hypertension: Secondary | ICD-10-CM | POA: Diagnosis present

## 2015-10-15 DIAGNOSIS — Z7984 Long term (current) use of oral hypoglycemic drugs: Secondary | ICD-10-CM

## 2015-10-15 DIAGNOSIS — R531 Weakness: Secondary | ICD-10-CM

## 2015-10-15 DIAGNOSIS — Z79899 Other long term (current) drug therapy: Secondary | ICD-10-CM

## 2015-10-15 DIAGNOSIS — J45909 Unspecified asthma, uncomplicated: Secondary | ICD-10-CM | POA: Diagnosis present

## 2015-10-15 LAB — CBC
HCT: 36.3 % (ref 36.0–46.0)
Hemoglobin: 11 g/dL — ABNORMAL LOW (ref 12.0–15.0)
MCH: 20.8 pg — AB (ref 26.0–34.0)
MCHC: 30.3 g/dL (ref 30.0–36.0)
MCV: 68.5 fL — ABNORMAL LOW (ref 78.0–100.0)
PLATELETS: 148 10*3/uL — AB (ref 150–400)
RBC: 5.3 MIL/uL — ABNORMAL HIGH (ref 3.87–5.11)
RDW: 17.2 % — ABNORMAL HIGH (ref 11.5–15.5)
WBC: 5.8 10*3/uL (ref 4.0–10.5)

## 2015-10-15 LAB — BASIC METABOLIC PANEL
Anion gap: 8 (ref 5–15)
BUN: 5 mg/dL — ABNORMAL LOW (ref 6–20)
CALCIUM: 10.5 mg/dL — AB (ref 8.9–10.3)
CO2: 23 mmol/L (ref 22–32)
CREATININE: 0.49 mg/dL (ref 0.44–1.00)
Chloride: 106 mmol/L (ref 101–111)
GFR calc Af Amer: >60 mL/min (ref >60)
GFR calc non Af Amer: >60 mL/min (ref >60)
GLUCOSE: 233 mg/dL — AB (ref 65–99)
Potassium: 3.9 mmol/L (ref 3.5–5.1)
Sodium: 137 mmol/L (ref 135–145)

## 2015-10-15 LAB — I-STAT TROPONIN, ED
TROPONIN I, POC: 0 ng/mL (ref 0.00–0.08)
Troponin i, poc: 0 ng/mL (ref 0.00–0.08)

## 2015-10-15 MED ORDER — GI COCKTAIL ~~LOC~~
30.0000 mL | Freq: Once | ORAL | Status: AC
  Administered 2015-10-15: 30 mL via ORAL
  Filled 2015-10-15: qty 30

## 2015-10-15 MED ORDER — NAPROXEN 375 MG PO TABS: 375.0000 mg | ORAL_TABLET | Freq: Two times a day (BID) | ORAL | Status: DC

## 2015-10-15 NOTE — ED Notes (Signed)
Pt reports intermittent chest discomfort for a couple of weeks, today pain became acutely worse - pt also c/o shortness of breath and dizziness.

## 2015-10-15 NOTE — ED Provider Notes (Signed)
CSN: [id number]     Arrival date & time 10/15/15  1313 History   First MD Initiated Contact with Patient 10/15/15 1722     Chief Complaint  Patient presents with  . Chest Pain  . Shortness of Breath     (Consider location/radiation/quality/duration/timing/severity/associated sxs/prior Treatment) Patient is a 39 y.o. female presenting with chest pain. The history is provided by the patient.  Chest Pain Pain location:  Substernal area Pain quality comment:  Squeezing Pain radiates to:  Does not radiate Pain radiates to the back: no   Pain severity:  Severe (10) Duration: last 3-5 minutes at a time. Timing:  Intermittent Progression:  Worsening Chronicity:  Recurrent (Same CP symptoms since 2013) Context: at rest   Context: not breathing, not eating and no movement   Relieved by:  Nothing Associated symptoms: palpitations and shortness of breath   Associated symptoms: no abdominal pain, no back pain, no cough, no diaphoresis, no fatigue, no fever, no headache, no heartburn, no lower extremity edema, no nausea, no near-syncope, no numbness, no orthopnea, no PND, no syncope, not vomiting and no weakness   Risk factors: diabetes mellitus and hypertension   Risk factors: no smoking    Pt is a 39 year-old female with PMHx of HTN, DM, asthma, presents to the ER with complaint of intermittent CP for several weeks which became more severe today with central, substernal squeezing, 10/10 pain without radiation, beginning at rest, lasting 3-5 minutes, with associated SOB and palpitations.  There are no identified aggrivating or provoking factors.  Also no alleviating factors, the pain comes and goes.  The pain was much worse today than normal, she has experienced CP since before a surgery in 2013.  The pt is currently not experiencing CP or SOB, and palpitations come and go.  She denies LE swelling, lightheadedness, cough, wheeze, abdominal pain, fever, chills, sweats, N, V.  Pt states her  amlodipine was recently increased and her CP improved for a short time, but over the past several week it again worsened.   Past Medical History  Diagnosis Date  . Pregnancy induced hypertension   . Asthma   . Gestational diabetes 01/25/2012   Past Surgical History  Procedure Laterality Date  . Cesarean section  06/06/2010  . Appendectomy  1980's  . Cesarean section  03/27/2012    Procedure: CESAREAN SECTION;  Surgeon: Kathreen Cosier, MD;  Location: [facility];  Service: Gynecology;  Laterality: N/A;  Repeat Cesarean Section Delivery   Boy  @ 0005 , Apgars 8/9  . Cholecystectomy  04/22/2012    Procedure: LAPAROSCOPIC CHOLECYSTECTOMY WITH INTRAOPERATIVE CHOLANGIOGRAM;  Surgeon: Mariella Saa, MD;  Location: [facility];  Service: General;  Laterality: N/A;  . Umbilical hernia repair  09/07/2012    Procedure: HERNIA REPAIR UMBILICAL ADULT;  Surgeon: Adolph Pollack, MD;  Location: [facility];  Service: General;  Laterality: N/A;  Repair of Incisional Incarcarated hernia   Family History  Problem Relation Age of Onset  . Anesthesia problems Neg Hx   . Hypertension Mother   . Diabetes Father   . Asthma Father    Social History  Substance Use Topics  . Smoking status: Never Smoker   . Smokeless tobacco: Never Used  . Alcohol Use: No   OB History    Gravida Para Term Preterm AB TAB SAB Ectopic Multiple Living   0 1 0 0 2     Review of Systems  Constitutional: Negative for  fever, diaphoresis and fatigue.  Respiratory: Positive for shortness of breath. Negative for cough.   Cardiovascular: Positive for chest pain and palpitations. Negative for orthopnea, syncope, PND and near-syncope.  Gastrointestinal: Negative for heartburn, nausea, vomiting and abdominal pain.  Musculoskeletal: Negative for back pain.  Neurological: Negative for weakness, numbness and headaches.      Allergies  Aspirin  Home Medications   Prior to Admission medications   Medication Sig Start Date  End Date Taking? Authorizing Provider  amLODipine (NORVASC) 10 MG tablet Take 10 mg by mouth daily.    Yes Historical Provider, MD  Cholecalciferol (VITAMIN D3) 5000 UNITS CAPS Take 3 capsules by mouth daily.   Yes Historical Provider, MD  dapagliflozin propanediol (FARXIGA) 5 MG TABS tablet Take 5 mg by mouth daily.   Yes Historical Provider, MD  lisinopril (PRINIVIL,ZESTRIL) 20 MG tablet TAKE 1 TABLET BY MOUTH EVERY DAY 09/22/14  Yes Carlus Pavlov, MD  lovastatin (MEVACOR) 20 MG tablet Take 20 mg by mouth at bedtime.   Yes Historical Provider, MD  metFORMIN (GLUCOPHAGE) 850 MG tablet Take 850 mg by mouth 2 (two) times daily with a meal.   Yes Historical Provider, MD  naproxen (NAPROSYN) 375 MG tablet Take 1 tablet (375 mg total) by mouth 2 (two) times daily with a meal. 10/15/15   Danelle Berry, PA-C   BP 142/99 mmHg  Pulse 89  Temp(Src) 98.5 F (36.9 C) (Oral)  Resp 16  SpO2 99%  LMP 10/07/2015 Physical Exam  Constitutional: She is oriented to person, place, and time. She appears well-developed and well-nourished. No distress.  Non-toxic appearing, NAD  HENT:  Head: Normocephalic and atraumatic.  Nose: Nose normal.  Mouth/Throat: Oropharynx is clear and moist.  Eyes: Conjunctivae are normal. Pupils are equal, round, and reactive to light. Right eye exhibits no discharge. Left eye exhibits no discharge. No scleral icterus.  Neck: Normal range of motion. No JVD present. No tracheal deviation present. No thyromegaly present.  Cardiovascular: Normal rate, regular rhythm, normal heart sounds and intact distal pulses.  Exam reveals no gallop and no friction rub.   No murmur heard. Pulmonary/Chest: Effort normal and breath sounds normal. No respiratory distress. She has no wheezes. She has no rales. She exhibits no tenderness.  Abdominal: Soft. Bowel sounds are normal. She exhibits no distension. There is no tenderness.  Musculoskeletal: Normal range of motion. She exhibits no edema or  tenderness.  Lymphadenopathy:    She has no cervical adenopathy.  Neurological: She is alert and oriented to person, place, and time. No cranial nerve deficit. She exhibits normal muscle tone. Coordination normal.  Skin: Skin is warm and dry. No rash noted. She is not diaphoretic. No erythema. No pallor.  Psychiatric: She has a normal mood and affect. Her behavior is normal. Judgment and thought content normal.  Nursing note and vitals reviewed.   ED Course  Procedures (including critical care time) Labs Review Labs Reviewed  BASIC METABOLIC PANEL - Abnormal; Notable for the following:    Glucose, Bld 233 (*)    BUN 5 (*)    Calcium 10.5 (*)    All other components within normal limits  CBC - Abnormal; Notable for the following:    RBC 5.30 (*)    Hemoglobin 11.0 (*)    MCV 68.5 (*)    MCH 20.8 (*)    RDW 17.2 (*)    Platelets 148 (*)    All other components within normal limits  I-STAT TROPOININ, ED  I-STAT  TROPOININ, ED    Imaging Review DG Chest 2 View (Final result) Result time: 10/15/15 15:01:10   Final result by Rad Results In Interface (10/15/15 15:01:10)   Narrative:   CLINICAL DATA: Intermittent chest pain  EXAM: CHEST 2 VIEW  COMPARISON: 09/07/2012  FINDINGS: Cardiomediastinal silhouette is stable. No acute infiltrate or pleural effusion. No pulmonary edema. Bony thorax is unremarkable.  IMPRESSION: No active cardiopulmonary disease.   Electronically Signed By: Natasha MeadLiviu Pop M.D. On: 10/15/2015 15:01         No results found. I have personally reviewed and evaluated these images and lab results as part of my medical decision-making.   EKG Interpretation   Date/Time:  Monday October 15 2015 13:17:16 EST Ventricular Rate:  92 PR Interval:  144 QRS Duration: 76 QT Interval:  338 QTC Calculation: 417 R Axis:   10 Text Interpretation:  Normal sinus rhythm Nonspecific T wave abnormality  Abnormal ECG No acute changes Confirmed by  Rhunette CroftNANAVATI, MD, Janey GentaANKIT [phone]([id number]) on  10/15/2015 5:47:00 PM      MDM  Pt with intermittent CP, more severe today, similar to CP she has experienced since 2013 CP work up initiated  Initial troponin was negative, EKG was NSR with nonspecific T-wave abnormality, no ST elevation CXR negative for acute pulmonary pathology.  Cell counts and chemisty results were consistent with the pt's baseline.  The pt stated she needed to leave because her babysitter had called and the pt needed to go home.  She agreed to stay for a repeat troponin, which was negative.    Since the pt has experienced similar CP for several weeks to years, suspect there is no emergent medication condition today, however, given her risk factors, DM, HTN, obesity, pt has a heart score of 3, and may need further cardiac work up.  She was seen and evaluated by Dr. Rhunette CroftNanavati, who has suggested the pt be prescribed NSAIDS, and be given referral to cardiology.  The pt d/c home in good condition with VSS  Filed Vitals:   10/15/15 1700 10/15/15 1730 10/15/15 1815 10/15/15 1859  BP: 130/86 134/98 140/74 142/99  Pulse: 84 92 91 89  Temp:      TempSrc:      Resp: 13 18 23 16   SpO2: 99% 100% 98% 99%    Final diagnoses:  Right sided weakness  Chest pain, unspecified chest pain type       Danelle BerryLeisa Rashidah [place], PA-C 10/18/15 0011  Derwood KaplanAnkit Nanavati, MD 10/18/15 1600

## 2015-10-15 NOTE — Discharge Instructions (Signed)
You have been seen and evaluated for chest pain. Please take naproxen as prescribed as it will treat any pain and inflammation in your chest muscles and cartilage.  Please follow up with cardiology, by calling their office for an appointment, as he may need further workup, such as stress testing.  Chest Wall Pain Chest wall pain is pain in or around the bones and muscles of your chest. Sometimes, an injury causes this pain. Sometimes, the cause may not be known. This pain may take several weeks or longer to get better. HOME CARE Pay attention to any changes in your symptoms. Take these actions to help with your pain:  Rest as told by your doctor.  Avoid activities that cause pain. Try not to use your chest, belly (abdominal), or side muscles to lift heavy things.  If directed, apply ice to the painful area:  Put ice in a plastic bag.  Place a towel between your skin and the bag.  Leave the ice on for 20 minutes, 2-3 times per day.  Take over-the-counter and prescription medicines only as told by your doctor.  Do not use tobacco products, including cigarettes, chewing tobacco, and e-cigarettes. If you need help quitting, ask your doctor.  Keep all follow-up visits as told by your doctor. This is important. GET HELP IF:  You have a fever.  Your chest pain gets worse.  You have new symptoms. GET HELP RIGHT AWAY IF:  You feel sick to your stomach (nauseous) or you throw up (vomit).  You feel sweaty or light-headed.  You have a cough with phlegm (sputum) or you cough up blood.  You are short of breath.   This information is not intended to replace advice given to you by your health care provider. Make sure you discuss any questions you have with your health care provider.   Document Released: 03/24/2008 Document Revised: 06/27/2015 Document Reviewed: 01/01/2015 Elsevier Interactive Patient Education 2016 [employer]  Nonspecific Chest Pain  Chest pain can be caused by  many different conditions. There is always a chance that your pain could be related to something serious, such as a heart attack or a blood clot in your lungs. Chest pain can also be caused by conditions that are not life-threatening. If you have chest pain, it is very important to follow up with your health care provider. CAUSES  Chest pain can be caused by:  Heartburn.  Pneumonia or bronchitis.  Anxiety or stress.  Inflammation around your heart (pericarditis) or lung (pleuritis or pleurisy).  A blood clot in your lung.  A collapsed lung (pneumothorax). It can develop suddenly on its own (spontaneous pneumothorax) or from trauma to the chest.  Shingles infection (varicella-zoster virus).  Heart attack.  Damage to the bones, muscles, and cartilage that make up your chest wall. This can include:  Bruised bones due to injury.  Strained muscles or cartilage due to frequent or repeated coughing or overwork.  Fracture to one or more ribs.  Sore cartilage due to inflammation (costochondritis). RISK FACTORS  Risk factors for chest pain may include:  Activities that increase your risk for trauma or injury to your chest.  Respiratory infections or conditions that cause frequent coughing.  Medical conditions or overeating that can cause heartburn.  Heart disease or family history of heart disease.  Conditions or health behaviors that increase your risk of developing a blood clot.  Having had chicken pox (varicella zoster). SIGNS AND SYMPTOMS Chest pain can feel like:  Burning or  tingling on the surface of your chest or deep in your chest.  Crushing, pressure, aching, or squeezing pain.  Dull or sharp pain that is worse when you move, cough, or take a deep breath.  Pain that is also felt in your back, neck, shoulder, or arm, or pain that spreads to any of these areas. Your chest pain may come and go, or it may stay constant. DIAGNOSIS Lab tests or other studies may be  needed to find the cause of your pain. Your health care provider may have you take a test called an ambulatory ECG (electrocardiogram). An ECG records your heartbeat patterns at the time the test is performed. You may also have other tests, such as:  Transthoracic echocardiogram (TTE). During echocardiography, sound waves are used to create a picture of all of the heart structures and to look at how blood flows through your heart.  Transesophageal echocardiogram (TEE).This is a more advanced imaging test that obtains images from inside your body. It allows your health care provider to see your heart in finer detail.  Cardiac monitoring. This allows your health care provider to monitor your heart rate and rhythm in real time.  Holter monitor. This is a portable device that records your heartbeat and can help to diagnose abnormal heartbeats. It allows your health care provider to track your heart activity for several days, if needed.  Stress tests. These can be done through exercise or by taking medicine that makes your heart beat more quickly.  Blood tests.  Imaging tests. TREATMENT  Your treatment depends on what is causing your chest pain. Treatment may include:  Medicines. These may include:  Acid blockers for heartburn.  Anti-inflammatory medicine.  Pain medicine for inflammatory conditions.  Antibiotic medicine, if an infection is present.  Medicines to dissolve blood clots.  Medicines to treat coronary artery disease.  Supportive care for conditions that do not require medicines. This may include:  Resting.  Applying heat or cold packs to injured areas.  Limiting activities until pain decreases. HOME CARE INSTRUCTIONS  If you were prescribed an antibiotic medicine, finish it all even if you start to feel better.  Avoid any activities that bring on chest pain.  Do not use any tobacco products, including cigarettes, chewing tobacco, or electronic cigarettes. If you  need help quitting, ask your health care provider.  Do not drink alcohol.  Take medicines only as directed by your health care provider.  Keep all follow-up visits as directed by your health care provider. This is important. This includes any further testing if your chest pain does not go away.  If heartburn is the cause for your chest pain, you may be told to keep your head raised (elevated) while sleeping. This reduces the chance that acid will go from your stomach into your esophagus.  Make lifestyle changes as directed by your health care provider. These may include:  Getting regular exercise. Ask your health care provider to suggest some activities that are safe for you.  Eating a heart-healthy diet. A registered dietitian can help you to learn healthy eating options.  Maintaining a healthy weight.  Managing diabetes, if necessary.  Reducing stress. SEEK MEDICAL CARE IF:  Your chest pain does not go away after treatment.  You have a rash with blisters on your chest.  You have a fever. SEEK IMMEDIATE MEDICAL CARE IF:   Your chest pain is worse.  You have an increasing cough, or you cough up blood.  You have severe  abdominal pain.  You have severe weakness.  You faint.  You have chills.  You have sudden, unexplained chest discomfort.  You have sudden, unexplained discomfort in your arms, back, neck, or jaw.  You have shortness of breath at any time.  You suddenly start to sweat, or your skin gets clammy.  You feel nauseous or you vomit.  You suddenly feel light-headed or dizzy.  Your heart begins to beat quickly, or it feels like it is skipping beats. These symptoms may represent a serious problem that is an emergency. Do not wait to see if the symptoms will go away. Get medical help right away. Call your local emergency services (911 in the [place] Do not drive yourself to the hospital.   This information is not intended to replace advice given to you by  your health care provider. Make sure you discuss any questions you have with your health care provider.   Document Released: 07/16/2005 Document Revised: 10/27/2014 Document Reviewed: 05/12/2014 Elsevier Interactive Patient Education [employer]

## 2015-11-06 ENCOUNTER — Ambulatory Visit: Payer: Self-pay | Admitting: Internal Medicine

## 2015-12-06 ENCOUNTER — Encounter (HOSPITAL_COMMUNITY): Payer: Self-pay | Admitting: *Deleted

## 2015-12-06 DIAGNOSIS — Z791 Long term (current) use of non-steroidal anti-inflammatories (NSAID): Secondary | ICD-10-CM | POA: Insufficient documentation

## 2015-12-06 DIAGNOSIS — H6501 Acute serous otitis media, right ear: Secondary | ICD-10-CM | POA: Insufficient documentation

## 2015-12-06 DIAGNOSIS — Z7984 Long term (current) use of oral hypoglycemic drugs: Secondary | ICD-10-CM | POA: Insufficient documentation

## 2015-12-06 DIAGNOSIS — Z79899 Other long term (current) drug therapy: Secondary | ICD-10-CM | POA: Insufficient documentation

## 2015-12-06 DIAGNOSIS — Z8632 Personal history of gestational diabetes: Secondary | ICD-10-CM | POA: Insufficient documentation

## 2015-12-06 DIAGNOSIS — R030 Elevated blood-pressure reading, without diagnosis of hypertension: Secondary | ICD-10-CM | POA: Insufficient documentation

## 2015-12-06 DIAGNOSIS — J45909 Unspecified asthma, uncomplicated: Secondary | ICD-10-CM | POA: Insufficient documentation

## 2015-12-06 NOTE — ED Notes (Signed)
Pt states she had onset of right ear pain tonight, last took ibuprofen at the onset

## 2015-12-07 ENCOUNTER — Emergency Department (HOSPITAL_COMMUNITY)
Admission: EM | Admit: 2015-12-07 | Discharge: 2015-12-07 | Disposition: A | Discharge status: Home / Self Care | Payer: MEDICAID | Attending: Emergency Medicine | Admitting: Emergency Medicine

## 2015-12-07 DIAGNOSIS — H6501 Acute serous otitis media, right ear: Secondary | ICD-10-CM

## 2015-12-07 MED ORDER — AMOXICILLIN-POT CLAVULANATE 875-125 MG PO TABS: 1.0000 | ORAL_TABLET | Freq: Two times a day (BID) | ORAL | Status: DC

## 2015-12-07 MED ORDER — AMOXICILLIN-POT CLAVULANATE 875-125 MG PO TABS
1.0000 | ORAL_TABLET | Freq: Once | ORAL | Status: AC
  Administered 2015-12-07: 1 via ORAL
  Filled 2015-12-07: qty 1

## 2015-12-07 MED ORDER — HYDROCODONE-ACETAMINOPHEN 5-325 MG PO TABS: 1.0000 | ORAL_TABLET | Freq: Four times a day (QID) | ORAL | Status: DC | PRN

## 2015-12-07 NOTE — ED Provider Notes (Signed)
CSN: [id number]     Arrival date & time 12/06/15  2343 History   First MD Initiated Contact with Patient 12/07/15 [phone]     Chief Complaint  Patient presents with  . Otalgia     (Consider location/radiation/quality/duration/timing/severity/associated sxs/prior Treatment) Patient is a 40 y.o. female presenting with ear pain.  Otalgia Location:  Right Quality:  Aching Onset quality:  Gradual Progression:  Worsening Chronicity:  New Relieved by:  Nothing Worsened by:  Nothing tried Ineffective treatments:  OTC medications  Kylie Baker is a 40 y.o. female who presents to the ED with right ear pain that started approximately 8 pm. She has been taking ibuprofen without relief. She reports having an infection about 6 months ago in the left ear. She denies cough, cold, congestion, fever or other problems.   Past Medical History  Diagnosis Date  . Pregnancy induced hypertension   . Asthma   . Gestational diabetes 01/25/2012   Past Surgical History  Procedure Laterality Date  . Cesarean section  06/06/2010  . Appendectomy  1980's  . Cesarean section  03/27/2012    Procedure: CESAREAN SECTION;  Surgeon: Kathreen Cosier, MD;  Location: [facility];  Service: Gynecology;  Laterality: N/A;  Repeat Cesarean Section Delivery   Boy  @ 0005 , Apgars 8/9  . Cholecystectomy  04/22/2012    Procedure: LAPAROSCOPIC CHOLECYSTECTOMY WITH INTRAOPERATIVE CHOLANGIOGRAM;  Surgeon: Mariella Saa, MD;  Location: [facility];  Service: General;  Laterality: N/A;  . Umbilical hernia repair  09/07/2012    Procedure: HERNIA REPAIR UMBILICAL ADULT;  Surgeon: Adolph Pollack, MD;  Location: [facility];  Service: General;  Laterality: N/A;  Repair of Incisional Incarcarated hernia   Family History  Problem Relation Age of Onset  . Anesthesia problems Neg Hx   . Hypertension Mother   . Diabetes Father   . Asthma Father    Social History  Substance Use Topics  . Smoking status: Never Smoker   . Smokeless tobacco:  Never Used  . Alcohol Use: No   OB History    Gravida Para Term Preterm AB TAB SAB Ectopic Multiple Living   0 1 0 0 2     Review of Systems  HENT: Positive for ear pain.   all other systems negative.    Allergies  Aspirin  Home Medications   Prior to Admission medications   Medication Sig Start Date End Date Taking? Authorizing Provider  amLODipine (NORVASC) 10 MG tablet Take 10 mg by mouth daily.     Historical Provider, MD  amoxicillin-clavulanate (AUGMENTIN) 875-125 MG tablet Take 1 tablet by mouth 2 (two) times daily. 12/07/15   Hope Orlene Och, NP  Cholecalciferol (VITAMIN D3) 5000 UNITS CAPS Take 3 capsules by mouth daily.    Historical Provider, MD  dapagliflozin propanediol (FARXIGA) 5 MG TABS tablet Take 5 mg by mouth daily.    Historical Provider, MD  HYDROcodone-acetaminophen (NORCO) 5-325 MG tablet Take 1 tablet by mouth every 6 (six) hours as needed. 12/07/15   Hope Orlene Och, NP  lisinopril (PRINIVIL,ZESTRIL) 20 MG tablet TAKE 1 TABLET BY MOUTH EVERY DAY 09/22/14   Carlus Pavlov, MD  lovastatin (MEVACOR) 20 MG tablet Take 20 mg by mouth at bedtime.    Historical Provider, MD  metFORMIN (GLUCOPHAGE) 850 MG tablet Take 850 mg by mouth 2 (two) times daily with a meal.    Historical Provider, MD  naproxen (NAPROSYN) 375 MG tablet Take 1 tablet (375 mg  total) by mouth 2 (two) times daily with a meal. 10/15/15   Danelle Berry, PA-C   BP 159/108 mmHg  Pulse 87  Temp(Src) 98.2 F (36.8 C) (Oral)  Resp 18  Wt 106.34 kg  SpO2 99%  LMP 12/03/2015 Physical Exam  Constitutional: She is oriented to person, place, and time. She appears well-developed and well-nourished. No distress.  Appears uncomfortable  HENT:  Head: Normocephalic and atraumatic.  Right Ear: Tympanic membrane is erythematous and bulging.  Left Ear: Tympanic membrane normal.  Nose: Nose normal.  Mouth/Throat: Uvula is midline, oropharynx is clear and moist and mucous membranes are normal.  Eyes:  Conjunctivae and EOM are normal.  Neck: Neck supple.  Cardiovascular: Normal rate and regular rhythm.   Elevated BP  Pulmonary/Chest: Effort normal and breath sounds normal.  Musculoskeletal: Normal range of motion.  Neurological: She is alert and oriented to person, place, and time. No cranial nerve deficit.  Skin: Skin is warm and dry.  Psychiatric: She has a normal mood and affect. Her behavior is normal.  Nursing note and vitals reviewed.   ED Course  Procedures   MDM  40 y.o. female with right ear pain stable for d/c without fever or mastoid tenderness and does not appear toxic. Referral to ENT, will start antibiotics, Augmentin and hydrocodone for pain and she will continue ibuprofen as needed. She will return here as needed for problems.   Final diagnoses:  Right acute serous otitis media, recurrence not specified      Janne Napoleon, NP 12/07/15 1610  Dione Booze, MD 12/07/15 [phone]

## 2015-12-07 NOTE — Discharge Instructions (Signed)
Do not take the narcotic pain medication if driving as it will make you sleepy. Follow up with the ENT doctor for further evaluation of your ear. Continue to take the ibuprofen.   Otitis Media, Adult Otitis media is redness, soreness, and inflammation of the middle ear. Otitis media may be caused by allergies or, most commonly, by infection. Often it occurs as a complication of the common cold. SIGNS AND SYMPTOMS Symptoms of otitis media may include:  Earache.  Fever.  Ringing in your ear.  Headache.  Leakage of fluid from the ear. DIAGNOSIS To diagnose otitis media, your health care provider will examine your ear with an otoscope. This is an instrument that allows your health care provider to see into your ear in order to examine your eardrum. Your health care provider also will ask you questions about your symptoms. TREATMENT  Typically, otitis media resolves on its own within 3-5 days. Your health care provider may prescribe medicine to ease your symptoms of pain. If otitis media does not resolve within 5 days or is recurrent, your health care provider may prescribe antibiotic medicines if he or she suspects that a bacterial infection is the cause. HOME CARE INSTRUCTIONS   If you were prescribed an antibiotic medicine, finish it all even if you start to feel better.  Take medicines only as directed by your health care provider.  Keep all follow-up visits as directed by your health care provider. SEEK MEDICAL CARE IF:  You have otitis media only in one ear, or bleeding from your nose, or both.  You notice a lump on your neck.  You are not getting better in 3-5 days.  You feel worse instead of better. SEEK IMMEDIATE MEDICAL CARE IF:   You have pain that is not controlled with medicine.  You have swelling, redness, or pain around your ear or stiffness in your neck.  You notice that part of your face is paralyzed.  You notice that the bone behind your ear (mastoid) is  tender when you touch it. MAKE SURE YOU:   Understand these instructions.  Will watch your condition.  Will get help right away if you are not doing well or get worse.   This information is not intended to replace advice given to you by your health care provider. Make sure you discuss any questions you have with your health care provider.   Document Released: 07/11/2004 Document Revised: 10/27/2014 Document Reviewed: 05/03/2013 Elsevier Interactive Patient Education [employer]

## 2015-12-07 NOTE — ED Notes (Signed)
Patient is alert and orientedx4.  Patient was explained discharge instructions and they understood them with no questions.   

## 2016-07-17 ENCOUNTER — Ambulatory Visit (HOSPITAL_COMMUNITY)
Admission: RE | Admit: 2016-07-17 | Discharge: 2016-07-17 | Disposition: A | Discharge status: Home / Self Care | Payer: Self-pay | Source: Ambulatory Visit | Attending: Obstetrics and Gynecology | Admitting: Obstetrics and Gynecology

## 2016-07-17 ENCOUNTER — Encounter (HOSPITAL_COMMUNITY): Payer: Self-pay

## 2016-07-17 VITALS — BP 122/86 | Temp 98.6°F | Wt 229.2 lb

## 2016-07-17 DIAGNOSIS — Z1239 Encounter for other screening for malignant neoplasm of breast: Secondary | ICD-10-CM

## 2016-07-17 HISTORY — DX: Disorder of thyroid, unspecified: E07.9

## 2016-07-17 NOTE — Patient Instructions (Signed)
Explained breast self awareness with Kylie Baker.Patient did not need a Pap smear today due to last Pap smear was in March 2017per patient. Let her know [facility] will cover Pap smears every 3 years unless has a history of abnormal Pap smears. Referred [facility] to [facility] for a right breast biopsy per recommendation. Appointment scheduled for Monday, July 21, 2016 at 1000. Patient aware of appointment and will be there. Kylie Baker verbalized understanding.  Ledora Delker, Kathaleen Maserhristine Poll, RN 11:43 AM

## 2016-07-17 NOTE — Progress Notes (Signed)
No complaints today. Patient referred to [facility] by [facility] due to recommending a right breast biopsy. Screening mammogram completed 07/07/2016 and right breast diagnostic mammogram and ultrasound completed 07/11/2016.    Pap Smear: Pap smear not completed today. [facility] [address] and normal per patient. Per patient has no history of an abnormal Pap smear. No Pap smear results are in EPIC.   Physical exam: Breasts Breasts symmetrical. No skin abnormalities bilateral breasts. No nipple retraction bilateral breasts. No nipple discharge bilateral breasts. No lymphadenopathy. No lumps palpated bilateral breasts. No complaints of pain or tenderness on exam. [facility] for a right breast biopsy per recommendation. Appointment scheduled for Monday, July 21, 2016 at 1000.        Pelvic/Bimanual No Pap smear completed today since last Pap smear was in March 2017 per patient. Pap smear not indicated per [facility] guidelines.   Smoking History: Patient has never smoked.  Patient Navigation: Patient education provided. [facility].

## 2016-07-21 ENCOUNTER — Other Ambulatory Visit: Payer: Self-pay | Admitting: Radiology

## 2016-07-22 ENCOUNTER — Encounter (HOSPITAL_COMMUNITY): Payer: Self-pay | Admitting: *Deleted

## 2016-08-19 ENCOUNTER — Encounter (HOSPITAL_COMMUNITY): Payer: Self-pay | Admitting: *Deleted

## 2016-09-26 ENCOUNTER — Encounter (HOSPITAL_COMMUNITY): Payer: Self-pay | Admitting: Emergency Medicine

## 2016-09-26 ENCOUNTER — Emergency Department (HOSPITAL_COMMUNITY): Payer: No Typology Code available for payment source

## 2016-09-26 ENCOUNTER — Emergency Department (HOSPITAL_COMMUNITY)
Admission: EM | Admit: 2016-09-26 | Discharge: 2016-09-26 | Disposition: A | Discharge status: Home / Self Care | Payer: No Typology Code available for payment source | Attending: Emergency Medicine | Admitting: Emergency Medicine

## 2016-09-26 DIAGNOSIS — Z79899 Other long term (current) drug therapy: Secondary | ICD-10-CM | POA: Insufficient documentation

## 2016-09-26 DIAGNOSIS — I471 Supraventricular tachycardia: Secondary | ICD-10-CM | POA: Insufficient documentation

## 2016-09-26 DIAGNOSIS — Z7984 Long term (current) use of oral hypoglycemic drugs: Secondary | ICD-10-CM | POA: Insufficient documentation

## 2016-09-26 DIAGNOSIS — J45909 Unspecified asthma, uncomplicated: Secondary | ICD-10-CM | POA: Insufficient documentation

## 2016-09-26 DIAGNOSIS — D649 Anemia, unspecified: Secondary | ICD-10-CM

## 2016-09-26 LAB — CBC
HCT: 29.4 % — ABNORMAL LOW (ref 36.0–46.0)
HEMOGLOBIN: 7.9 g/dL — AB (ref 12.0–15.0)
MCH: 15.1 pg — AB (ref 26.0–34.0)
MCHC: 26.9 g/dL — ABNORMAL LOW (ref 30.0–36.0)
MCV: 56.1 fL — AB (ref 78.0–100.0)
Platelets: 221 10*3/uL (ref 150–400)
RBC: 5.24 MIL/uL — AB (ref 3.87–5.11)
RDW: 21.3 % — ABNORMAL HIGH (ref 11.5–15.5)
WBC: 8.4 10*3/uL (ref 4.0–10.5)

## 2016-09-26 LAB — BASIC METABOLIC PANEL
ANION GAP: 12 (ref 5–15)
BUN: 7 mg/dL (ref 6–20)
CHLORIDE: 103 mmol/L (ref 101–111)
CO2: 22 mmol/L (ref 22–32)
Calcium: 8.9 mg/dL (ref 8.9–10.3)
Creatinine, Ser: 0.53 mg/dL (ref 0.44–1.00)
GFR calc non Af Amer: >60 mL/min (ref >60)
Glucose, Bld: 139 mg/dL — ABNORMAL HIGH (ref 65–99)
Potassium: 4 mmol/L (ref 3.5–5.1)
Sodium: 137 mmol/L (ref 135–145)

## 2016-09-26 LAB — TSH: TSH: 2.59 u[IU]/mL (ref 0.350–4.500)

## 2016-09-26 LAB — I-STAT TROPONIN, ED: Troponin i, poc: 0 ng/mL (ref 0.00–0.08)

## 2016-09-26 LAB — MAGNESIUM: Magnesium: 1.9 mg/dL (ref 1.7–2.4)

## 2016-09-26 MED ORDER — SODIUM CHLORIDE 0.9 % IV BOLUS (SEPSIS)
1000.0000 mL | Freq: Once | INTRAVENOUS | Status: AC
  Administered 2016-09-26: 1000 mL via INTRAVENOUS

## 2016-09-26 MED ORDER — FERROUS SULFATE 325 (65 FE) MG PO TABS: 325.0000 mg | ORAL_TABLET | Freq: Every day | ORAL | 0 refills | Status: AC

## 2016-09-26 NOTE — ED Notes (Signed)
Pt verbalized understanding discharge instructions and denies any further needs or questions at this time. VS stable, ambulatory and steady gait.   

## 2016-09-26 NOTE — ED Provider Notes (Signed)
[facility]EM[facility]Y DEPT Provider Note   CSN: [id number] Arrival date & time: 09/26/16  1711     History   Chief Complaint Chief Complaint  Patient presents with  . Tachycardia    HPI Kylie Baker is a 40 y.o. female.  HPI  Pt presenting with c/o high heart rate- she felt sudden onset of chest pressure, feeling lightheaded and shortness of breath.  She called EMS.  No syncope.  Per EMS her HR was 210-  She was given adenosine which resolved the SVT- HR decreased and patient had immediate resolution of symptoms.  She states she has been told she has a hx of anemia in the past.  She sometimes feels lightheaded when she stands up but does not faint.  She has been told in the past to eat more iron containing vegetables.  Has never required a blood transfusion.  She states she has heavy flow during her menstrual periods, denies blood in stool.  No melena.  There are no other associated systemic symptoms, there are no other alleviating or modifying factors.   Past Medical History:  Diagnosis Date  . Asthma   . Gestational diabetes 01/25/2012  . Pregnancy induced hypertension   . Thyroid disease     Patient Active Problem List   Diagnosis Date Noted  . Goiter, nodular 07/04/2015  . Vitamin D deficiency 03/27/2015  . Unspecified essential hypertension 04/13/2014  . Hypercalcemia 02/09/2014  . Subclinical hyperthyroidism 02/09/2014  . Incarcerated incisional hernia-s/p repair 09/07/12 10/05/2012  . Supervision of high-risk pregnancy 01/25/2012  . Gestational diabetes 01/25/2012  . Chronic hypertension complicating or reason for care during pregnancy 01/25/2012  . Asthma 01/25/2012    Past Surgical History:  Procedure Laterality Date  . APPENDECTOMY  1980's  . CESAREAN SECTION  06/06/2010  . CESAREAN SECTION  03/27/2012   Procedure: CESAREAN SECTION;  Surgeon: Kathreen Cosier, MD;  Location: [facility];  Service: Gynecology;  Laterality: N/A;  Repeat Cesarean Section Delivery   Boy  @  0005 , Apgars 8/9  . CHOLECYSTECTOMY  04/22/2012   Procedure: LAPAROSCOPIC CHOLECYSTECTOMY WITH INTRAOPERATIVE CHOLANGIOGRAM;  Surgeon: Mariella Saa, MD;  Location: [facility];  Service: General;  Laterality: N/A;  . THYROIDECTOMY    . UMBILICAL HERNIA REPAIR  09/07/2012   Procedure: HERNIA REPAIR UMBILICAL ADULT;  Surgeon: Adolph Pollack, MD;  Location: [facility];  Service: General;  Laterality: N/A;  Repair of Incisional Incarcarated hernia    OB History    Gravida Para Term Preterm AB Living   3 2 1 1 1 2    SAB TAB Ectopic Multiple Live Births   1 0 0 0 2       Home Medications    Prior to Admission medications   Medication Sig Start Date End Date Taking? Authorizing Provider  Cholecalciferol (VITAMIN D3) 5000 UNITS CAPS Take 3 capsules by mouth daily.   Yes Historical Provider, MD  levothyroxine (SYNTHROID, LEVOTHROID) 125 MCG tablet Take 125 mcg by mouth daily before breakfast.   Yes Historical Provider, MD  lisinopril (PRINIVIL,ZESTRIL) 20 MG tablet TAKE 1 TABLET BY MOUTH EVERY DAY 09/22/14  Yes Carlus Pavlov, MD  lovastatin (MEVACOR) 20 MG tablet Take 20 mg by mouth at bedtime.   Yes Historical Provider, MD  metFORMIN (GLUCOPHAGE) 850 MG tablet Take 850 mg by mouth 2 (two) times daily with a meal.   Yes Historical Provider, MD  dapagliflozin propanediol (FARXIGA) 5 MG TABS tablet Take 5 mg by mouth daily.  Historical Provider, MD  ferrous sulfate 325 (65 FE) MG tablet Take 1 tablet (325 mg total) by mouth daily. 09/26/16   Jerelyn ScottMartha Linker, MD    Family History Family History  Problem Relation Age of Onset  . Hypertension Mother   . Diabetes Father   . Asthma Father   . Hypertension Father   . Anesthesia problems Neg Hx     Social History Social History  Substance Use Topics  . Smoking status: Never Smoker  . Smokeless tobacco: Never Used  . Alcohol use No     Allergies   Aspirin   Review of Systems Review of Systems  ROS reviewed and all otherwise  negative except for mentioned in HPI   Physical Exam Updated Vital Signs BP 126/79   Pulse 91   Temp 98 F (36.7 C) (Oral)   Resp 14   Ht 5\' 4"  (1.626 m)   Wt 104.3 kg   LMP 09/11/2016   SpO2 99%   BMI 39.48 kg/m  Vitals reviewed Physical Exam Physical Examination: General appearance - alert, well appearing, and in no distress Mental status - alert, oriented to person, place, and time Eyes - no conjunctival injection, no scleral icterus Mouth - mucous membranes moist, pharynx normal without lesions Chest - clear to auscultation, no wheezes, rales or rhonchi, symmetric air entry Heart - normal rate, regular rhythm, normal S1, S2, no murmurs, rubs, clicks or gallops Abdomen - soft, nontender, nondistended, no masses or organomegaly Neurological - alert, oriented, normal speech Extremities - peripheral pulses normal, no pedal edema, no clubbing or cyanosis Skin - normal coloration and turgor, no rashes  ED Treatments / Results  Labs (all labs ordered are listed, but only abnormal results are displayed) Labs Reviewed  BASIC METABOLIC PANEL - Abnormal; Notable for the following:       Result Value   Glucose, Bld 139 (*)    All other components within normal limits  CBC - Abnormal; Notable for the following:    RBC 5.24 (*)    Hemoglobin 7.9 (*)    HCT 29.4 (*)    MCV 56.1 (*)    MCH 15.1 (*)    MCHC 26.9 (*)    RDW 21.3 (*)    All other components within normal limits  MAGNESIUM  TSH  I-STAT TROPOININ, ED    EKG  EKG Interpretation  Date/Time:  Friday September 26 2016 17:23:56 EST Ventricular Rate:  100 PR Interval:    QRS Duration: 95 QT Interval:  364 QTC Calculation: 470 R Axis:   35 Text Interpretation:  Sinus tachycardia Since previous tracing rate faster but no significant other changes Confirmed by Karma GanjaLINKER  MD, MARTHA [phone]) on 09/26/2016 6:20:46 PM Also confirmed by Karma GanjaLINKER  MD, MARTHA [phone]([id number]), editor [place]LOGAN, Cala BradfordKIMBERLY [phone]([id number])  on 09/27/2016 11:00:00 AM        Radiology Dg Chest 2 View  Result Date: 09/26/2016 CLINICAL DATA:  Acute onset of shortness of breath and chest pressure today. EXAM: CHEST  2 VIEW COMPARISON:  Radiograph 10/15/2015 FINDINGS: The cardiomediastinal contours are normal. The lungs are clear. Pulmonary vasculature is normal. No consolidation, pleural effusion, or pneumothorax. No acute osseous abnormalities are seen. IMPRESSION: No acute pulmonary process. Electronically Signed   By: Rubye OaksMelanie  Ehinger M.D.   On: 09/26/2016 18:41    Procedures Procedures (including critical care time)  Medications Ordered in ED Medications  sodium chloride 0.9 % bolus 1,000 mL (0 mLs Intravenous Stopped 09/26/16 2218)     Initial  Impression / Assessment and Plan / ED Course  I have reviewed the triage vital signs and the nursing notes.  Pertinent labs & imaging results that were available during my care of the patient were reviewed by me and considered in my medical decision making (see chart for details).  Clinical Course   6:26 PM went to see patient, she is not in room  Pt presenting after episode of SVT which resolved after EMS administered adenosine.  Pt currently has no symptoms in the ED.  She remains in sinus rhythm on EKG and monitor.  Labs show normal electrolytes, TSH.  She is anemic- at 7.9- she states she has hx of anemia and has been told to increase iron intake.  Has never had transfusion.  Denies blood in stool or melena.  She does endorse heavy flow with menstrual periods- this is the most likely cause of her anemia.  No syncope.  Given rx for ferrous sulfate and advised recheck with PMD.  Pt referred also to cardiology due to SVT.  Discharged with strict return precautions.  Pt agreeable with plan.  Final Clinical Impressions(s) / ED Diagnoses   Final diagnoses:  SVT (supraventricular tachycardia) (HCC)  Anemia, unspecified type    New Prescriptions Discharge Medication List as of 09/26/2016 10:33 PM    START  taking these medications   Details  ferrous sulfate 325 (65 FE) MG tablet Take 1 tablet (325 mg total) by mouth daily., Starting Fri 09/26/2016, Print         Jerelyn ScottMartha Linker, MD 09/27/16 [phone]

## 2016-09-26 NOTE — Discharge Instructions (Addendum)
Return to the ED with any concerns including chest pain, fainting, difficulty breathing, recurrent fast heart rate, decreased level of alertness/lethargy, or any other alarming symptoms

## 2016-09-26 NOTE — ED Triage Notes (Signed)
Per EMS: Pt took her son to the bus stop at 1500 and when she got back inside she started to feel SOB, dizzy and had chest pressure.  Pt call EMS. On scene the HR was 210, BP 140/106, 100% O2, and RR 20.  Pt was diaphoretic and c/o nausea. Pt received 6mg  of Adenosine and her HR came down to 105, pt also said her nausea went away, pain/pressure in her chest stopped and she stopped sweating .  Pt has received 100 NS.

## 2016-09-26 NOTE — ED Notes (Signed)
Called main lab to add on magnesium ?

## 2017-01-02 ENCOUNTER — Emergency Department (HOSPITAL_COMMUNITY)
Admission: EM | Admit: 2017-01-02 | Discharge: 2017-01-02 | Disposition: A | Discharge status: Home / Self Care | Payer: No Typology Code available for payment source | Attending: Emergency Medicine | Admitting: Emergency Medicine

## 2017-01-02 ENCOUNTER — Encounter (HOSPITAL_COMMUNITY): Payer: Self-pay | Admitting: Emergency Medicine

## 2017-01-02 ENCOUNTER — Emergency Department (HOSPITAL_COMMUNITY): Payer: No Typology Code available for payment source

## 2017-01-02 DIAGNOSIS — I471 Supraventricular tachycardia: Secondary | ICD-10-CM

## 2017-01-02 DIAGNOSIS — Z7984 Long term (current) use of oral hypoglycemic drugs: Secondary | ICD-10-CM | POA: Insufficient documentation

## 2017-01-02 DIAGNOSIS — J45909 Unspecified asthma, uncomplicated: Secondary | ICD-10-CM | POA: Insufficient documentation

## 2017-01-02 DIAGNOSIS — I10 Essential (primary) hypertension: Secondary | ICD-10-CM | POA: Insufficient documentation

## 2017-01-02 DIAGNOSIS — Z79899 Other long term (current) drug therapy: Secondary | ICD-10-CM | POA: Insufficient documentation

## 2017-01-02 LAB — BASIC METABOLIC PANEL
ANION GAP: 11 (ref 5–15)
BUN: 9 mg/dL (ref 6–20)
CHLORIDE: 102 mmol/L (ref 101–111)
CO2: 27 mmol/L (ref 22–32)
Calcium: 9.1 mg/dL (ref 8.9–10.3)
Creatinine, Ser: 0.56 mg/dL (ref 0.44–1.00)
GFR calc non Af Amer: >60 mL/min (ref >60)
Glucose, Bld: 181 mg/dL — ABNORMAL HIGH (ref 65–99)
POTASSIUM: 3.6 mmol/L (ref 3.5–5.1)
Sodium: 140 mmol/L (ref 135–145)

## 2017-01-02 LAB — CBC
HEMATOCRIT: 42.1 % (ref 36.0–46.0)
HEMOGLOBIN: 12.7 g/dL (ref 12.0–15.0)
MCH: 21.6 pg — AB (ref 26.0–34.0)
MCHC: 30.2 g/dL (ref 30.0–36.0)
MCV: 71.6 fL — AB (ref 78.0–100.0)
Platelets: 171 10*3/uL (ref 150–400)
RBC: 5.88 MIL/uL — AB (ref 3.87–5.11)
RDW: 19.5 % — ABNORMAL HIGH (ref 11.5–15.5)
WBC: 7.4 10*3/uL (ref 4.0–10.5)

## 2017-01-02 NOTE — ED Triage Notes (Signed)
Pt BIB EMS from hair salon. Pt started feeling like her heart was racing. Hx of same once last year. Denies CP, N/V/D, recent illness. Per EMS, pt in SVT around the 210s initially. Pt able to vagal down to the 180s. Pt on amlodipine, lisinopril, metformin. BP 138/100. CAOx4

## 2017-01-02 NOTE — ED Provider Notes (Signed)
[facility] Provider Note   CSN: [id number] Arrival date & time: 01/02/17  2013     History   Chief Complaint Chief Complaint  Patient presents with  . Tachycardia    HPI Kylie Baker is a 41 y.o. female PMH SVT, HTN, anemia, thyroid disorder presents via EMS for SVT. Pt was in hair salon and felt sudden onset palpitations similar to previous episodes. Reportedly SVT 210s on EMS arrival, able to lower HR with vagal maneuvers. No missed doses medication. Denies CP, dyspnea, light-headedness. Pt feels back to baseline on arrival to ED.   The history is provided by the patient and the EMS personnel.  Palpitations   This is a recurrent problem. The current episode started 1 to 2 hours ago. The problem occurs rarely. The problem has been resolved. On average, each episode lasts 10 minutes. Pertinent negatives include no diaphoresis, no fever, no malaise/fatigue, no numbness, no chest pain, no chest pressure, no claudication, no exertional chest pressure, no near-syncope, no syncope, no abdominal pain, no nausea, no vomiting, no headaches, no back pain, no leg pain, no lower extremity edema, no dizziness, no weakness, no cough and no shortness of breath. She has tried breathing exercises for the symptoms. The treatment provided significant relief. Her past medical history is significant for anemia. Her past medical history does not include heart disease.    Past Medical History:  Diagnosis Date  . Asthma   . Gestational diabetes 01/25/2012  . Pregnancy induced hypertension   . Thyroid disease     Patient Active Problem List   Diagnosis Date Noted  . Goiter, nodular 07/04/2015  . Vitamin D deficiency 03/27/2015  . Unspecified essential hypertension 04/13/2014  . Hypercalcemia 02/09/2014  . Subclinical hyperthyroidism 02/09/2014  . Incarcerated incisional hernia-s/p repair 09/07/12 10/05/2012  . Supervision of high-risk pregnancy 01/25/2012  . Gestational diabetes 01/25/2012    . Chronic hypertension complicating or reason for care during pregnancy 01/25/2012  . Asthma 01/25/2012    Past Surgical History:  Procedure Laterality Date  . APPENDECTOMY  1980's  . CESAREAN SECTION  06/06/2010  . CESAREAN SECTION  03/27/2012   Procedure: CESAREAN SECTION;  Surgeon: Kathreen Cosier, MD;  Location: [facility];  Service: Gynecology;  Laterality: N/A;  Repeat Cesarean Section Delivery   Boy  @ 0005 , Apgars 8/9  . CHOLECYSTECTOMY  04/22/2012   Procedure: LAPAROSCOPIC CHOLECYSTECTOMY WITH INTRAOPERATIVE CHOLANGIOGRAM;  Surgeon: Mariella Saa, MD;  Location: [facility];  Service: General;  Laterality: N/A;  . THYROIDECTOMY    . UMBILICAL HERNIA REPAIR  09/07/2012   Procedure: HERNIA REPAIR UMBILICAL ADULT;  Surgeon: Adolph Pollack, MD;  Location: [facility];  Service: General;  Laterality: N/A;  Repair of Incisional Incarcarated hernia    OB History    Gravida Para Term Preterm AB Living   3 2 1 1 1 2    SAB TAB Ectopic Multiple Live Births   1 0 0 0 2       Home Medications    Prior to Admission medications   Medication Sig Start Date End Date Taking? Authorizing Provider  Cholecalciferol (VITAMIN D3) 5000 UNITS CAPS Take 3 capsules by mouth daily.    Historical Provider, MD  dapagliflozin propanediol (FARXIGA) 5 MG TABS tablet Take 5 mg by mouth daily.    Historical Provider, MD  ferrous sulfate 325 (65 FE) MG tablet Take 1 tablet (325 mg total) by mouth daily. 09/26/16   Jerelyn Scott, MD  levothyroxine (SYNTHROID, LEVOTHROID) 125  MCG tablet Take 125 mcg by mouth daily before breakfast.    Historical Provider, MD  lisinopril (PRINIVIL,ZESTRIL) 20 MG tablet TAKE 1 TABLET BY MOUTH EVERY DAY 09/22/14   Carlus Pavlov, MD  lovastatin (MEVACOR) 20 MG tablet Take 20 mg by mouth at bedtime.    Historical Provider, MD  metFORMIN (GLUCOPHAGE) 850 MG tablet Take 850 mg by mouth 2 (two) times daily with a meal.    Historical Provider, MD    Family History Family History   Problem Relation Age of Onset  . Hypertension Mother   . Diabetes Father   . Asthma Father   . Hypertension Father   . Anesthesia problems Neg Hx     Social History Social History  Substance Use Topics  . Smoking status: Never Smoker  . Smokeless tobacco: Never Used  . Alcohol use No     Allergies   Aspirin   Review of Systems Review of Systems  Constitutional: Negative for diaphoresis, fever and malaise/fatigue.  Eyes: Negative for visual disturbance.  Respiratory: Negative for cough, chest tightness and shortness of breath.   Cardiovascular: Positive for palpitations. Negative for chest pain, claudication, leg swelling, syncope and near-syncope.  Gastrointestinal: Negative for abdominal pain, nausea and vomiting.  Musculoskeletal: Negative for back pain.  Neurological: Negative for dizziness, weakness, numbness and headaches.  All other systems reviewed and are negative.    Physical Exam Updated Vital Signs BP (!) 133/96   Pulse 98   Temp 99.3 F (37.4 C) (Oral)   Resp 15   Ht 5\' 4"  (1.626 m)   Wt 106.1 kg   LMP 01/02/2017 (Exact Date)   SpO2 99%   BMI 40.17 kg/m   Physical Exam  Constitutional: She is oriented to person, place, and time. She appears well-developed and well-nourished. No distress.  HENT:  Head: Normocephalic and atraumatic.  Eyes: Conjunctivae and EOM are normal.  Neck: Normal range of motion. Neck supple.  Cardiovascular: Regular rhythm and normal heart sounds.  Tachycardia present.   No murmur heard. Pulmonary/Chest: Effort normal and breath sounds normal. No respiratory distress.  Abdominal: Soft. There is no tenderness.  Musculoskeletal: Normal range of motion. She exhibits no edema.  Neurological: She is alert and oriented to person, place, and time.  Skin: Skin is warm and dry. Capillary refill takes less than 2 seconds.  Psychiatric: She has a normal mood and affect.  Nursing note and vitals reviewed.    ED Treatments /  Results  Labs (all labs ordered are listed, but only abnormal results are displayed) Labs Reviewed  BASIC METABOLIC PANEL - Abnormal; Notable for the following:       Result Value   Glucose, Bld 181 (*)    All other components within normal limits  CBC - Abnormal; Notable for the following:    RBC 5.88 (*)    MCV 71.6 (*)    MCH 21.6 (*)    RDW 19.5 (*)    All other components within normal limits    EKG  EKG Interpretation  Date/Time:  Friday January 02 2017 20:21:25 EDT Ventricular Rate:  111 PR Interval:    QRS Duration: 77 QT Interval:  327 QTC Calculation: 445 R Axis:   157 Text Interpretation:  Right and left arm electrode reversal, interpretation assumes no reversal Sinus tachycardia Right axis deviation Nonspecific T abnormalities, lateral leads Confirmed by Jodi Mourning MD, JOSHUA [phone]) on 01/02/2017 8:51:52 PM       Radiology Dg Chest 2 View  Result  Date: 01/02/2017 CLINICAL DATA:  Reason for exam: pt c/o of tachycardia this evening with SOB; denies CP; denies fever. Medical hx: asthma, diabetes, HTN (takes medication), thyroid disease. EXAM: CHEST  2 VIEW COMPARISON:  09/26/2016 FINDINGS: Normal mediastinum and cardiac silhouette. Normal pulmonary vasculature. No evidence of effusion, infiltrate, or pneumothorax. No acute bony abnormality. IMPRESSION: No active cardiopulmonary disease. Electronically Signed   By: Genevive BiStewart  Edmunds M.D.   On: 01/02/2017 21:36    Procedures Procedures (including critical care time)  Medications Ordered in ED Medications - No data to display   Initial Impression / Assessment and Plan / ED Course  I have reviewed the triage vital signs and the nursing notes.  Pertinent labs & imaging results that were available during my care of the patient were reviewed by me and considered in my medical decision making (see chart for details).    41 y.o. female p/w palpitations. SVT w/EMS resolved PTA s/p vagal maneuvers. Physical exam unremarkable.  EKG sinus tachycardia, HR 110, no widened QRS or ischemic changes. CXR unremarkable. BMP, CBC wnl. Hb stable 12.7. Observed in ED w/o recurrence of symptoms. Appropriate for discharge home with out-patient follow-up. Return precautions given. Pt voiced understanding and agreement with plan.   Discussed with my attending physician, Dr Jodi MourningZavitz.  Final Clinical Impressions(s) / ED Diagnoses   Final diagnoses:  SVT (supraventricular tachycardia) [facility](HCC)    New Prescriptions Discharge Medication List as of 01/02/2017  9:47 PM       Pablo LedgerElizabeth Mitchell Jostin Rue, MD 01/03/17 [id number]    Blane OharaJoshua Zavitz, MD 01/03/17 [phone]

## 2017-01-02 NOTE — ED Notes (Signed)
Patient transported to X-ray 

## 2017-07-11 ENCOUNTER — Encounter (HOSPITAL_COMMUNITY): Payer: Self-pay | Admitting: *Deleted

## 2017-07-11 ENCOUNTER — Inpatient Hospital Stay (HOSPITAL_COMMUNITY)
Admission: AD | Admit: 2017-07-11 | Discharge: 2017-07-11 | Disposition: A | Discharge status: Home / Self Care | Payer: Self-pay | Source: Ambulatory Visit | Attending: Obstetrics & Gynecology | Admitting: Obstetrics & Gynecology

## 2017-07-11 ENCOUNTER — Inpatient Hospital Stay (HOSPITAL_COMMUNITY): Payer: Self-pay

## 2017-07-11 DIAGNOSIS — I10 Essential (primary) hypertension: Secondary | ICD-10-CM

## 2017-07-11 DIAGNOSIS — O10919 Unspecified pre-existing hypertension complicating pregnancy, unspecified trimester: Secondary | ICD-10-CM

## 2017-07-11 DIAGNOSIS — E119 Type 2 diabetes mellitus without complications: Secondary | ICD-10-CM | POA: Insufficient documentation

## 2017-07-11 DIAGNOSIS — O10011 Pre-existing essential hypertension complicating pregnancy, first trimester: Secondary | ICD-10-CM | POA: Insufficient documentation

## 2017-07-11 DIAGNOSIS — Z3A09 9 weeks gestation of pregnancy: Secondary | ICD-10-CM | POA: Insufficient documentation

## 2017-07-11 DIAGNOSIS — O209 Hemorrhage in early pregnancy, unspecified: Secondary | ICD-10-CM | POA: Insufficient documentation

## 2017-07-11 DIAGNOSIS — O24111 Pre-existing diabetes mellitus, type 2, in pregnancy, first trimester: Secondary | ICD-10-CM | POA: Insufficient documentation

## 2017-07-11 DIAGNOSIS — O3680X0 Pregnancy with inconclusive fetal viability, not applicable or unspecified: Secondary | ICD-10-CM

## 2017-07-11 DIAGNOSIS — O208 Other hemorrhage in early pregnancy: Secondary | ICD-10-CM

## 2017-07-11 DIAGNOSIS — O10911 Unspecified pre-existing hypertension complicating pregnancy, first trimester: Secondary | ICD-10-CM

## 2017-07-11 HISTORY — DX: Pre-existing type 2 diabetes mellitus, in pregnancy, first trimester: O24.111

## 2017-07-11 HISTORY — DX: Type 2 diabetes mellitus without complications: E11.9

## 2017-07-11 LAB — URINALYSIS, ROUTINE W REFLEX MICROSCOPIC
Bilirubin Urine: NEGATIVE
GLUCOSE, UA: NEGATIVE mg/dL
Hgb urine dipstick: NEGATIVE
KETONES UR: NEGATIVE mg/dL
LEUKOCYTES UA: NEGATIVE
NITRITE: NEGATIVE
PH: 5 (ref 5.0–8.0)
Protein, ur: 30 mg/dL — AB
Specific Gravity, Urine: 1.027 (ref 1.005–1.030)

## 2017-07-11 LAB — CBC
HCT: 36.3 % (ref 36.0–46.0)
Hemoglobin: 11.3 g/dL — ABNORMAL LOW (ref 12.0–15.0)
MCH: 23.2 pg — AB (ref 26.0–34.0)
MCHC: 31.1 g/dL (ref 30.0–36.0)
MCV: 74.4 fL — AB (ref 78.0–100.0)
PLATELETS: 140 10*3/uL — AB (ref 150–400)
RBC: 4.88 MIL/uL (ref 3.87–5.11)
RDW: 15.5 % (ref 11.5–15.5)
WBC: 6.6 10*3/uL (ref 4.0–10.5)

## 2017-07-11 LAB — POCT PREGNANCY, URINE: Preg Test, Ur: POSITIVE — AB

## 2017-07-11 LAB — WET PREP, GENITAL
SPERM: NONE SEEN
TRICH WET PREP: NONE SEEN
Yeast Wet Prep HPF POC: NONE SEEN

## 2017-07-11 LAB — HCG, QUANTITATIVE, PREGNANCY: hCG, Beta Chain, Quant, S: 1503 m[IU]/mL — ABNORMAL HIGH (ref <5)

## 2017-07-11 MED ORDER — LABETALOL HCL 100 MG PO TABS
200.0000 mg | ORAL_TABLET | Freq: Once | ORAL | Status: AC
  Administered 2017-07-11: 200 mg via ORAL
  Filled 2017-07-11: qty 2

## 2017-07-11 MED ORDER — LABETALOL HCL 200 MG PO TABS: 100.0000 mg | ORAL_TABLET | Freq: Two times a day (BID) | ORAL | 2 refills | Status: DC

## 2017-07-11 NOTE — MAU Note (Signed)
Venia Carbon NP notified of severe range pressures.

## 2017-07-11 NOTE — MAU Provider Note (Signed)
History     CSN: [id number]  Arrival date and time: 07/11/17 1700   First Provider Initiated Contact with Patient 07/11/17 1833      Chief Complaint  Patient presents with  . Vaginal Bleeding  . Possible Pregnancy   HPI   Ms.Kylie Baker is a 41 y.o. female [phone] @ [redacted]w[redacted]d with vaginal bleeding. States she noticed light vaginal bleeding today and yesterday. She notices it only when she wipes. She denies pain. History of uncontrolled essential HTN; taking methyldopa. Last dose was this morning. She is taking it BID; 250 mg. No HA, or changes in her vision. Has had trouble managing her BP for "while".   OB History    Gravida Para Term Preterm AB Living   4 2 1 1 1 2    SAB TAB Ectopic Multiple Live Births   1 0 0 0 2      Past Medical History:  Diagnosis Date  . Asthma   . Gestational diabetes 01/25/2012  . Pregnancy induced hypertension   . Thyroid disease     Past Surgical History:  Procedure Laterality Date  . APPENDECTOMY  1980's  . CESAREAN SECTION  06/06/2010  . CESAREAN SECTION  03/27/2012   Procedure: CESAREAN SECTION;  Surgeon: Kathreen Cosier, MD;  Location: [facility];  Service: Gynecology;  Laterality: N/A;  Repeat Cesarean Section Delivery   Boy  @ 0005 , Apgars 8/9  . CHOLECYSTECTOMY  04/22/2012   Procedure: LAPAROSCOPIC CHOLECYSTECTOMY WITH INTRAOPERATIVE CHOLANGIOGRAM;  Surgeon: Mariella Saa, MD;  Location: [facility];  Service: General;  Laterality: N/A;  . THYROIDECTOMY    . UMBILICAL HERNIA REPAIR  09/07/2012   Procedure: HERNIA REPAIR UMBILICAL ADULT;  Surgeon: Adolph Pollack, MD;  Location: [facility];  Service: General;  Laterality: N/A;  Repair of Incisional Incarcarated hernia    Family History  Problem Relation Age of Onset  . Hypertension Mother   . Diabetes Father   . Asthma Father   . Hypertension Father   . Anesthesia problems Neg Hx     Social History  Substance Use Topics  . Smoking status: Never Smoker  . Smokeless tobacco: Never  Used  . Alcohol use No    Allergies:  Allergies  Allergen Reactions  . Aspirin Itching and Rash    Prescriptions Prior to Admission  Medication Sig Dispense Refill Last Dose  . Cholecalciferol (VITAMIN D3) 5000 UNITS CAPS Take 3 capsules by mouth daily.   09/26/2016 at Unknown time  . dapagliflozin propanediol (FARXIGA) 5 MG TABS tablet Take 5 mg by mouth daily.   on hold  . ferrous sulfate 325 (65 FE) MG tablet Take 1 tablet (325 mg total) by mouth daily. 30 tablet 0   . levothyroxine (SYNTHROID, LEVOTHROID) 125 MCG tablet Take 125 mcg by mouth daily before breakfast.   09/26/2016 at Unknown time  . lisinopril (PRINIVIL,ZESTRIL) 20 MG tablet TAKE 1 TABLET BY MOUTH EVERY DAY 30 tablet 0 09/26/2016 at Unknown time  . lovastatin (MEVACOR) 20 MG tablet Take 20 mg by mouth at bedtime.   09/25/2016 at Unknown time  . metFORMIN (GLUCOPHAGE) 850 MG tablet Take 850 mg by mouth 2 (two) times daily with a meal.   09/26/2016 at Unknown time   Results for orders placed or performed during the hospital encounter of 07/11/17 (from the past 48 hour(s))  Urinalysis, Routine w reflex microscopic     Status: Abnormal   Collection Time: 07/11/17  5:36 PM  Result Value Ref Range  Color, Urine YELLOW YELLOW   APPearance HAZY (A) CLEAR   Specific Gravity, Urine 1.027 1.005 - 1.030   pH 5.0 5.0 - 8.0   Glucose, UA NEGATIVE NEGATIVE mg/dL   Hgb urine dipstick NEGATIVE NEGATIVE   Bilirubin Urine NEGATIVE NEGATIVE   Ketones, ur NEGATIVE NEGATIVE mg/dL   Protein, ur 30 (A) NEGATIVE mg/dL   Nitrite NEGATIVE NEGATIVE   Leukocytes, UA NEGATIVE NEGATIVE   RBC / HPF 0-5 0 - 5 RBC/hpf   WBC, UA 0-5 0 - 5 WBC/hpf   Bacteria, UA RARE (A) NONE SEEN   Squamous Epithelial / LPF 0-5 (A) NONE SEEN   Mucus PRESENT   Pregnancy, urine POC     Status: Abnormal   Collection Time: 07/11/17  5:57 PM  Result Value Ref Range   Preg Test, Ur POSITIVE (A) NEGATIVE    Comment:        THE SENSITIVITY OF THIS METHODOLOGY IS >24  mIU/mL   CBC     Status: Abnormal   Collection Time: 07/11/17  7:12 PM  Result Value Ref Range   WBC 6.6 4.0 - 10.5 K/uL   RBC 4.88 3.87 - 5.11 MIL/uL   Hemoglobin 11.3 (L) 12.0 - 15.0 g/dL   HCT 40.9 81.1 - 91.4 %   MCV 74.4 (L) 78.0 - 100.0 fL   MCH 23.2 (L) 26.0 - 34.0 pg   MCHC 31.1 30.0 - 36.0 g/dL   RDW 78.2 95.6 - 21.3 %   Platelets 140 (L) 150 - 400 K/uL  Wet prep, genital     Status: Abnormal   Collection Time: 07/11/17  7:38 PM  Result Value Ref Range   Yeast Wet Prep HPF POC NONE SEEN NONE SEEN   Trich, Wet Prep NONE SEEN NONE SEEN   Clue Cells Wet Prep HPF POC PRESENT (A) NONE SEEN   WBC, Wet Prep HPF POC FEW (A) NONE SEEN    Comment: MODERATE BACTERIA SEEN   Sperm NONE SEEN    Review of Systems  Constitutional: Negative for fever.  Eyes: Negative for photophobia and visual disturbance.  Gastrointestinal: Positive for nausea.  Genitourinary: Positive for vaginal bleeding.  Neurological: Negative for headaches.   Physical Exam   Blood pressure (!) 169/96, pulse 67, temperature 98.8 F (37.1 C), temperature source Oral, resp. rate 17, height 5' 3.5" (1.613 m), weight 243 lb (110.2 kg), last menstrual period 05/07/2017, SpO2 99 %.  Patient Vitals for the past 24 hrs:  BP Temp Temp src Pulse Resp SpO2 Height Weight  07/11/17 2215 (!) 159/91 - - - - - - -  07/11/17 2200 (!) 162/97 - - 76 - - - -  07/11/17 2159 (!) 162/97 98.3 F (36.8 C) Oral 75 18 - - -  07/11/17 2124 (!) 145/77 - - 75 - - - -  07/11/17 2122 (!) 145/77 - - 75 - - - -  07/11/17 1915 (!) 169/96 - - 67 - - - -  07/11/17 1900 (!) 167/99 - - 71 - - - -  07/11/17 1845 (!) 176/99 - - 70 - - - -  07/11/17 1830 (!) 175/103 - - 70 - - - -  07/11/17 1815 (!) 177/105 - - 73 - - - -  07/11/17 1800 (!) 170/111 - - 75 - - - -  07/11/17 1751 (!) 179/102 - - 74 - - - -  07/11/17 1736 (!) 186/114 98.8 F (37.1 C) Oral 80 17 99 % 5' 3.5" (1.613 m) 243  lb (110.2 kg)   Physical Exam  Constitutional: She is  oriented to person, place, and time. She appears well-developed and well-nourished. No distress.  HENT:  Head: Normocephalic.  Eyes: Pupils are equal, round, and reactive to light.  Respiratory: Effort normal.  GI: Soft. She exhibits no distension. There is no tenderness. There is no rebound and no guarding.  Genitourinary:  Genitourinary Comments: Vagina - Small amount of mucoid, bloody vaginal discharge, no odor  Cervix -  no active bleeding  Bimanual exam: Cervix closed, anterior  Uterus non tender, enlarged  Adnexa non tender, no masses bilaterally GC/Chlam, wet prep done Chaperone present for exam.   Musculoskeletal: Normal range of motion.  Neurological: She is alert and oriented to person, place, and time.  Skin: Skin is warm. She is not diaphoretic.  Psychiatric: Her behavior is normal.   MAU Course  Procedures  None  MDM  Wet prep & GC HIV, CBC, Hcg, ABO US OB transvaginal  B positive blood type  Report given to [place] who assumes care of the patient.  Patient awaiting [place].   - Vaginal bleeding in pregnancy with pregnancy of unknown anatomic location. Bleeding stable. Will repeat Quant in 48 hours. Ectopic precautions reviewed. - Uncontrolled chronic hypertension on Aldomet. Blood pressure improved on labetalol. No evidence of end organ involvement. Plan to bring down blood pressure slowly with labetalol per consult with Dr. Macon Large. Blood pressure check in 48 hours. Severe hypertension precautions reviewed with patient.   Results for orders placed or performed during the hospital encounter of 07/11/17 (from the past 24 hour(s))  Urinalysis, Routine w reflex microscopic     Status: Abnormal   Collection Time: 07/11/17  5:36 PM  Result Value Ref Range   Color, Urine YELLOW YELLOW   APPearance HAZY (A) CLEAR   Specific Gravity, Urine 1.027 1.005 - 1.030   pH 5.0 5.0 - 8.0   Glucose, UA NEGATIVE NEGATIVE mg/dL   Hgb urine dipstick NEGATIVE NEGATIVE    Bilirubin Urine NEGATIVE NEGATIVE   Ketones, ur NEGATIVE NEGATIVE mg/dL   Protein, ur 30 (A) NEGATIVE mg/dL   Nitrite NEGATIVE NEGATIVE   Leukocytes, UA NEGATIVE NEGATIVE   RBC / HPF 0-5 0 - 5 RBC/hpf   WBC, UA 0-5 0 - 5 WBC/hpf   Bacteria, UA RARE (A) NONE SEEN   Squamous Epithelial / LPF 0-5 (A) NONE SEEN   Mucus PRESENT   Pregnancy, urine POC     Status: Abnormal   Collection Time: 07/11/17  5:57 PM  Result Value Ref Range   Preg Test, Ur POSITIVE (A) NEGATIVE  CBC     Status: Abnormal   Collection Time: 07/11/17  7:12 PM  Result Value Ref Range   WBC 6.6 4.0 - 10.5 K/uL   RBC 4.88 3.87 - 5.11 MIL/uL   Hemoglobin 11.3 (L) 12.0 - 15.0 g/dL   HCT 40.9 81.1 - 91.4 %   MCV 74.4 (L) 78.0 - 100.0 fL   MCH 23.2 (L) 26.0 - 34.0 pg   MCHC 31.1 30.0 - 36.0 g/dL   RDW 78.2 95.6 - 21.3 %   Platelets 140 (L) 150 - 400 K/uL  hCG, quantitative, pregnancy     Status: Abnormal   Collection Time: 07/11/17  7:12 PM  Result Value Ref Range   hCG, Beta Chain, Quant, S 1,503 (H) <5 mIU/mL  Wet prep, genital     Status: Abnormal   Collection Time: 07/11/17  7:38 PM  Result Value Ref  Range   Yeast Wet Prep HPF POC NONE SEEN NONE SEEN   Trich, Wet Prep NONE SEEN NONE SEEN   Clue Cells Wet Prep HPF POC PRESENT (A) NONE SEEN   WBC, Wet Prep HPF POC FEW (A) NONE SEEN   Sperm NONE SEEN      US Ob Comp Less 14 Wks  Result Date: 07/11/2017 CLINICAL DATA:  Bleeding during pregnancy. Estimated gestational age by LMP is 9 weeks 2 days. Quantitative beta HCG is 1,503 EXAM: OBSTETRIC <14 WK [place] AND TRANSVAGINAL OB US TECHNIQUE: Both transabdominal and transvaginal ultrasound examinations were performed for complete evaluation of the gestation as well as the maternal uterus, adnexal regions, and pelvic cul-de-sac. Transvaginal technique was performed to assess early pregnancy. COMPARISON:  None. FINDINGS: Intrauterine gestational sac: A single intrauterine gestational sac is identified. The gestational sac  is irregular and somewhat contracted. There is internal debris and internal swirling blood within the gestational sac. Yolk sac:  Not Visualized. Embryo:  Not Visualized. Cardiac Activity: Not Visualized. MSD: 17.5  mm   6 w   4  d Subchorionic hemorrhage:  None visualized. Maternal uterus/adnexae: Uterus is retroverted. No myometrial mass lesions identified. Both ovaries are visualized and appear normal. No abnormal adnexal masses. No free fluid in the pelvis. IMPRESSION: An abnormal appearing intrauterine gestational sac is identified. Size of gestational sac is small for clinical dates. No fetal pole or yolk sac are seen. Appearance is suspicious for abnormal gestation or loss of pregnancy in progress. An occult ectopic pregnancy is unlikely but not entirely excluded. Followup ultrasound is recommended in 10-14 days for further evaluation. Electronically Signed   By: Burman Nieves M.D.   On: 07/11/2017 21:22   US Ob Transvaginal  Result Date: 07/11/2017 CLINICAL DATA:  Bleeding during pregnancy. Estimated gestational age by LMP is 9 weeks 2 days. Quantitative beta HCG is 1,503 EXAM: OBSTETRIC <14 WK [place] AND TRANSVAGINAL OB US TECHNIQUE: Both transabdominal and transvaginal ultrasound examinations were performed for complete evaluation of the gestation as well as the maternal uterus, adnexal regions, and pelvic cul-de-sac. Transvaginal technique was performed to assess early pregnancy. COMPARISON:  None. FINDINGS: Intrauterine gestational sac: A single intrauterine gestational sac is identified. The gestational sac is irregular and somewhat contracted. There is internal debris and internal swirling blood within the gestational sac. Yolk sac:  Not Visualized. Embryo:  Not Visualized. Cardiac Activity: Not Visualized. MSD: 17.5  mm   6 w   4  d Subchorionic hemorrhage:  None visualized. Maternal uterus/adnexae: Uterus is retroverted. No myometrial mass lesions identified. Both ovaries are visualized and appear  normal. No abnormal adnexal masses. No free fluid in the pelvis. IMPRESSION: An abnormal appearing intrauterine gestational sac is identified. Size of gestational sac is small for clinical dates. No fetal pole or yolk sac are seen. Appearance is suspicious for abnormal gestation or loss of pregnancy in progress. An occult ectopic pregnancy is unlikely but not entirely excluded. Followup ultrasound is recommended in 10-14 days for further evaluation. Electronically Signed   By: Burman Nieves M.D.   On: 07/11/2017 21:22     Assessment and Plan   1. Pregnancy of unknown anatomic location   2. Vaginal bleeding in pregnancy, first trimester   3. Uncontrolled hypertension   4. Chronic hypertension during pregnancy, antepartum   5. Type 2 diabetes mellitus affecting pregnancy in first trimester, antepartum   6. Maternal chronic hypertension in first trimester     Discharge home in stable condition per  consult with Dr. Macon Large. Quant and blood pressure check in 48 hours. DC Aldomet and start labetalol 200 mg by mouth twice a day. Hypertension precautions reviewed. Pelvic rest 1 week. Bleeding precautions. Follow-up Information    [facility] Follow up on 07/13/2017.   Specialty:  Obstetrics and Gynecology Why:  For repeat bloodwork and blood pressure check Contact information: [address] [place] [address] [phone]       [facility] MATERNITY ADMISSIONS Follow up.   Why:  In pregnancy emergencies Contact information: [address] [id number] mc [place] [address] [phone]         Allergies as of 07/11/2017      Reactions   Aspirin Itching, Rash      Medication List    STOP taking these medications   FARXIGA 5 MG Tabs tablet Generic drug:  dapagliflozin propanediol   lisinopril 20 MG tablet Commonly known as:  PRINIVIL,ZESTRIL   lovastatin 20 MG tablet Commonly known  as:  MEVACOR   methyldopa 250 MG tablet Commonly known as:  ALDOMET     TAKE these medications   ferrous sulfate 325 (65 FE) MG tablet Take 1 tablet (325 mg total) by mouth daily.   labetalol 200 MG tablet Commonly known as:  NORMODYNE Take 0.5 tablets (100 mg total) by mouth 2 (two) times daily.   levothyroxine 125 MCG tablet Commonly known as:  SYNTHROID, LEVOTHROID Take 125 mcg by mouth daily before breakfast.   metFORMIN 850 MG tablet Commonly known as:  GLUCOPHAGE Take 850 mg by mouth 2 (two) times daily with a meal.   Vitamin D3 5000 units Caps Take 3 capsules by mouth daily.            Discharge Care Instructions        Start     Ordered   07/11/17 0000  Discharge patient    Question Answer Comment  Discharge disposition 01-Home or Self Care   Discharge patient date 07/11/2017      07/11/17 2226   07/11/17 0000  labetalol (NORMODYNE) 200 MG tablet  2 times daily    Question:  Supervising Provider  Answer:  Tereso Newcomer   07/11/17 2226     Eberardo [place], [place], CNM 07/11/2017 11:04 PM

## 2017-07-11 NOTE — MAU Note (Signed)
Pt stated she had a headache and stated "it is because I am hungry." provider notified and stated she can be d/c.

## 2017-07-11 NOTE — MAU Note (Signed)
Pt reports she has had some bleeding off/on for the last few days. Positive preg test

## 2017-07-11 NOTE — Discharge Instructions (Signed)
Vaginal Bleeding During Pregnancy, First Trimester A small amount of bleeding (spotting) from the vagina is relatively common in early pregnancy. It usually stops on its own. Various things may cause bleeding or spotting in early pregnancy. Some bleeding may be related to the pregnancy, and some may not. In most cases, the bleeding is normal and is not a problem. However, bleeding can also be a sign of something serious. Be sure to tell your health care provider about any vaginal bleeding right away. Some possible causes of vaginal bleeding during the first trimester include:  Infection or inflammation of the cervix.  Growths (polyps) on the cervix.  Miscarriage or threatened miscarriage.  Pregnancy tissue has developed outside of the uterus and in a fallopian tube (tubal pregnancy).  Tiny cysts have developed in the uterus instead of pregnancy tissue (molar pregnancy).  Follow these instructions at home: Watch your condition for any changes. The following actions may help to lessen any discomfort you are feeling:  Follow your health care provider's instructions for limiting your activity. If your health care provider orders bed rest, you may need to stay in bed and only get up to use the bathroom. However, your health care provider may allow you to continue light activity.  If needed, make plans for someone to help with your regular activities and responsibilities while you are on bed rest.  Keep track of the number of pads you use each day, how often you change pads, and how soaked (saturated) they are. Write this down.  Do not use tampons. Do not douche.  Do not have sexual intercourse or orgasms until approved by your health care provider.  If you pass any tissue from your vagina, save the tissue so you can show it to your health care provider.  Only take over-the-counter or prescription medicines as directed by your health care provider.  Do not take aspirin because it can make  you bleed.  Keep all follow-up appointments as directed by your health care provider.  Contact a health care provider if:  You have any vaginal bleeding during any part of your pregnancy.  You have cramps or labor pains.  You have a fever, not controlled by medicine. Get help right away if:  You have severe cramps in your back or belly (abdomen).  You pass large clots or tissue from your vagina.  Your bleeding increases.  You feel light-headed or weak, or you have fainting episodes.  You have chills.  You are leaking fluid or have a gush of fluid from your vagina.  You pass out while having a bowel movement. This information is not intended to replace advice given to you by your health care provider. Make sure you discuss any questions you have with your health care provider. Document Released: 07/16/2005 Document Revised: 03/13/2016 Document Reviewed: 06/13/2013 Elsevier Interactive Patient Education  2018 [employer].  Hypertension During Pregnancy Hypertension, commonly called high blood pressure, is when the force of blood pumping through your arteries is too strong. Arteries are blood vessels that carry blood from the heart throughout the body. Hypertension during pregnancy can cause problems for you and your baby. Your baby may be born early (prematurely) or may not weigh as much as he or she should at birth. Very bad cases of hypertension during pregnancy can be life-threatening. Different types of hypertension can occur during pregnancy. These include:  Chronic hypertension. This happens when: ? You have hypertension before pregnancy and it continues during pregnancy. ? You develop  hypertension before you are [redacted] weeks pregnant, and it continues during pregnancy.  Gestational hypertension. This is hypertension that develops after the 20th week of pregnancy.  Preeclampsia, also called toxemia of pregnancy. This is a very serious type of hypertension that develops only  during pregnancy. It affects the whole body, and it can be very dangerous for you and your baby.  Gestational hypertension and preeclampsia usually go away within 6 weeks after your baby is born. Women who have hypertension during pregnancy have a greater chance of developing hypertension later in life or during future pregnancies. What are the causes? The exact cause of hypertension is not known. What increases the risk? There are certain factors that make it more likely for you to develop hypertension during pregnancy. These include:  Having hypertension during a previous pregnancy or prior to pregnancy.  Being overweight.  Being older than age 86.  Being pregnant for the first time or being pregnant with more than one baby.  Becoming pregnant using fertilization methods such as IVF (in vitro fertilization).  Having diabetes, kidney problems, or systemic lupus erythematosus.  Having a family history of hypertension.  What are the signs or symptoms? Chronic hypertension and gestational hypertension rarely cause symptoms. Preeclampsia causes symptoms, which may include:  Increased protein in your urine. Your health care provider will check for this at every visit before you give birth (prenatal visit).  Severe headaches.  Sudden weight gain.  Swelling of the hands, face, legs, and feet.  Nausea and vomiting.  Vision problems, such as blurred or double vision.  Numbness in the face, arms, legs, and feet.  Dizziness.  Slurred speech.  Sensitivity to bright lights.  Abdominal pain.  Convulsions.  How is this diagnosed? You may be diagnosed with hypertension during a routine prenatal exam. At each prenatal visit, you may:  Have a urine test to check for high amounts of protein in your urine.  Have your blood pressure checked. A blood pressure reading is recorded as two numbers, such as "120 over 80" (or 120/80). The first ("top") number is called the systolic  pressure. It is a measure of the pressure in your arteries when your heart beats. The second ("bottom") number is called the diastolic pressure. It is a measure of the pressure in your arteries as your heart relaxes between beats. Blood pressure is measured in a unit called mm Hg. A normal blood pressure reading is: ? Systolic: below 470. ? Diastolic: below 80.  The type of hypertension that you are diagnosed with depends on your test results and when your symptoms developed.  Chronic hypertension is usually diagnosed before 20 weeks of pregnancy.  Gestational hypertension is usually diagnosed after 20 weeks of pregnancy.  Hypertension with high amounts of protein in the urine is diagnosed as preeclampsia.  Blood pressure measurements that stay above 962 systolic, or above 836 diastolic, are signs of severe preeclampsia.  How is this treated? Treatment for hypertension during pregnancy varies depending on the type of hypertension you have and how serious it is.  If you take medicines called ACE inhibitors to treat chronic hypertension, you may need to switch medicines. ACE inhibitors should not be taken during pregnancy.  If you have gestational hypertension, you may need to take blood pressure medicine.  If you are at risk for preeclampsia, your health care provider may recommend that you take a low-dose aspirin every day to prevent high blood pressure during your pregnancy.  If you have severe preeclampsia, you  may need to be hospitalized so you and your baby can be monitored closely. You may also need to take medicine (magnesium sulfate) to prevent seizures and to lower blood pressure. This medicine may be given as an injection or through an IV tube.  In some cases, if your condition gets worse, you may need to deliver your baby early.  Follow these instructions at home: Eating and drinking  Drink enough fluid to keep your urine clear or pale yellow.  Eat a healthy diet that is low  in salt (sodium). Do not add salt to your food. Check food labels to see how much sodium a food or beverage contains. Lifestyle  Do not use any products that contain nicotine or tobacco, such as cigarettes and e-cigarettes. If you need help quitting, ask your health care provider.  Do not use alcohol.  Avoid caffeine.  Avoid stress as much as possible. Rest and get plenty of sleep. General instructions  Take over-the-counter and prescription medicines only as told by your health care provider.  While lying down, lie on your left side. This keeps pressure off your baby.  While sitting or lying down, raise (elevate) your feet. Try putting some pillows under your lower legs.  Exercise regularly. Ask your health care provider what kinds of exercise are best for you.  Keep all prenatal and follow-up visits as told by your health care provider. This is important. Contact a health care provider if:  You have symptoms that your health care provider told you may require more treatment or monitoring, such as: ? Fever. ? Vomiting. ? Headache. Get help right away if:  You have severe abdominal pain or vomiting that does not get better with treatment.  You suddenly develop swelling in your hands, ankles, or face.  You gain 4 lbs (1.8 kg) or more in 1 week.  You develop vaginal bleeding, or you have blood in your urine.  You do not feel your baby moving as much as usual.  You have blurred or double vision.  You have muscle twitching or sudden tightening (spasms).  You have shortness of breath.  Your lips or fingernails turn blue. This information is not intended to replace advice given to you by your health care provider. Make sure you discuss any questions you have with your health care provider. Document Released: 06/24/2011 Document Revised: 04/25/2016 Document Reviewed: 03/21/2016 Elsevier Interactive Patient Education  2018 [employer].   Type 1 or Type 2 Diabetes Mellitus  During Pregnancy, Self Care Caring for yourself during your pregnancy when you have type 1 diabetes (type 1 diabetes mellitus) or type 2 diabetes (type 2 diabetes mellitus) means keeping your blood sugar (glucose) under control with a balance of:  Nutrition.  Exercise.  Lifestyle changes.  Insulin or medicines, if necessary.  Support from your team of health care providers and others.  The following information explains what you need to know to manage your diabetes at home during your pregnancy. What do I need to do to manage my blood glucose?  Check your blood glucose every day, as often as told by your health care provider.  Contact your health care provider if your blood glucose is above your target for 2 tests in a row.  Have your A1c (hemoglobin A1c) level checked at least two times a year, or as often as told by your health care provider. Your health care provider will set individualized treatment goals for you. Generally, the goal of treatment is to maintain  the following blood glucose levels during pregnancy:  After not eating (after fasting) for 8 hours: at or below 95 mg/dL (5.3 mmol/L).  After meals (postprandial): ? One hour after a meal: at or below 140 mg/dL (7.8 mmol/L). ? Two hours after a meal: at or below 120 mg/dL (6.7 mmol/L).  A1c level: 6-6.5%  What do I need to know about hyperglycemia and hypoglycemia? What is hyperglycemia? Hyperglycemia, also called high blood glucose, occurs when blood glucose is too high. Make sure you know the early signs of hyperglycemia, such as:  Increased thirst.  Hunger.  Feeling very tired.  Needing to urinate more often than usual.  Blurry vision.  What is hypoglycemia? Hypoglycemia, also called low blood glucose, occurs with a blood glucose level at or below 70 mg/dL (3.9 mmol/L). The risk for hypoglycemia increases during or after exercise, during sleep, during illness, and when skipping meals or fasting. It is  important to know the symptoms of hypoglycemia and treat it right away. Always have a 15-gram rapid-acting carbohydrate snack with you to treat low blood glucose. Family members and close friends should also know the symptoms and should understand how to treat hypoglycemia, in case you are not able to treat yourself. What are the symptoms of hypoglycemia? Hypoglycemia symptoms can include:  Hunger.  Anxiety.  Sweating and feeling clammy.  Confusion.  Dizziness or feeling light-headed.  Sleepiness.  Nausea.  Increased heart rate.  Headache.  Blurry vision.  Seizure.  Nightmares.  Tingling or numbness around the mouth, lips, or tongue.  A change in speech.  Decreased ability to concentrate.  A change in coordination.  Restless sleep.  Tremors or shakes.  Fainting.  Irritability.  How do I treat hypoglycemia?  If you are alert and able to swallow safely, follow the 15:15 rule:  Take 15 grams of a rapid-acting carbohydrate. Rapid-acting options include: ? 1 tube of glucose gel. ? 3 glucose pills. ? 6-8 pieces of hard candy. ? 4 oz (120 mL) of fruit juice . ? 4 oz (120 mL) of regular (not diet) soda.  Check your blood glucose 15 minutes after you take the carbohydrate.  If the repeat blood glucose level is still at or below 70 mg/dL (3.9 mmol/L), take 15 grams of a carbohydrate again.  If your blood glucose level does not increase above 70 mg/dL (3.9 mmol/L) after 3 tries, seek emergency medical care.  After your blood glucose level returns to normal, eat a meal or a snack within 1 hour.  How do I treat severe hypoglycemia? Severe hypoglycemia is when your blood glucose level is at or below 54 mg/dL (3 mmol/L). Severe hypoglycemia is an emergency. Do not wait to see if the symptoms will go away. Get medical help right away. Call your local emergency services (911 in the [place] Do not drive yourself to the hospital. If you have severe hypoglycemia and you  cannot eat or drink, you may need an injection of glucagon. A family member or close friend should learn how to check your blood glucose and how to give you a glucagon injection. Ask your health care provider if you need to have an emergency glucagon injection kit available. Severe hypoglycemia may need to be treated in a hospital. The treatment may include getting glucose through an IV tube. You may also need treatment for the cause of your hypoglycemia. Can having diabetes put me at risk for other conditions? Having diabetes can put you at risk for other long-term (chronic)  conditions, such as heart disease and kidney disease. Your health care provider may prescribe medicines to help prevent complications from diabetes. These medicines may include:  Aspirin.  Medicine to lower cholesterol.  Medicine to control blood pressure.  What else can I do to manage my diabetes? Take your diabetes medicines as told  If your health care provider prescribed insulin or diabetes medicines, take them every day.  Do not run out of insulin or other diabetes medicines that you take. Plan ahead so you always have these available.  If you use insulin, adjust your dosage based on how physically active you are and what foods you eat. Your health care provider will tell you how to adjust your dosage. Your health care provider may recommend that you take one low-dose aspirin (81 mg) each day to help prevent high blood pressure during pregnancy (preeclampsia or eclampsia). You may be at risk for preeclampsia or eclampsia if:  You had any of the following during a previous pregnancy: ? Preeclampsia or eclampsia. ? A fetal growth rate that was slower than normal. ? An early (preterm) birth. ? Separation of the placenta from the uterus (placental abruption). ? Fetal loss.  You are pregnant with more than one baby.  You have other medical conditions, such as high blood pressure or an autoimmune disease.  Make  healthy food choices  The things that you eat and drink affect your blood glucose and your insulin dosage. Making good choices helps to control your diabetes and prevent other health problems. A healthy meal plan includes eating lean proteins, complex carbohydrates, fresh fruits and vegetables, low-fat dairy products, and healthy fats. Make an appointment to see a diet and nutrition specialist (registered dietitian) to help you create an eating plan that is right for you. Make sure that you:  Follow instructions from your health care provider about eating or drinking restrictions.  Drink enough fluid to keep your urine clear or pale yellow.  Eat healthy snacks between nutritious meals.  Track the carbohydrates that you eat. Do this by reading food labels and learning the standard serving sizes of foods.  Follow your sick day plan whenever you cannot eat or drink as usual. Make this plan in advance with your health care provider.  Stay active   Do at least 30 minutes of physical activity a day, or as much physical activity as your health care provider recommends during your pregnancy.  If you start a new exercise or activity, work with your health care provider to adjust your insulin, medicines, or food intake as needed. Make healthy lifestyle choices  Do not use any tobacco products, such as cigarettes, chewing tobacco, and e-cigarettes. If you need help quitting, ask your health care provider.  Do not use alcohol.  Learn to manage stress. If you need help with this, ask your health care provider. Care for your body  Keep your immunizations up to date.  Schedule an eye exam during your first trimester of your pregnancy, or as told by your health care provider.  Check your skin and feet every day for cuts, bruises, redness, blisters, or sores. Schedule a foot exam with your health care provider once every year.  Brush your teeth and gums two times a day, and floss at least one time  a day. Visit your dentist at least once every 6 months.  Maintain a healthy weight during your pregnancy. General instructions   Take over-the-counter and prescription medicines only as told by your health care  provider.  Talk with your health care provider about your risk for high blood pressure during pregnancy (preeclampsia or eclampsia).  Share your diabetes management plan with people in your workplace, school, and household.  Check your urine ketones when you are ill and as told by your health care provider.  Carry a medical alert card or wear medical alert jewelry.  Ask your health care provider: ? Do I need to meet with a diabetes educator? ? Where can I find a support group for people with diabetes?  Keep all follow-up visits during your pregnancy (prenatal) and after delivery (postnatal) as told by your health care provider. This is important. Where to find more information: For more information about diabetes, visit:  American Diabetes Association (ADA): www.diabetes.org  American Association of Diabetes Educators (AADE): https://www.diabeteseducator.org/patient-resources  This information is not intended to replace advice given to you by your health care provider. Make sure you discuss any questions you have with your health care provider. Document Released: 01/28/2016 Document Revised: 03/13/2016 Document Reviewed: 11/09/2015 Elsevier Interactive Patient Education  [employer].

## 2017-07-11 NOTE — Progress Notes (Signed)
Patient currently taking Methyldopa 250 mg bid, had dose this morning. Rechecked BP 179/102

## 2017-07-12 LAB — HIV ANTIBODY (ROUTINE TESTING W REFLEX): HIV SCREEN 4TH GENERATION: NONREACTIVE

## 2017-07-13 LAB — GC/CHLAMYDIA PROBE AMP (~~LOC~~) NOT AT ARMC
CHLAMYDIA, DNA PROBE: NEGATIVE
NEISSERIA GONORRHEA: NEGATIVE

## 2017-07-19 ENCOUNTER — Inpatient Hospital Stay (HOSPITAL_COMMUNITY)
Admission: EM | Admit: 2017-07-19 | Discharge: 2017-07-23 | DRG: 779 | Disposition: A | Discharge status: Home / Self Care | Payer: Medicaid Other | Attending: Internal Medicine | Admitting: Internal Medicine

## 2017-07-19 ENCOUNTER — Emergency Department (HOSPITAL_COMMUNITY): Payer: Medicaid Other

## 2017-07-19 ENCOUNTER — Encounter (HOSPITAL_COMMUNITY): Payer: Self-pay

## 2017-07-19 ENCOUNTER — Inpatient Hospital Stay (HOSPITAL_COMMUNITY): Payer: Medicaid Other

## 2017-07-19 DIAGNOSIS — E890 Postprocedural hypothyroidism: Secondary | ICD-10-CM | POA: Diagnosis present

## 2017-07-19 DIAGNOSIS — O039 Complete or unspecified spontaneous abortion without complication: Secondary | ICD-10-CM

## 2017-07-19 DIAGNOSIS — N939 Abnormal uterine and vaginal bleeding, unspecified: Secondary | ICD-10-CM

## 2017-07-19 DIAGNOSIS — Z3A10 10 weeks gestation of pregnancy: Secondary | ICD-10-CM

## 2017-07-19 DIAGNOSIS — D62 Acute posthemorrhagic anemia: Secondary | ICD-10-CM | POA: Diagnosis present

## 2017-07-19 DIAGNOSIS — D649 Anemia, unspecified: Secondary | ICD-10-CM

## 2017-07-19 DIAGNOSIS — O24111 Pre-existing diabetes mellitus, type 2, in pregnancy, first trimester: Secondary | ICD-10-CM

## 2017-07-19 DIAGNOSIS — Z7984 Long term (current) use of oral hypoglycemic drugs: Secondary | ICD-10-CM

## 2017-07-19 DIAGNOSIS — J45909 Unspecified asthma, uncomplicated: Secondary | ICD-10-CM | POA: Diagnosis present

## 2017-07-19 DIAGNOSIS — O021 Missed abortion: Principal | ICD-10-CM | POA: Diagnosis present

## 2017-07-19 DIAGNOSIS — E876 Hypokalemia: Secondary | ICD-10-CM | POA: Diagnosis present

## 2017-07-19 DIAGNOSIS — R55 Syncope and collapse: Secondary | ICD-10-CM

## 2017-07-19 DIAGNOSIS — O209 Hemorrhage in early pregnancy, unspecified: Secondary | ICD-10-CM

## 2017-07-19 DIAGNOSIS — I959 Hypotension, unspecified: Secondary | ICD-10-CM | POA: Diagnosis present

## 2017-07-19 DIAGNOSIS — Z23 Encounter for immunization: Secondary | ICD-10-CM

## 2017-07-19 DIAGNOSIS — E119 Type 2 diabetes mellitus without complications: Secondary | ICD-10-CM | POA: Diagnosis present

## 2017-07-19 DIAGNOSIS — O031 Delayed or excessive hemorrhage following incomplete spontaneous abortion: Secondary | ICD-10-CM

## 2017-07-19 LAB — TROPONIN I

## 2017-07-19 LAB — ACETAMINOPHEN LEVEL: Acetaminophen (Tylenol), Serum: <10 ug/mL — ABNORMAL LOW (ref 10–30)

## 2017-07-19 LAB — I-STAT TROPONIN, ED: TROPONIN I, POC: 0 ng/mL (ref 0.00–0.08)

## 2017-07-19 LAB — ETHANOL: Alcohol, Ethyl (B): <10 mg/dL (ref <10)

## 2017-07-19 LAB — CBC
HEMATOCRIT: 35.6 % — AB (ref 36.0–46.0)
HEMATOCRIT: 27.9 % — AB (ref 36.0–46.0)
Hemoglobin: 10.7 g/dL — ABNORMAL LOW (ref 12.0–15.0)
Hemoglobin: 8.4 g/dL — ABNORMAL LOW (ref 12.0–15.0)
MCH: 22.5 pg — ABNORMAL LOW (ref 26.0–34.0)
MCH: 22.5 pg — AB (ref 26.0–34.0)
MCHC: 30.1 g/dL (ref 30.0–36.0)
MCHC: 30.1 g/dL (ref 30.0–36.0)
MCV: 74.9 fL — ABNORMAL LOW (ref 78.0–100.0)
MCV: 74.8 fL — AB (ref 78.0–100.0)
PLATELETS: 117 10*3/uL — AB (ref 150–400)
Platelets: 153 10*3/uL (ref 150–400)
RBC: 4.75 MIL/uL (ref 3.87–5.11)
RBC: 3.73 MIL/uL — ABNORMAL LOW (ref 3.87–5.11)
RDW: 14.7 % (ref 11.5–15.5)
RDW: 15.1 % (ref 11.5–15.5)
WBC: 9.7 10*3/uL (ref 4.0–10.5)
WBC: 11.1 10*3/uL — AB (ref 4.0–10.5)

## 2017-07-19 LAB — COMPREHENSIVE METABOLIC PANEL
ALBUMIN: 3.7 g/dL (ref 3.5–5.0)
ALK PHOS: 41 U/L (ref 38–126)
ALT: 14 U/L (ref 14–54)
AST: 20 U/L (ref 15–41)
Anion gap: 7 (ref 5–15)
BILIRUBIN TOTAL: 0.6 mg/dL (ref 0.3–1.2)
BUN: 6 mg/dL (ref 6–20)
CALCIUM: 8.7 mg/dL — AB (ref 8.9–10.3)
CO2: 25 mmol/L (ref 22–32)
CREATININE: 0.68 mg/dL (ref 0.44–1.00)
Chloride: 104 mmol/L (ref 101–111)
GFR calc Af Amer: >60 mL/min (ref >60)
GFR calc non Af Amer: >60 mL/min (ref >60)
GLUCOSE: 199 mg/dL — AB (ref 65–99)
POTASSIUM: 3.4 mmol/L — AB (ref 3.5–5.1)
Sodium: 136 mmol/L (ref 135–145)
TOTAL PROTEIN: 6.4 g/dL — AB (ref 6.5–8.1)

## 2017-07-19 LAB — I-STAT CHEM 8, ED
BUN: 7 mg/dL (ref 6–20)
CALCIUM ION: 1.12 mmol/L — AB (ref 1.15–1.40)
CHLORIDE: 102 mmol/L (ref 101–111)
Creatinine, Ser: 0.7 mg/dL (ref 0.44–1.00)
GLUCOSE: 197 mg/dL — AB (ref 65–99)
HCT: 36 % (ref 36.0–46.0)
Hemoglobin: 12.2 g/dL (ref 12.0–15.0)
Potassium: 3.8 mmol/L (ref 3.5–5.1)
Sodium: 139 mmol/L (ref 135–145)
TCO2: 28 mmol/L (ref 22–32)

## 2017-07-19 LAB — PREPARE RBC (CROSSMATCH)

## 2017-07-19 LAB — I-STAT BETA HCG BLOOD, ED (MC, WL, AP ONLY): I-stat hCG, quantitative: 226.3 m[IU]/mL — ABNORMAL HIGH (ref <5)

## 2017-07-19 LAB — ABO/RH: ABO/RH(D): B POS

## 2017-07-19 LAB — SALICYLATE LEVEL

## 2017-07-19 MED ORDER — SODIUM CHLORIDE 0.9 % IV BOLUS (SEPSIS)
1000.0000 mL | Freq: Once | INTRAVENOUS | Status: AC
  Administered 2017-07-19: 1000 mL via INTRAVENOUS

## 2017-07-19 MED ORDER — SODIUM CHLORIDE 0.9 % IV BOLUS (SEPSIS)
1000.0000 mL | Freq: Once | INTRAVENOUS | Status: AC
  Administered 2017-07-20: 1000 mL via INTRAVENOUS

## 2017-07-19 MED ORDER — SODIUM CHLORIDE 0.9 % IV SOLN
10.0000 mL/h | Freq: Once | INTRAVENOUS | Status: AC
Start: 2017-07-19 — End: 2017-07-20
  Administered 2017-07-20: 10 mL/h via INTRAVENOUS

## 2017-07-19 MED ORDER — SODIUM CHLORIDE 0.9 % IV SOLN
Freq: Once | INTRAVENOUS | Status: AC
  Administered 2017-07-19: 22:00:00 via INTRAVENOUS

## 2017-07-19 MED ORDER — MISOPROSTOL 200 MCG PO TABS
800.0000 ug | ORAL_TABLET | Freq: Once | ORAL | Status: AC
  Administered 2017-07-19: 800 ug via RECTAL

## 2017-07-19 MED ORDER — MISOPROSTOL 200 MCG PO TABS
800.0000 ug | ORAL_TABLET | Freq: Once | ORAL | Status: AC
  Administered 2017-07-19: 800 ug via RECTAL
  Filled 2017-07-19: qty 4

## 2017-07-19 MED ORDER — MISOPROSTOL 200 MCG PO TABS
ORAL_TABLET | ORAL | Status: AC
  Administered 2017-07-19: 800 ug via RECTAL
  Filled 2017-07-19: qty 4

## 2017-07-19 NOTE — ED Triage Notes (Signed)
Pt from home by [facility] for altered mental status. Pt was found in the bath tub not responding. Pt has stable vital signs but only responds to voice. Pt also has weak but equal grips.

## 2017-07-19 NOTE — ED Notes (Signed)
Pt receiving blood,  No lab draw at this time.

## 2017-07-19 NOTE — ED Provider Notes (Signed)
[facility]-EMER[facility]ENCY DEPT Provider Note   CSN: [id number] Arrival date & time: 07/19/17  1548     History   Chief Complaint Chief Complaint  Patient presents with  . Altered Mental Status    HPI Kylie Baker is a 41 y.o. female.  HPI   41 year old female [phone] with recent diagnosis of likely miscarriage versus less likely pregnancy of unknown location presents with concern for syncope and vaginal bleeding.  On arrival to ED, history limited by patient's mental status. While she responds to all commands and will nod or answer yes/no to questions, she does not answer all questions.  Initial hx provided primarily by sister in law who reports that patient had vaginal bleeding today, called on call physician who recommended warm compresses continuing to monitor. Unclear amount of bleeding, patient unable to quantify initially on arrival. Only reports severe abdominal pain, bilateral lower cramping beginning today.   Patient reports she took a bath and had significant bleeding, passing clots and what looked like "meat".  Family reports that she was sitting on shower seat when she suddenly went unconscious, having syncopal episode and slumping back. She did not fall out of chair on their history.  Patient on arrival reports no headache, no neck pain. Reports she feels generalized weakness, abdominal cramping, lightheadedness and vaginal bleeding. Nods head to one hour to soak a pad beginning today. Denies nausea or vomiting. Denies chest pain or dyspnea.  Denies SI, ingestion of medications or drugs.  Patient later able to answer more questions, reports soaking pad every 30-39min for 5 hours. No hx of prior bleeding, blood disorders or anticoagulation.   Past Medical History:  Diagnosis Date  . Asthma   . Gestational diabetes 01/25/2012  . Pregnancy induced hypertension   . Thyroid disease   . Type 2 diabetes mellitus affecting pregnancy in first trimester, antepartum 07/11/2017    Patient  Active Problem List   Diagnosis Date Noted  . Syncope 07/20/2017  . Symptomatic anemia 07/20/2017  . Miscarriage 07/20/2017  . Type 2 diabetes mellitus affecting pregnancy in first trimester, antepartum 07/11/2017  . Goiter, nodular 07/04/2015  . Vitamin D deficiency 03/27/2015  . Unspecified essential hypertension 04/13/2014  . Hypercalcemia 02/09/2014  . Subclinical hyperthyroidism 02/09/2014  . Incarcerated incisional hernia-s/p repair 09/07/12 10/05/2012  . Supervision of high-risk pregnancy 01/25/2012  . Gestational diabetes 01/25/2012  . Chronic hypertension complicating or reason for care during pregnancy 01/25/2012  . Asthma 01/25/2012    Past Surgical History:  Procedure Laterality Date  . APPENDECTOMY  1980's  . CESAREAN SECTION  06/06/2010  . CESAREAN SECTION  03/27/2012   Procedure: CESAREAN SECTION;  Surgeon: Kathreen Cosier, MD;  Location: [facility];  Service: Gynecology;  Laterality: N/A;  Repeat Cesarean Section Delivery   Boy  @ 0005 , Apgars 8/9  . CHOLECYSTECTOMY  04/22/2012   Procedure: LAPAROSCOPIC CHOLECYSTECTOMY WITH INTRAOPERATIVE CHOLANGIOGRAM;  Surgeon: Mariella Saa, MD;  Location: [facility];  Service: General;  Laterality: N/A;  . THYROIDECTOMY    . UMBILICAL HERNIA REPAIR  09/07/2012   Procedure: HERNIA REPAIR UMBILICAL ADULT;  Surgeon: Adolph Pollack, MD;  Location: [facility];  Service: General;  Laterality: N/A;  Repair of Incisional Incarcarated hernia    OB History    Gravida Para Term Preterm AB Living   SAB TAB Ectopic Multiple Live Births   1 0 0 0 2       Home Medications  Prior to Admission medications   Medication Sig Start Date End Date Taking? Authorizing Provider  ferrous sulfate 325 (65 FE) MG tablet Take 1 tablet (325 mg total) by mouth daily. 09/26/16  Yes Mabe, Latanya Maudlin, MD  levothyroxine (SYNTHROID, LEVOTHROID) 125 MCG tablet Take 125 mcg by mouth daily before breakfast.   Yes [provider]    lisinopril (PRINIVIL,ZESTRIL) 20 MG tablet Take 20 mg by mouth daily.   Yes [provider]  metFORMIN (GLUCOPHAGE) 850 MG tablet Take 850 mg by mouth 2 (two) times daily with a meal.   Yes [provider]    Family History Family History  Problem Relation Age of Onset  . Hypertension Mother   . Diabetes Father   . Asthma Father   . Hypertension Father   . Anesthesia problems Neg Hx     Social History Social History  Substance Use Topics  . Smoking status: Never Smoker  . Smokeless tobacco: Never Used  . Alcohol use No     Allergies   Aspirin   Review of Systems Review of Systems  Unable to perform ROS: Mental status change  Constitutional: Positive for fatigue. Negative for fever.  Respiratory: Negative for shortness of breath.   Cardiovascular: Negative for chest pain.  Gastrointestinal: Positive for abdominal pain. Negative for nausea and vomiting.  Genitourinary: Positive for pelvic pain and vaginal bleeding.  Musculoskeletal: Negative for neck pain.  Skin: Negative for rash.  Neurological: Positive for weakness (generalized). Negative for numbness and headaches.     Physical Exam Updated Vital Signs BP 117/76   Pulse 93   Temp 98 F (36.7 C) (Axillary) Comment: axillary  Resp 16   Ht  (1.6 m)   Wt 110.2 kg (243 lb)   LMP 05/07/2017   SpO2 100%   Breastfeeding? Unknown Comment: had miscarriage last saturaday  BMI 43.05 kg/m   Physical Exam  Constitutional: She appears well-developed and well-nourished. She appears listless. No distress.  HENT:  Head: Normocephalic and atraumatic.  Eyes: Conjunctivae and EOM are normal.  Neck: Normal range of motion.  Cardiovascular: Normal rate, regular rhythm, normal heart sounds and intact distal pulses.  Exam reveals no gallop and no friction rub.   No murmur heard. Pulmonary/Chest: Effort normal and breath sounds normal. No respiratory distress. She has no wheezes. She has no rales.   Abdominal: Soft. She exhibits no distension. There is tenderness (bilateral lower abdomen). There is no guarding.  Genitourinary:  Genitourinary Comments: Products of conception, clots, in cervical os Significant blood in vaginal vault Removal of clots with continued hemorrhage noted from os  Musculoskeletal: She exhibits no edema or tenderness.  Neurological: She appears listless.  Generalized weakness Drift bilaterally Difficult lifting legs bilaterally   Skin: Skin is warm and dry. No rash noted. She is not diaphoretic. No erythema.  Nursing note and vitals reviewed.    ED Treatments / Results  Labs (all labs ordered are listed, but only abnormal results are displayed) Labs Reviewed  COMPREHENSIVE METABOLIC PANEL - Abnormal; Notable for the following:       Result Value   Potassium 3.4 (*)    Glucose, Bld 199 (*)    Calcium 8.7 (*)    Total Protein 6.4 (*)    All other components within normal limits  CBC - Abnormal; Notable for the following:    Hemoglobin 10.7 (*)    HCT 35.6 (*)    MCV 74.9 (*)    MCH 22.5 (*)  All other components within normal limits  ACETAMINOPHEN LEVEL - Abnormal; Notable for the following:    Acetaminophen (Tylenol), Serum <10 (*)    All other components within normal limits  CBC - Abnormal; Notable for the following:    WBC 11.1 (*)    RBC 3.73 (*)    Hemoglobin 8.4 (*)    HCT 27.9 (*)    MCV 74.8 (*)    MCH 22.5 (*)    Platelets 117 (*)    All other components within normal limits  I-STAT BETA HCG BLOOD, ED ([facility], [facility], [facility] ONLY) - Abnormal; Notable for the following:    I-stat hCG, quantitative 226.3 (*)    All other components within normal limits  I-STAT CHEM 8, ED - Abnormal; Notable for the following:    Glucose, Bld 197 (*)    Calcium, Ion 1.12 (*)    All other components within normal limits  I-STAT VENOUS BLOOD GAS, ED - Abnormal; Notable for the following:    pCO2, Ven 36.6 (*)    pO2, Ven 23.0 (*)    Acid-base deficit 3.0  (*)    All other components within normal limits  CBG MONITORING, ED - Abnormal; Notable for the following:    Glucose-Capillary 147 (*)    All other components within normal limits  ETHANOL  SALICYLATE LEVEL  TROPONIN I  URINALYSIS, ROUTINE W REFLEX MICROSCOPIC  RAPID URINE DRUG SCREEN, HOSP PERFORMED  CBC WITH DIFFERENTIAL/PLATELET  BASIC METABOLIC PANEL  PROTIME-INR  TROPONIN I  D-DIMER, QUANTITATIVE (NOT AT [facility]  I-STAT TROPONIN, ED  I-STAT CG4 LACTIC ACID, ED  I-STAT CG4 LACTIC ACID, ED  ABO/RH  TYPE AND SCREEN  PREPARE RBC (CROSSMATCH)  PREPARE RBC (CROSSMATCH)  TYPE AND SCREEN    EKG  EKG Interpretation  Date/Time:  Sunday July 19 2017 18:36:25 EDT Ventricular Rate:  85 PR Interval:    QRS Duration: 80 QT Interval:  433 QTC Calculation: 515 R Axis:   53 Text Interpretation:  Sinus rhythm Borderline T wave abnormalities Prolonged QT interval Baseline wander in lead(s) II aVR aVF V1 V4 V5 V6 Since prior ECG, prior TW changes improved, baseline wander significant Confirmed by Alvira Monday ([id number]) on 07/20/2017 3:02:36 AM       Radiology Ct Head Wo Contrast  Result Date: 07/19/2017 CLINICAL DATA:  Found unresponsive at home. EXAM: CT HEAD WITHOUT CONTRAST CT CERVICAL SPINE WITHOUT CONTRAST TECHNIQUE: Multidetector CT imaging of the head and cervical spine was performed following the standard protocol without intravenous contrast. Multiplanar CT image reconstructions of the cervical spine were also generated. COMPARISON:  None. FINDINGS: CT HEAD FINDINGS Brain: No evidence of acute infarction, hemorrhage, hydrocephalus, extra-axial collection or mass lesion/mass effect. Vascular: No hyperdense vessel or unexpected calcification. Skull: Normal. Negative for fracture or focal lesion. Sinuses/Orbits: No acute finding. Other: None. CT CERVICAL SPINE FINDINGS Alignment: Normal. Skull base and vertebrae: No acute fracture. No primary bone lesion or focal pathologic  process. Soft tissues and spinal canal: No prevertebral fluid or swelling. No visible canal hematoma. Disc levels: Anterior osteophyte formation is noted at C5-6 and C6-7. Disc spaces are otherwise well maintained. Upper chest: Negative. Other: None. IMPRESSION: Normal head CT. No significant abnormality seen in the cervical spine. Electronically Signed   By: Lupita Raider, M.D.   On: 07/19/2017 17:48   Ct Cervical Spine Wo Contrast  Result Date: 07/19/2017 CLINICAL DATA:  Found unresponsive at home. EXAM: CT HEAD WITHOUT CONTRAST CT CERVICAL SPINE WITHOUT CONTRAST TECHNIQUE:  Multidetector CT imaging of the head and cervical spine was performed following the standard protocol without intravenous contrast. Multiplanar CT image reconstructions of the cervical spine were also generated. COMPARISON:  None. FINDINGS: CT HEAD FINDINGS Brain: No evidence of acute infarction, hemorrhage, hydrocephalus, extra-axial collection or mass lesion/mass effect. Vascular: No hyperdense vessel or unexpected calcification. Skull: Normal. Negative for fracture or focal lesion. Sinuses/Orbits: No acute finding. Other: None. CT CERVICAL SPINE FINDINGS Alignment: Normal. Skull base and vertebrae: No acute fracture. No primary bone lesion or focal pathologic process. Soft tissues and spinal canal: No prevertebral fluid or swelling. No visible canal hematoma. Disc levels: Anterior osteophyte formation is noted at C5-6 and C6-7. Disc spaces are otherwise well maintained. Upper chest: Negative. Other: None. IMPRESSION: Normal head CT. No significant abnormality seen in the cervical spine. Electronically Signed   By: Lupita Raider, M.D.   On: 07/19/2017 17:48   US Ob Transvaginal  Result Date: 07/19/2017 CLINICAL DATA:  Heavy vaginal bleeding. EXAM: TRANSVAGINAL OB ULTRASOUND TECHNIQUE: Transvaginal ultrasound was performed for complete evaluation of the gestation as well as the maternal uterus, adnexal regions, and pelvic  cul-de-sac. COMPARISON:  07/11/17. FINDINGS: Intrauterine gestational sac: None Yolk sac:  Not Visualized. Embryo:  Not Visualized. Cardiac Activity: Not Visualized. Maternal uterus/adnexae: Subchorionic hemorrhage: Right ovary: Not visualized Left ovary: Not visualized Other :The endometrium appears thickened measuring 25 mm. The endocervical canal measures 3.5 cm and appears distended with heterogeneous contents. There is residual fluid within the lower uterine segment which may represent sequelae of collapsed intrauterine gestational sac. Free fluid:  None IMPRESSION: 1. No intrauterine definite gestational sac, yolk sac, or fetal pole identified. The endometrial canal and endocervical canal appear distended with heterogeneous debris. The endometrium measures up to 2.5 cm. Findings are suspicious for abortion in progress. Alternatively, in the setting of missed abortion findings would be worrisome for retained products. Electronically Signed   By: Signa Kell M.D.   On: 07/19/2017 21:06   Dg Chest Portable 1 View  Result Date: 07/20/2017 CLINICAL DATA:  Shortness of breath.  Miscarriage 1 week ago. EXAM: PORTABLE CHEST 1 VIEW COMPARISON:  01/02/2017 FINDINGS: Lungs are adequately inflated without consolidation or effusion. Cardiomediastinal silhouette, bones and soft tissues are within normal. IMPRESSION: No active disease. Electronically Signed   By: Elberta Fortis M.D.   On: 07/20/2017 01:18    Procedures Procedures (including critical care time)  Medications Ordered in ED Medications  sodium chloride 0.9 % bolus 1,000 mL (0 mLs Intravenous Stopped 07/20/17 0045)  misoprostol (CYTOTEC) tablet 800 mcg (800 mcg Rectal Given 07/19/17 2028)  0.9 %  sodium chloride infusion ( Intravenous Stopped 07/20/17 0045)  sodium chloride 0.9 % bolus 1,000 mL (0 mLs Intravenous Stopped 07/20/17 0045)  sodium chloride 0.9 % bolus 1,000 mL (0 mLs Intravenous Stopped 07/20/17 0046)  misoprostol (CYTOTEC) tablet 800  mcg (800 mcg Rectal Given 07/19/17 2218)  sodium chloride 0.9 % bolus 1,000 mL (1,000 mLs Intravenous New Bag/Given 07/20/17 0146)  0.9 %  sodium chloride infusion (10 mL/hr Intravenous New Bag/Given 07/20/17 0146)     Initial Impression / Assessment and Plan / ED Course  I have reviewed the triage vital signs and the nursing notes.  Pertinent labs & imaging results that were available during my care of the patient were reviewed by me and considered in my medical decision making (see chart for details).    41 year old female [phone] with recent diagnosis of likely miscarriage presents with concern for syncope and  vaginal bleeding.  On arrival, patient history limited, however concern for hemorrhage post spontaneous abortion by history provided by both patient and family with associated syncope.  Patient has nonspecific TW inversions however no chest pain or dyspnea and doubt PE or ACS and istat trop negative. IStat chem 8 and ABORH/type and screen thought to be ordered with hgb noted to be 12.2 on istat.  Blood pressure stable.    Regarding mental status, patient slow to respond initially but is appropriately following commands.  CT head and CSpine done given pt with bilateral/gen weakness, not clear if any trauma and shows no acute abnormalities. Pt denies SI or ingestion, however given her affect, checked tylenol, etoh, salicylate levels which were WNL    Performed pelvic exam, which showed significant hemorrhage. Products of conception removed from cervical os with some decrease in bleeding however continued active bleeding noted. Patient with 200cc in suction canister, est 500cc blood loss in ED with both bleeding noted with suction, bleeding on chucks, gauze.   Vital signs remain stable.   Suspect hemorrhagic anemia etiology of ams and syncope vs combination of vasovagal/emotional and hemorrhagic presentation.  Given concern for significant bleeding on pelvic exam, consulted OBGYN Dr. Shawnie Pons for  evaluation and transfer to [facility].  Blood pressures 140s systolic with HR in 80s consistently--after pelvic exam blood pressures decreased transiently without rise in heart rate. Blood pressures 110 systolic prior to transfer.  Discussed suspect more likely vagal episode as etiology of abrupt change in blood pressures in ED after pelvic, however called Dr. Shawnie Pons to notify of change given patient's history of bleeding and to ensure continued close monitoring on arrival.  Given persistent stability prior to this procedure and increasing BP at this time to 110s without tachycardia, feel vasovagal more likely, emergent transfusion not indicated at this time, and that patient is most appropriate for transfer to [facility] hospital for evaluation and definitive treatment of hemorrhage if indicated.   History and physical not consistent with MI, PE, CVA, dissection, sepsis, ingestion. Transferred to [facility] hospital in stable condition for evaluation of spontaneous abortion with significant hemorrhage and associated syncopal episode and fatigue.  Final Clinical Impressions(s) / ED Diagnoses   Final diagnoses:  Vaginal bleeding  Syncope, unspecified syncope type  Miscarriage  Symptomatic anemia    New Prescriptions New Prescriptions   No medications on file     Alvira Monday, MD 07/20/17 [phone]

## 2017-07-19 NOTE — ED Notes (Signed)
Patient transported to CT 

## 2017-07-19 NOTE — ED Provider Notes (Signed)
[facility]-EM[facility]Y DEPT Provider Note   CSN: [id number] Arrival date & time: 07/19/17  1548     History   Chief Complaint Chief Complaint  Patient presents with  . Altered Mental Status    HPI Kylie Baker is a 41 y.o. female.  Patient returns from [facility].  Sent there from [facility] earlier today with heavy bleeding in setting of pregnancy.  Blood transfusion started.  D/w OB Dr. Shawnie Pons.  She states patient had a syncopal episode with loss of consciousness while at [facility]. The bleeding had slowed after she received Cytotec 2. Dr. Shawnie Pons states the ultrasound shows endometrium less than 3 cm which usually responds to Cytotec. She does not feel she needs any surgical intervention. Transferred to [facility] was recommended by cardiology given patient's syncope and EKG changes. Patient denies any chest pain or shortness of breath. She continues to complain of lower abdominal pain. She states she is still having bleeding though has slowed somewhat. She does not take any blood thinners.  Patient states her lower abdominal pain and bleeding started today. She has estimated she was about [redacted] weeks pregnant. . [id number] at [redacted]w[redacted]d by LMP   The history is provided by the patient and a relative.  Altered Mental Status   Associated symptoms include weakness.    Past Medical History:  Diagnosis Date  . Asthma   . Gestational diabetes 01/25/2012  . Pregnancy induced hypertension   . Thyroid disease   . Type 2 diabetes mellitus affecting pregnancy in first trimester, antepartum 07/11/2017    Patient Active Problem List   Diagnosis Date Noted  . Type 2 diabetes mellitus affecting pregnancy in first trimester, antepartum 07/11/2017  . Goiter, nodular 07/04/2015  . Vitamin D deficiency 03/27/2015  . Unspecified essential hypertension 04/13/2014  . Hypercalcemia 02/09/2014  . Subclinical hyperthyroidism 02/09/2014  . Incarcerated incisional hernia-s/p repair 09/07/12 10/05/2012  .  Supervision of high-risk pregnancy 01/25/2012  . Gestational diabetes 01/25/2012  . Chronic hypertension complicating or reason for care during pregnancy 01/25/2012  . Asthma 01/25/2012    Past Surgical History:  Procedure Laterality Date  . APPENDECTOMY  1980's  . CESAREAN SECTION  06/06/2010  . CESAREAN SECTION  03/27/2012   Procedure: CESAREAN SECTION;  Surgeon: Kathreen Cosier, MD;  Location: [facility];  Service: Gynecology;  Laterality: N/A;  Repeat Cesarean Section Delivery   Boy  @ 0005 , Apgars 8/9  . CHOLECYSTECTOMY  04/22/2012   Procedure: LAPAROSCOPIC CHOLECYSTECTOMY WITH INTRAOPERATIVE CHOLANGIOGRAM;  Surgeon: Mariella Saa, MD;  Location: [facility] ORS;  Service: General;  Laterality: N/A;  . THYROIDECTOMY    . UMBILICAL HERNIA REPAIR  09/07/2012   Procedure: HERNIA REPAIR UMBILICAL ADULT;  Surgeon: Adolph Pollack, MD;  Location: [facility];  Service: General;  Laterality: N/A;  Repair of Incisional Incarcarated hernia    OB History    Gravida Para Term Preterm AB Living   SAB TAB Ectopic Multiple Live Births   1 0 0 0 2       Home Medications    Prior to Admission medications   Medication Sig Start Date End Date Taking? Authorizing Provider  labetalol (NORMODYNE) 200 MG tablet Take 0.5 tablets (100 mg total) by mouth 2 (two) times daily. 07/11/17  Yes Smith, [place], CNM  levothyroxine (SYNTHROID, LEVOTHROID) 125 MCG tablet Take 125 mcg by mouth daily before breakfast.   Yes [provider]  lisinopril (PRINIVIL,ZESTRIL) 20 MG tablet Take 20  mg by mouth daily.   Yes [provider]  metFORMIN (GLUCOPHAGE) 850 MG tablet Take 850 mg by mouth 2 (two) times daily with a meal.   Yes [provider]  Cholecalciferol (VITAMIN D3) 5000 UNITS CAPS Take 3 capsules by mouth daily.    [provider]  ferrous sulfate 325 (65 FE) MG tablet Take 1 tablet (325 mg total) by mouth daily. 09/26/16   Mabe, Latanya Maudlin, MD    Family  History Family History  Problem Relation Age of Onset  . Hypertension Mother   . Diabetes Father   . Asthma Father   . Hypertension Father   . Anesthesia problems Neg Hx     Social History Social History  Substance Use Topics  . Smoking status: Never Smoker  . Smokeless tobacco: Never Used  . Alcohol use No     Allergies   Aspirin   Review of Systems Review of Systems  Constitutional: Positive for activity change, appetite change and fatigue. Negative for fever.  Respiratory: Negative for cough, chest tightness and shortness of breath.   Cardiovascular: Negative for chest pain.  Gastrointestinal: Positive for abdominal pain. Negative for nausea and vomiting.  Genitourinary: Positive for vaginal bleeding. Negative for dysuria and flank pain.  Musculoskeletal: Negative for back pain and myalgias.  Skin: Positive for wound.  Neurological: Positive for dizziness, weakness and light-headedness.   all other systems are negative except as noted in the HPI and PMH.    Physical Exam Updated Vital Signs BP 121/80 (BP Location: Left Arm)   Pulse 96   Temp 98.3 F (36.8 C) (Oral)   Resp 18   Ht  (1.6 m)   Wt 110.2 kg (243 lb)   LMP 05/07/2017   SpO2 100%   Breastfeeding? Unknown   BMI 43.05 kg/m   Physical Exam  Constitutional: She is oriented to person, place, and time. She appears well-developed and well-nourished. No distress.  Fatigued. Pale appearing  HENT:  Head: Normocephalic and atraumatic.  Mouth/Throat: Oropharynx is clear and moist. No oropharyngeal exudate.  Conjunctival pallor  Eyes: Pupils are equal, round, and reactive to light. Conjunctivae and EOM are normal.  Neck: Normal range of motion. Neck supple.  No meningismus.  Cardiovascular: Normal rate, regular rhythm, normal heart sounds and intact distal pulses.   No murmur heard. Pulmonary/Chest: Effort normal and breath sounds normal. No respiratory distress.  Abdominal: Soft. There is  tenderness. There is no rebound and no guarding.  Suprapubic tenderness  Genitourinary:  Genitourinary Comments: There is blood on patient's gown and gross blood in her groin. Pelvic exam deferred it was just performed by Dr. Shawnie Pons  Musculoskeletal: Normal range of motion. She exhibits no edema or tenderness.  Neurological: She is alert and oriented to person, place, and time. No cranial nerve deficit. She exhibits normal muscle tone. Coordination normal.  Slow to respond, fatigued,  Flat affect,  Moving all extremities  Skin: Skin is warm.  Psychiatric: She has a normal mood and affect. Her behavior is normal.  Nursing note and vitals reviewed.    ED Treatments / Results  Labs (all labs ordered are listed, but only abnormal results are displayed) Labs Reviewed  COMPREHENSIVE METABOLIC PANEL - Abnormal; Notable for the following:       Result Value   Potassium 3.4 (*)    Glucose, Bld 199 (*)    Calcium 8.7 (*)    Total Protein 6.4 (*)    All other components within normal  limits  CBC - Abnormal; Notable for the following:    Hemoglobin 10.7 (*)    HCT 35.6 (*)    MCV 74.9 (*)    MCH 22.5 (*)    All other components within normal limits  ACETAMINOPHEN LEVEL - Abnormal; Notable for the following:    Acetaminophen (Tylenol), Serum <10 (*)    All other components within normal limits  CBC - Abnormal; Notable for the following:    WBC 11.1 (*)    RBC 3.73 (*)    Hemoglobin 8.4 (*)    HCT 27.9 (*)    MCV 74.8 (*)    MCH 22.5 (*)    Platelets 117 (*)    All other components within normal limits  I-STAT BETA HCG BLOOD, ED ([facility], [facility], [facility] ONLY) - Abnormal; Notable for the following:    I-stat hCG, quantitative 226.3 (*)    All other components within normal limits  I-STAT CHEM 8, ED - Abnormal; Notable for the following:    Glucose, Bld 197 (*)    Calcium, Ion 1.12 (*)    All other components within normal limits  ETHANOL  SALICYLATE LEVEL  TROPONIN I  URINALYSIS, ROUTINE W  REFLEX MICROSCOPIC  RAPID URINE DRUG SCREEN, HOSP PERFORMED  CBC WITH DIFFERENTIAL/PLATELET  BASIC METABOLIC PANEL  PROTIME-INR  TROPONIN I  D-DIMER, QUANTITATIVE (NOT AT [facility])  I-STAT TROPONIN, ED  I-STAT CG4 LACTIC ACID, ED  I-STAT VENOUS BLOOD GAS, ED  ABO/RH  TYPE AND SCREEN  PREPARE RBC (CROSSMATCH)  PREPARE RBC (CROSSMATCH)    EKG  EKG Interpretation  Date/Time:  Sunday July 19 2017 16:01:27 EDT Ventricular Rate:  87 PR Interval:    QRS Duration: 94 QT Interval:  375 QTC Calculation: 452 R Axis:   26 Text Interpretation:  Sinus rhythm Borderline T abnormalities, lateral leads Since prior EKG, nonspecific TW inversion and biphasic TW new from prior Confirmed by Alvira Monday ([id number]) on 07/19/2017 4:09:29 PM       Radiology Ct Head Wo Contrast  Result Date: 07/19/2017 CLINICAL DATA:  Found unresponsive at home. EXAM: CT HEAD WITHOUT CONTRAST CT CERVICAL SPINE WITHOUT CONTRAST TECHNIQUE: Multidetector CT imaging of the head and cervical spine was performed following the standard protocol without intravenous contrast. Multiplanar CT image reconstructions of the cervical spine were also generated. COMPARISON:  None. FINDINGS: CT HEAD FINDINGS Brain: No evidence of acute infarction, hemorrhage, hydrocephalus, extra-axial collection or mass lesion/mass effect. Vascular: No hyperdense vessel or unexpected calcification. Skull: Normal. Negative for fracture or focal lesion. Sinuses/Orbits: No acute finding. Other: None. CT CERVICAL SPINE FINDINGS Alignment: Normal. Skull base and vertebrae: No acute fracture. No primary bone lesion or focal pathologic process. Soft tissues and spinal canal: No prevertebral fluid or swelling. No visible canal hematoma. Disc levels: Anterior osteophyte formation is noted at C5-6 and C6-7. Disc spaces are otherwise well maintained. Upper chest: Negative. Other: None. IMPRESSION: Normal head CT. No significant abnormality seen in the cervical spine.  Electronically Signed   By: Lupita Raider, M.D.   On: 07/19/2017 17:48   Ct Cervical Spine Wo Contrast  Result Date: 07/19/2017 CLINICAL DATA:  Found unresponsive at home. EXAM: CT HEAD WITHOUT CONTRAST CT CERVICAL SPINE WITHOUT CONTRAST TECHNIQUE: Multidetector CT imaging of the head and cervical spine was performed following the standard protocol without intravenous contrast. Multiplanar CT image reconstructions of the cervical spine were also generated. COMPARISON:  None. FINDINGS: CT HEAD FINDINGS Brain: No evidence of acute infarction, hemorrhage, hydrocephalus, extra-axial collection  or mass lesion/mass effect. Vascular: No hyperdense vessel or unexpected calcification. Skull: Normal. Negative for fracture or focal lesion. Sinuses/Orbits: No acute finding. Other: None. CT CERVICAL SPINE FINDINGS Alignment: Normal. Skull base and vertebrae: No acute fracture. No primary bone lesion or focal pathologic process. Soft tissues and spinal canal: No prevertebral fluid or swelling. No visible canal hematoma. Disc levels: Anterior osteophyte formation is noted at C5-6 and C6-7. Disc spaces are otherwise well maintained. Upper chest: Negative. Other: None. IMPRESSION: Normal head CT. No significant abnormality seen in the cervical spine. Electronically Signed   By: Lupita Raider, M.D.   On: 07/19/2017 17:48   US Ob Transvaginal  Result Date: 07/19/2017 CLINICAL DATA:  Heavy vaginal bleeding. EXAM: TRANSVAGINAL OB ULTRASOUND TECHNIQUE: Transvaginal ultrasound was performed for complete evaluation of the gestation as well as the maternal uterus, adnexal regions, and pelvic cul-de-sac. COMPARISON:  07/11/17. FINDINGS: Intrauterine gestational sac: None Yolk sac:  Not Visualized. Embryo:  Not Visualized. Cardiac Activity: Not Visualized. Maternal uterus/adnexae: Subchorionic hemorrhage: Right ovary: Not visualized Left ovary: Not visualized Other :The endometrium appears thickened measuring 25 mm. The  endocervical canal measures 3.5 cm and appears distended with heterogeneous contents. There is residual fluid within the lower uterine segment which may represent sequelae of collapsed intrauterine gestational sac. Free fluid:  None IMPRESSION: 1. No intrauterine definite gestational sac, yolk sac, or fetal pole identified. The endometrial canal and endocervical canal appear distended with heterogeneous debris. The endometrium measures up to 2.5 cm. Findings are suspicious for abortion in progress. Alternatively, in the setting of missed abortion findings would be worrisome for retained products. Electronically Signed   By: Signa Kell M.D.   On: 07/19/2017 21:06    Procedures Procedures (including critical care time)  Medications Ordered in ED Medications  sodium chloride 0.9 % bolus 1,000 mL (not administered)  0.9 %  sodium chloride infusion (not administered)  sodium chloride 0.9 % bolus 1,000 mL (1,000 mLs Intravenous New Bag/Given 07/19/17 1640)  misoprostol (CYTOTEC) tablet 800 mcg (800 mcg Rectal Given 07/19/17 2028)  0.9 %  sodium chloride infusion ( Intravenous New Bag/Given 07/19/17 2130)  sodium chloride 0.9 % bolus 1,000 mL (1,000 mLs Intravenous New Bag/Given 07/19/17 2000)  sodium chloride 0.9 % bolus 1,000 mL (1,000 mLs Intravenous New Bag/Given 07/19/17 1930)  misoprostol (CYTOTEC) tablet 800 mcg (800 mcg Rectal Given 07/19/17 2218)     Initial Impression / Assessment and Plan / ED Course  I have reviewed the triage vital signs and the nursing notes.  Pertinent labs & imaging results that were available during my care of the patient were reviewed by me and considered in my medical decision making (see chart for details).    Patient transferred back from [facility] with extensive vaginal bleeding in setting of pregnancy. She is having a miscarriage based on ultrasound and was given Cytotec. Currently she does not need any further surgical intervention at this point as  bleeding is improving per OB. Hemoglobin has dropped 4 g and she is being transfused.  Blood pressure and heart rate remained stable in the ED. Patient appears weak and uncomfortable with lower abdominal pain. Type and crossmatch ordered for 2 units of blood and labs will be resent after transfusion performed by [facility]  She denies any chest pain or shortness of breath. Chest x-rays negative. EKG does show anterolateral T-wave inversions that have progressed from previous. This is likely due to her anemia. Second troponin will be sent. Discussed with Dr.  Turner of cardiology who agrees.  Admission discussed with Dr. Selena Batten who accepts patient to medical service. Transfusion ordered at 11 PM has still not been started due to blood from [facility] being improperly connected and saline back flowing into the bag of blood.  Repeat labs pending.    Addendum 3:45 AM. Called to bedside by nurse. Patient on bedside commode became unresponsive with blood pressure of 76 systolic. Patient still has a pulse. Patient assisted back to bed with multiple staff members. Emergency release blood ordered and IV fluids ordered. Blood pressure improved to 119 systolic and patient gradually became more responsive. On vaginal exam there is no active bleeding, os still appears to be open. Patient still has suprapubic tenderness.  Patient's blood pressure and mental status have improved. She is receiving packed red blood cells at this time. Admission to Hospital discussed with Dr. Selena Batten. Her vaginal bleeding appears to have subsided. Blood pressure and heart rate remained stable.  CRITICAL CARE Performed by: Glynn Octave Total critical care time: 60 minutes Critical care time was exclusive of separately billable procedures and treating other patients. Critical care was necessary to treat or prevent imminent or life-threatening deterioration. Critical care was time spent personally by me on the following  activities: development of treatment plan with patient and/or surrogate as well as nursing, discussions with consultants, evaluation of patient's response to treatment, examination of patient, obtaining history from patient or surrogate, ordering and performing treatments and interventions, ordering and review of laboratory studies, ordering and review of radiographic studies, pulse oximetry and re-evaluation of patient's condition.   Final Clinical Impressions(s) / ED Diagnoses   Final diagnoses:  Vaginal bleeding  Syncope, unspecified syncope type  Miscarriage    New Prescriptions New Prescriptions   No medications on file     Glynn Octave, MD 07/20/17 [phone]

## 2017-07-19 NOTE — MAU Provider Note (Signed)
Chief Complaint: Altered Mental Status   None     SUBJECTIVE HPI: Kylie Baker is a 41 y.o. [id number] at [redacted]w[redacted]d by LMP who presents to maternity admissions transferred from [facility] for heavy vaginal bleeding in early pregnancy.  She started bleeding at 1:00 this afternoon and bleeding became heavy with large clots. She presented to [facility] with syncope, was found unresponsive at home by family, but was alert and oriented in the ED with head and neck CT wnl.  She reports no vaginal bleeding since 9/22, when she was seen in MAU for spotting.  At that time she had a quantitative hcg of 1503 and US showing irregularly shaped gestational sac and no yolk sac or fetal pole.  She did not follow up with quant hcg in 48 hours as recommended at discharge.  She has hx of uncontrolled essential HTN on medications, and was started on labetalol 200 mg BID on 9/22 at her MAU visit.  She reports associated symptoms of dizziness and weakness. There are no other associated symptoms.  She denies chest pain or shortness of breath.      HPI  Past Medical History:  Diagnosis Date  . Asthma   . Gestational diabetes 01/25/2012  . Pregnancy induced hypertension   . Thyroid disease   . Type 2 diabetes mellitus affecting pregnancy in first trimester, antepartum 07/11/2017   Past Surgical History:  Procedure Laterality Date  . APPENDECTOMY  1980's  . CESAREAN SECTION  06/06/2010  . CESAREAN SECTION  03/27/2012   Procedure: CESAREAN SECTION;  Surgeon: Kathreen Cosier, MD;  Location: [facility]S;  Service: Gynecology;  Laterality: N/A;  Repeat Cesarean Section Delivery   Boy  @ 0005 , Apgars 8/9  . CHOLECYSTECTOMY  04/22/2012   Procedure: LAPAROSCOPIC CHOLECYSTECTOMY WITH INTRAOPERATIVE CHOLANGIOGRAM;  Surgeon: Mariella Saa, MD;  Location: [facility];  Service: General;  Laterality: N/A;  . THYROIDECTOMY    . UMBILICAL HERNIA REPAIR  09/07/2012   Procedure: HERNIA REPAIR UMBILICAL ADULT;  Surgeon: Adolph Pollack, MD;  Location:  [facility];  Service: General;  Laterality: N/A;  Repair of Incisional Incarcarated hernia   Social History   Social History  . Marital status: Married    Spouse name: N/A  . Number of children: N/A  . Years of education: N/A   Occupational History  . Not on file.   Social History Main Topics  . Smoking status: Never Smoker  . Smokeless tobacco: Never Used  . Alcohol use No  . Drug use: No  . Sexual activity: Yes    Birth control/ protection: None   Other Topics Concern  . Not on file   Social History Narrative  . No narrative on file   No current facility-administered medications on file prior to encounter.    Current Outpatient Prescriptions on File Prior to Encounter  Medication Sig Dispense Refill  . labetalol (NORMODYNE) 200 MG tablet Take 0.5 tablets (100 mg total) by mouth 2 (two) times daily. 60 tablet 2  . levothyroxine (SYNTHROID, LEVOTHROID) 125 MCG tablet Take 125 mcg by mouth daily before breakfast.    . metFORMIN (GLUCOPHAGE) 850 MG tablet Take 850 mg by mouth 2 (two) times daily with a meal.    . Cholecalciferol (VITAMIN D3) 5000 UNITS CAPS Take 3 capsules by mouth daily.    . ferrous sulfate 325 (65 FE) MG tablet Take 1 tablet (325 mg total) by mouth daily. 30 tablet 0   Allergies  Allergen Reactions  . Aspirin  Itching and Rash    ROS:  Review of Systems  Constitutional: Positive for fatigue. Negative for chills and fever.  Respiratory: Negative for shortness of breath.   Cardiovascular: Negative for chest pain.  Gastrointestinal: Negative for nausea and vomiting.  Genitourinary: Positive for pelvic pain and vaginal bleeding. Negative for difficulty urinating, dysuria, flank pain, vaginal discharge and vaginal pain.  Neurological: Positive for dizziness and syncope. Negative for headaches.  Psychiatric/Behavioral: Negative.      I have reviewed patient's Past Medical Hx, Surgical Hx, Family Hx, Social Hx, medications and allergies.   Physical Exam   Patient Vitals for the past 24 hrs:  BP Temp Temp src Pulse Resp SpO2 Height Weight  07/19/17 1911 131/83 98.2 F (36.8 C) Oral 83 17 100 % - -  07/19/17 1851 (!) 104/91 - - 86 18 99 % - -  07/19/17 1830 (!) 93/58 - - 94 11 99 % - -  07/19/17 1730 (!) 147/97 - - 83 13 100 % - -  07/19/17 1700 (!) 148/100 - - 84 17 100 % - -  07/19/17 1635 - 98.6 F (37 C) Oral - - - - -  07/19/17 1630 (!) 142/100 - - 87 17 99 % - -  07/19/17 1600 (!) 140/92 - - 83 (!) 23 100 % - -  07/19/17 1558 - - - - - -  (1.6 m) 243 lb (110.2 kg)  07/19/17 1556 - - - - - 98 % - -   Constitutional: Well-developed, well-nourished female in moderate distress.  HEART: Tachycardia with normal heart sounds, regular rhythm RESP: normal effort, lung sounds clear and equal bilaterally GI: Abd soft, non-tender. Pos BS x 4 MS: Extremities nontender, no edema, normal ROM Neurologic: Alert and oriented x 4.  GU: Neg CVAT.  PELVIC EXAM: Large amount dark red bleeding with large clots, large clot/POCs removed from cervix during exam and bleeding slowed initially but resumed following exam.    Bimanual exam: Cervix 1/long/high, no palpable clots in cervix, firm, anterior, neg CMT, uterus tender, nonenlarged, adnexa without tenderness, enlargement, or mass   LAB RESULTS Results for orders placed or performed during the hospital encounter of 07/19/17 (from the past 24 hour(s))  Comprehensive metabolic panel     Status: Abnormal   Collection Time: 07/19/17  4:04 PM  Result Value Ref Range   Sodium 136 135 - 145 mmol/L   Potassium 3.4 (L) 3.5 - 5.1 mmol/L   Chloride 104 101 - 111 mmol/L   CO2 25 22 - 32 mmol/L   Glucose, Bld 199 (H) 65 - 99 mg/dL   BUN 6 6 - 20 mg/dL   Creatinine, Ser 1.61 0.44 - 1.00 mg/dL   Calcium 8.7 (L) 8.9 - 10.3 mg/dL   Total Protein 6.4 (L) 6.5 - 8.1 g/dL   Albumin 3.7 3.5 - 5.0 g/dL   AST 20 15 - 41 U/L   ALT 14 14 - 54 U/L   Alkaline Phosphatase 41 38 - 126 U/L   Total Bilirubin 0.6 0.3  - 1.2 mg/dL   GFR calc non Af Amer >60 >60 mL/min   GFR calc Af Amer >60 >60 mL/min   Anion gap 7 5 - 15  CBC     Status: Abnormal   Collection Time: 07/19/17  4:04 PM  Result Value Ref Range   WBC 9.7 4.0 - 10.5 K/uL   RBC 4.75 3.87 - 5.11 MIL/uL   Hemoglobin 10.7 (L) 12.0 - 15.0 g/dL  HCT 35.6 (L) 36.0 - 46.0 %   MCV 74.9 (L) 78.0 - 100.0 fL   MCH 22.5 (L) 26.0 - 34.0 pg   MCHC 30.1 30.0 - 36.0 g/dL   RDW 16.1 09.6 - 04.5 %   Platelets 153 150 - 400 K/uL  Ethanol     Status: None   Collection Time: 07/19/17  4:04 PM  Result Value Ref Range   Alcohol, Ethyl (B) <10 <10 mg/dL  Acetaminophen level     Status: Abnormal   Collection Time: 07/19/17  4:04 PM  Result Value Ref Range   Acetaminophen (Tylenol), Serum <10 (L) 10 - 30 ug/mL  Salicylate level     Status: None   Collection Time: 07/19/17  4:04 PM  Result Value Ref Range   Salicylate Lvl <7.0 2.8 - 30.0 mg/dL  ABO/Rh     Status: None   Collection Time: 07/19/17  4:04 PM  Result Value Ref Range   ABO/RH(D) B POS    No rh immune globuloin NOT A RH IMMUNE GLOBULIN CANDIDATE, PT RH POSITIVE   I-Stat Beta hCG blood, ED ([facility], [facility], [facility] only)     Status: Abnormal   Collection Time: 07/19/17  4:10 PM  Result Value Ref Range   I-stat hCG, quantitative 226.3 (H) <5 mIU/mL   Comment 3          I-Stat Troponin, ED (not at [facility])     Status: None   Collection Time: 07/19/17  4:18 PM  Result Value Ref Range   Troponin i, poc 0.00 0.00 - 0.08 ng/mL   Comment 3          I-Stat Chem 8, ED     Status: Abnormal   Collection Time: 07/19/17  4:20 PM  Result Value Ref Range   Sodium 139 135 - 145 mmol/L   Potassium 3.8 3.5 - 5.1 mmol/L   Chloride 102 101 - 111 mmol/L   BUN 7 6 - 20 mg/dL   Creatinine, Ser 4.09 0.44 - 1.00 mg/dL   Glucose, Bld 811 (H) 65 - 99 mg/dL   Calcium, Ion 9.14 (L) 1.15 - 1.40 mmol/L   TCO2 28 22 - 32 mmol/L   Hemoglobin 12.2 12.0 - 15.0 g/dL   HCT 78.2 95.6 - 21.3 %    --/--/B POS (09/30 1604)  IMAGING Ct  Head Wo Contrast  Result Date: 07/19/2017 CLINICAL DATA:  Found unresponsive at home. EXAM: CT HEAD WITHOUT CONTRAST CT CERVICAL SPINE WITHOUT CONTRAST TECHNIQUE: Multidetector CT imaging of the head and cervical spine was performed following the standard protocol without intravenous contrast. Multiplanar CT image reconstructions of the cervical spine were also generated. COMPARISON:  None. FINDINGS: CT HEAD FINDINGS Brain: No evidence of acute infarction, hemorrhage, hydrocephalus, extra-axial collection or mass lesion/mass effect. Vascular: No hyperdense vessel or unexpected calcification. Skull: Normal. Negative for fracture or focal lesion. Sinuses/Orbits: No acute finding. Other: None. CT CERVICAL SPINE FINDINGS Alignment: Normal. Skull base and vertebrae: No acute fracture. No primary bone lesion or focal pathologic process. Soft tissues and spinal canal: No prevertebral fluid or swelling. No visible canal hematoma. Disc levels: Anterior osteophyte formation is noted at C5-6 and C6-7. Disc spaces are otherwise well maintained. Upper chest: Negative. Other: None. IMPRESSION: Normal head CT. No significant abnormality seen in the cervical spine. Electronically Signed   By: Lupita Raider, M.D.   On: 07/19/2017 17:48   Ct Cervical Spine Wo Contrast  Result Date: 07/19/2017 CLINICAL DATA:  Found unresponsive at  home. EXAM: CT HEAD WITHOUT CONTRAST CT CERVICAL SPINE WITHOUT CONTRAST TECHNIQUE: Multidetector CT imaging of the head and cervical spine was performed following the standard protocol without intravenous contrast. Multiplanar CT image reconstructions of the cervical spine were also generated. COMPARISON:  None. FINDINGS: CT HEAD FINDINGS Brain: No evidence of acute infarction, hemorrhage, hydrocephalus, extra-axial collection or mass lesion/mass effect. Vascular: No hyperdense vessel or unexpected calcification. Skull: Normal. Negative for fracture or focal lesion. Sinuses/Orbits: No acute finding.  Other: None. CT CERVICAL SPINE FINDINGS Alignment: Normal. Skull base and vertebrae: No acute fracture. No primary bone lesion or focal pathologic process. Soft tissues and spinal canal: No prevertebral fluid or swelling. No visible canal hematoma. Disc levels: Anterior osteophyte formation is noted at C5-6 and C6-7. Disc spaces are otherwise well maintained. Upper chest: Negative. Other: None. IMPRESSION: Normal head CT. No significant abnormality seen in the cervical spine. Electronically Signed   By: Lupita Raider, M.D.   On: 07/19/2017 17:48   US Ob Comp Less 14 Wks  Result Date: 07/11/2017 CLINICAL DATA:  Bleeding during pregnancy. Estimated gestational age by LMP is 9 weeks 2 days. Quantitative beta HCG is 1,503 EXAM: OBSTETRIC <14 WK [place] AND TRANSVAGINAL OB US TECHNIQUE: Both transabdominal and transvaginal ultrasound examinations were performed for complete evaluation of the gestation as well as the maternal uterus, adnexal regions, and pelvic cul-de-sac. Transvaginal technique was performed to assess early pregnancy. COMPARISON:  None. FINDINGS: Intrauterine gestational sac: A single intrauterine gestational sac is identified. The gestational sac is irregular and somewhat contracted. There is internal debris and internal swirling blood within the gestational sac. Yolk sac:  Not Visualized. Embryo:  Not Visualized. Cardiac Activity: Not Visualized. MSD: 17.5  mm   6 w   4  d Subchorionic hemorrhage:  None visualized. Maternal uterus/adnexae: Uterus is retroverted. No myometrial mass lesions identified. Both ovaries are visualized and appear normal. No abnormal adnexal masses. No free fluid in the pelvis. IMPRESSION: An abnormal appearing intrauterine gestational sac is identified. Size of gestational sac is small for clinical dates. No fetal pole or yolk sac are seen. Appearance is suspicious for abnormal gestation or loss of pregnancy in progress. An occult ectopic pregnancy is unlikely but not  entirely excluded. Followup ultrasound is recommended in 10-14 days for further evaluation. Electronically Signed   By: Burman Nieves M.D.   On: 07/11/2017 21:22   US Ob Transvaginal  Result Date: 07/11/2017 CLINICAL DATA:  Bleeding during pregnancy. Estimated gestational age by LMP is 9 weeks 2 days. Quantitative beta HCG is 1,503 EXAM: OBSTETRIC <14 WK [place] AND TRANSVAGINAL OB US TECHNIQUE: Both transabdominal and transvaginal ultrasound examinations were performed for complete evaluation of the gestation as well as the maternal uterus, adnexal regions, and pelvic cul-de-sac. Transvaginal technique was performed to assess early pregnancy. COMPARISON:  None. FINDINGS: Intrauterine gestational sac: A single intrauterine gestational sac is identified. The gestational sac is irregular and somewhat contracted. There is internal debris and internal swirling blood within the gestational sac. Yolk sac:  Not Visualized. Embryo:  Not Visualized. Cardiac Activity: Not Visualized. MSD: 17.5  mm   6 w   4  d Subchorionic hemorrhage:  None visualized. Maternal uterus/adnexae: Uterus is retroverted. No myometrial mass lesions identified. Both ovaries are visualized and appear normal. No abnormal adnexal masses. No free fluid in the pelvis. IMPRESSION: An abnormal appearing intrauterine gestational sac is identified. Size of gestational sac is small for clinical dates. No fetal pole or yolk sac are seen.  Appearance is suspicious for abnormal gestation or loss of pregnancy in progress. An occult ectopic pregnancy is unlikely but not entirely excluded. Followup ultrasound is recommended in 10-14 days for further evaluation. Electronically Signed   By: Burman Nieves M.D.   On: 07/11/2017 21:22    MAU Management/MDM: Pt alert and oriented but reporting dizziness and fatigue upon arrival in MAU. Blood loss from incomplete SAB, with quant hcg drop from 1500 on 9/22 to 226.3 today. Total blood loss measured by weight since  arrival in MAU is 700 ml. Pt arrived with two 20 g IVs so NS boluses started in both sites.  Pelvic exam and bedside vaginal US performed.  Cytotec 800 mcg placed rectally.  Bleeding slowed visually but pt reported feeling chest pain and then RN paged CNM to beside because pt became less responsive. She continued to respond with eye opening and turning her head to sound and on command but was unable to answer any questions.  Oxygen applied via nonrebreather mask. EKG repeated. VS stable.  Dr Shawnie Pons to bedside.  Cardiologist consulted to evaluate EKG with T wave abnormality.  EKG likely related to blood loss but recommend bedside echo.  Also concerns about continued somnolence with normal VS and recommend .  Bleeding reevaluated by Dr Shawnie Pons with speculum exam and now minimal after Cytotec dose.  Called [facility] and transfer accepted by Dr Jodi Mourning for further evaluation of altered mental status, neurologic vs anemia. PRBCs ordered and first unit started prior to pt transfer.   ASSESSMENT 1. Vaginal bleeding   2. Syncope, unspecified syncope type   3. Miscarriage   4. Hemorrhage pregnancy, first trimester    Plan: Transfer to [facility] for further evaluation of altered mental status Dr Jodi Mourning accepting MD    Sharen Counter Certified Nurse-Midwife 07/19/2017  7:32 PM

## 2017-07-19 NOTE — ED Notes (Signed)
Pt notified that a urine sample is needed.

## 2017-07-20 ENCOUNTER — Inpatient Hospital Stay (HOSPITAL_COMMUNITY): Payer: Medicaid Other

## 2017-07-20 ENCOUNTER — Encounter (HOSPITAL_COMMUNITY): Payer: Self-pay | Admitting: Internal Medicine

## 2017-07-20 DIAGNOSIS — R55 Syncope and collapse: Secondary | ICD-10-CM

## 2017-07-20 DIAGNOSIS — E119 Type 2 diabetes mellitus without complications: Secondary | ICD-10-CM | POA: Diagnosis present

## 2017-07-20 DIAGNOSIS — Z3A10 10 weeks gestation of pregnancy: Secondary | ICD-10-CM | POA: Diagnosis not present

## 2017-07-20 DIAGNOSIS — D649 Anemia, unspecified: Secondary | ICD-10-CM

## 2017-07-20 DIAGNOSIS — I959 Hypotension, unspecified: Secondary | ICD-10-CM | POA: Diagnosis present

## 2017-07-20 DIAGNOSIS — O031 Delayed or excessive hemorrhage following incomplete spontaneous abortion: Secondary | ICD-10-CM

## 2017-07-20 DIAGNOSIS — Z23 Encounter for immunization: Secondary | ICD-10-CM | POA: Diagnosis not present

## 2017-07-20 DIAGNOSIS — O039 Complete or unspecified spontaneous abortion without complication: Secondary | ICD-10-CM

## 2017-07-20 DIAGNOSIS — E876 Hypokalemia: Secondary | ICD-10-CM | POA: Diagnosis present

## 2017-07-20 DIAGNOSIS — Z7984 Long term (current) use of oral hypoglycemic drugs: Secondary | ICD-10-CM | POA: Diagnosis not present

## 2017-07-20 DIAGNOSIS — D62 Acute posthemorrhagic anemia: Secondary | ICD-10-CM | POA: Diagnosis present

## 2017-07-20 DIAGNOSIS — E890 Postprocedural hypothyroidism: Secondary | ICD-10-CM | POA: Diagnosis present

## 2017-07-20 DIAGNOSIS — J45909 Unspecified asthma, uncomplicated: Secondary | ICD-10-CM | POA: Diagnosis present

## 2017-07-20 DIAGNOSIS — O021 Missed abortion: Secondary | ICD-10-CM | POA: Diagnosis present

## 2017-07-20 LAB — URINALYSIS, ROUTINE W REFLEX MICROSCOPIC
BILIRUBIN URINE: NEGATIVE
Glucose, UA: NEGATIVE mg/dL
Hgb urine dipstick: NEGATIVE
KETONES UR: 5 mg/dL — AB
Leukocytes, UA: NEGATIVE
NITRITE: NEGATIVE
PH: 5 (ref 5.0–8.0)
PROTEIN: NEGATIVE mg/dL
Specific Gravity, Urine: 1.02 (ref 1.005–1.030)

## 2017-07-20 LAB — I-STAT VENOUS BLOOD GAS, ED
ACID-BASE DEFICIT: 3 mmol/L — AB (ref 0.0–2.0)
Bicarbonate: 21.6 mmol/L (ref 20.0–28.0)
O2 SAT: 39 %
PCO2 VEN: 36.6 mmHg — AB (ref 44.0–60.0)
PO2 VEN: 23 mmHg — AB (ref 32.0–45.0)
TCO2: 23 mmol/L (ref 22–32)
pH, Ven: 7.38 (ref 7.250–7.430)

## 2017-07-20 LAB — VAS US CAROTID
LCCAPDIAS: 27 cm/s
LEFT ECA DIAS: 24 cm/s
LEFT VERTEBRAL DIAS: -30 cm/s
LICADDIAS: -44 cm/s
LICAPDIAS: 19 cm/s
LICAPSYS: 85 cm/s
Left CCA dist dias: -32 cm/s
Left CCA dist sys: -101 cm/s
Left CCA prox sys: 97 cm/s
Left ICA dist sys: -87 cm/s
RCCAPDIAS: -27 cm/s
RIGHT ECA DIAS: -30 cm/s
RIGHT VERTEBRAL DIAS: -35 cm/s
Right CCA prox sys: -76 cm/s
Right cca dist sys: -121 cm/s

## 2017-07-20 LAB — CBC WITH DIFFERENTIAL/PLATELET
BASOS ABS: 0 10*3/uL (ref 0.0–0.1)
BASOS PCT: 0 %
EOS PCT: 0 %
Eosinophils Absolute: 0 10*3/uL (ref 0.0–0.7)
HEMATOCRIT: 30.3 % — AB (ref 36.0–46.0)
Hemoglobin: 9.7 g/dL — ABNORMAL LOW (ref 12.0–15.0)
Lymphocytes Relative: 28 %
Lymphs Abs: 3.6 10*3/uL (ref 0.7–4.0)
MCH: 25.1 pg — ABNORMAL LOW (ref 26.0–34.0)
MCHC: 32 g/dL (ref 30.0–36.0)
MCV: 78.3 fL (ref 78.0–100.0)
MONO ABS: 0.8 10*3/uL (ref 0.1–1.0)
MONOS PCT: 6 %
Neutro Abs: 8.4 10*3/uL — ABNORMAL HIGH (ref 1.7–7.7)
Neutrophils Relative %: 65 %
PLATELETS: 121 10*3/uL — AB (ref 150–400)
RBC: 3.87 MIL/uL (ref 3.87–5.11)
RDW: 15.3 % (ref 11.5–15.5)
WBC: 12.8 10*3/uL — ABNORMAL HIGH (ref 4.0–10.5)

## 2017-07-20 LAB — RAPID URINE DRUG SCREEN, HOSP PERFORMED
Amphetamines: NOT DETECTED
BARBITURATES: NOT DETECTED
Benzodiazepines: NOT DETECTED
COCAINE: NOT DETECTED
Opiates: NOT DETECTED
Tetrahydrocannabinol: NOT DETECTED

## 2017-07-20 LAB — ECHOCARDIOGRAM COMPLETE
HEIGHTINCHES: 63 in
Weight: 3888 oz

## 2017-07-20 LAB — PREPARE RBC (CROSSMATCH)

## 2017-07-20 LAB — BASIC METABOLIC PANEL
Anion gap: 8 (ref 5–15)
BUN: 5 mg/dL — AB (ref 6–20)
CALCIUM: 7.1 mg/dL — AB (ref 8.9–10.3)
CO2: 20 mmol/L — AB (ref 22–32)
Chloride: 109 mmol/L (ref 101–111)
Creatinine, Ser: 0.51 mg/dL (ref 0.44–1.00)
GFR calc Af Amer: >60 mL/min (ref >60)
GLUCOSE: 143 mg/dL — AB (ref 65–99)
Potassium: 3.5 mmol/L (ref 3.5–5.1)
Sodium: 137 mmol/L (ref 135–145)

## 2017-07-20 LAB — PROTIME-INR
INR: 1.17
PROTHROMBIN TIME: 14.8 s (ref 11.4–15.2)

## 2017-07-20 LAB — GLUCOSE, CAPILLARY
GLUCOSE-CAPILLARY: 97 mg/dL (ref 65–99)
Glucose-Capillary: 161 mg/dL — ABNORMAL HIGH (ref 65–99)

## 2017-07-20 LAB — I-STAT CG4 LACTIC ACID, ED: LACTIC ACID, VENOUS: 1.32 mmol/L (ref 0.5–1.9)

## 2017-07-20 LAB — D-DIMER, QUANTITATIVE (NOT AT ARMC): D DIMER QUANT: 0.97 ug{FEU}/mL — AB (ref 0.00–0.50)

## 2017-07-20 LAB — CBG MONITORING, ED: GLUCOSE-CAPILLARY: 147 mg/dL — AB (ref 65–99)

## 2017-07-20 LAB — TROPONIN I

## 2017-07-20 MED ORDER — INSULIN ASPART 100 UNIT/ML ~~LOC~~ SOLN: 0.0000 [IU] | Freq: Every day | SUBCUTANEOUS | Status: DC

## 2017-07-20 MED ORDER — INSULIN ASPART 100 UNIT/ML ~~LOC~~ SOLN
0.0000 [IU] | Freq: Three times a day (TID) | SUBCUTANEOUS | Status: DC
Start: 2017-07-20 — End: 2017-07-23
  Administered 2017-07-21 – 2017-07-23 (×2): 1 [IU] via SUBCUTANEOUS

## 2017-07-20 MED ORDER — ACETAMINOPHEN 325 MG PO TABS
650.0000 mg | ORAL_TABLET | Freq: Four times a day (QID) | ORAL | Status: DC | PRN
  Administered 2017-07-20 – 2017-07-21 (×2): 650 mg via ORAL
  Filled 2017-07-20 (×2): qty 2

## 2017-07-20 MED ORDER — ACETAMINOPHEN 650 MG RE SUPP: 650.0000 mg | Freq: Four times a day (QID) | RECTAL | Status: DC | PRN

## 2017-07-20 MED ORDER — TRAMADOL HCL 50 MG PO TABS
50.0000 mg | ORAL_TABLET | Freq: Four times a day (QID) | ORAL | Status: DC | PRN
  Administered 2017-07-20 – 2017-07-21 (×2): 50 mg via ORAL
  Filled 2017-07-20 (×2): qty 1

## 2017-07-20 MED ORDER — POTASSIUM CHLORIDE IN NACL 20-0.9 MEQ/L-% IV SOLN
INTRAVENOUS | Status: DC
  Administered 2017-07-20: 16:00:00 via INTRAVENOUS
  Filled 2017-07-20: qty 1000

## 2017-07-20 MED ORDER — LEVOTHYROXINE SODIUM 125 MCG PO TABS
125.0000 ug | ORAL_TABLET | Freq: Every day | ORAL | Status: DC
  Administered 2017-07-21 – 2017-07-23 (×3): 125 ug via ORAL
  Filled 2017-07-20 (×3): qty 1

## 2017-07-20 MED ORDER — METFORMIN HCL 850 MG PO TABS
850.0000 mg | ORAL_TABLET | Freq: Two times a day (BID) | ORAL | Status: DC
  Administered 2017-07-20 – 2017-07-23 (×5): 850 mg via ORAL
  Filled 2017-07-20 (×7): qty 1

## 2017-07-20 MED ORDER — INFLUENZA VAC SPLIT QUAD 0.5 ML IM SUSY
0.5000 mL | PREFILLED_SYRINGE | INTRAMUSCULAR | Status: AC
  Administered 2017-07-21: 0.5 mL via INTRAMUSCULAR
  Filled 2017-07-20: qty 0.5

## 2017-07-20 NOTE — ED Notes (Signed)
Pt resting on stretcher, R even and unlabored, responsive to verbal stimuli, VSS.  PRBC infusion with no noted reaction

## 2017-07-20 NOTE — ED Notes (Signed)
Labs previously collected and currently in main lab.

## 2017-07-20 NOTE — ED Notes (Signed)
Pt responsive to verbal stimuli, lights dimmed for patient, call bell within reach.

## 2017-07-20 NOTE — H&P (Signed)
TRH H&P   Patient Demographics:    Kylie Baker, is a 41 y.o. female  MRN: [id number]   DOB - [birth year]  Admit Date - 07/19/2017  Outpatient Primary MD for the patient is Patient, No Pcp Per   Dr. Chauncey Fischer  Referring MD/NP/PA:  Rancour   Outpatient Specialists:      Patient coming from:home=> ob-gyn=>   Chief Complaint  Patient presents with  . Altered Mental Status      HPI:    Kylie Baker  is a 41 y.o. female, w dm2, asthma apparently had vaginal bleeding and was sent to [facility] ER and pt was seen by Ob-gyn and was given something to stop the bleeding, which is most likely from miscarriage. Pt was sent back to [facility] because she passed out at [facility].  "a few minutes"  In total she passed out 3 times. Once at home, once at [facility] and then at [facility] ER. Pt was sent back to [facility] ED for evaluation of syncope.   In ED, Hgb 12.2=> 8.4,  Plt 117,  CT brain  => normal head,  Ekg:  nsr at 80, nl axis, t inversion in v4-6.  Trop negative,  Pt will be admitted for syncope, likely secondary to symptomatic anemia secondary to vaginal bleeding from miscarriage.     Review of systems:    In addition to the HPI above,  No Fever-chills, No Headache, No changes with Vision or hearing, No problems swallowing food or Liquids, No Chest pain, Cough or Shortness of Breath, No Abdominal pain, No Nausea or Vommitting, Bowel movements are regular, No Blood in stool or Urine, No dysuria, No new skin rashes or bruises, No new joints pains-aches,  No new weakness, tingling, numbness in any extremity, No recent weight gain or loss, No polyuria, polydypsia or polyphagia, No significant Mental Stressors.  A full 10 point Review of Systems was done, except as stated above, all other Review of Systems were negative.   With Past History of the  following :    Past Medical History:  Diagnosis Date  . Asthma   . Gestational diabetes 01/25/2012  . Pregnancy induced hypertension   . Thyroid disease   . Type 2 diabetes mellitus affecting pregnancy in first trimester, antepartum 07/11/2017      Past Surgical History:  Procedure Laterality Date  . APPENDECTOMY  1980's  . CESAREAN SECTION  06/06/2010  . CESAREAN SECTION  03/27/2012   Procedure: CESAREAN SECTION;  Surgeon: Kathreen Cosier, MD;  Location: [facility];  Service: Gynecology;  Laterality: N/A;  Repeat Cesarean Section Delivery   Boy  @ 0005 , Apgars 8/9  . CHOLECYSTECTOMY  04/22/2012   Procedure: LAPAROSCOPIC CHOLECYSTECTOMY WITH INTRAOPERATIVE CHOLANGIOGRAM;  Surgeon: Mariella Saa, MD;  Location: [facility];  Service: General;  Laterality: N/A;  . THYROIDECTOMY    .  UMBILICAL HERNIA REPAIR  09/07/2012   Procedure: HERNIA REPAIR UMBILICAL ADULT;  Surgeon: Adolph Pollack, MD;  Location: [facility];  Service: General;  Laterality: N/A;  Repair of Incisional Incarcarated hernia      Social History:     Social History  Substance Use Topics  . Smoking status: Never Smoker  . Smokeless tobacco: Never Used  . Alcohol use No     Lives - at home  Mobility - walks by self   Family History :     Family History  Problem Relation Age of Onset  . Hypertension Mother   . Diabetes Father   . Asthma Father   . Hypertension Father   . Anesthesia problems Neg Hx       Home Medications:   Prior to Admission medications   Medication Sig Start Date End Date Taking? Authorizing Provider  labetalol (NORMODYNE) 200 MG tablet Take 0.5 tablets (100 mg total) by mouth 2 (two) times daily. 07/11/17  Yes Smith, [place], CNM  levothyroxine (SYNTHROID, LEVOTHROID) 125 MCG tablet Take 125 mcg by mouth daily before breakfast.   Yes [provider]  lisinopril (PRINIVIL,ZESTRIL) 20 MG tablet Take 20 mg by mouth daily.   Yes [provider]  metFORMIN (GLUCOPHAGE) 850  MG tablet Take 850 mg by mouth 2 (two) times daily with a meal.   Yes [provider]  Cholecalciferol (VITAMIN D3) 5000 UNITS CAPS Take 3 capsules by mouth daily.    [provider]  ferrous sulfate 325 (65 FE) MG tablet Take 1 tablet (325 mg total) by mouth daily. 09/26/16   Mabe, Latanya Maudlin, MD     Allergies:     Allergies  Allergen Reactions  . Aspirin Itching and Rash     Physical Exam:   Vitals  Blood pressure 116/83, pulse 91, temperature 98.3 F (36.8 C), temperature source Oral, resp. rate 18, height  (1.6 m), weight 110.2 kg (243 lb), last menstrual period 05/07/2017, SpO2 100 %, unknown if currently breastfeeding.   1. General  lying in bed in NAD,   2. Normal affect and insight, Not Suicidal or Homicidal, Awake Alert, Oriented X 3.  3. No F.N deficits, ALL C.Nerves Intact, Strength 5/5 all 4 extremities, Sensation intact all 4 extremities, Plantars down going.  4. Ears and Eyes appear Normal, Conjunctivae clear, PERRLA. Moist Oral Mucosa.  5. Supple Neck, No JVD, No cervical lymphadenopathy appriciated, No Carotid Bruits.  6. Symmetrical Chest wall movement, Good air movement bilaterally, CTAB.  7. RRR, No Gallops, Rubs or Murmurs, No Parasternal Heave.  8. Positive Bowel Sounds, Abdomen Soft, No tenderness, No organomegaly appriciated,No rebound -guarding or rigidity.  9.  No Cyanosis, Normal Skin Turgor, No Skin Rash or Bruise.  10. Good muscle tone,  joints appear normal , no effusions, Normal ROM.  11. No Palpable Lymph Nodes in Neck or Axillae     Data Review:    CBC  Recent Labs Lab 07/19/17 1604 07/19/17 1620 07/19/17 1953  WBC 9.7  --  11.1*  HGB 10.7* 12.2 8.4*  HCT 35.6* 36.0 27.9*  PLT 153  --  117*  MCV 74.9*  --  74.8*  MCH 22.5*  --  22.5*  MCHC 30.1  --  30.1  RDW 14.7  --  15.1   ------------------------------------------------------------------------------------------------------------------  Chemistries    Recent Labs Lab 07/19/17 1604 07/19/17 1620  NA 136 139  K 3.4* 3.8  CL 104 102  CO2 25  --  GLUCOSE 199* 197*  BUN 6 7  CREATININE 0.68 0.70  CALCIUM 8.7*  --   AST 20  --   ALT 14  --   ALKPHOS 41  --   BILITOT 0.6  --    ------------------------------------------------------------------------------------------------------------------ estimated creatinine clearance is 109.2 mL/min (by C-G formula based on SCr of 0.7 mg/dL). ------------------------------------------------------------------------------------------------------------------ No results for input(s): TSH, T4TOTAL, T3FREE, THYROIDAB in the last 72 hours.  Invalid input(s): FREET3  Coagulation profile No results for input(s): INR, PROTIME in the last 168 hours. ------------------------------------------------------------------------------------------------------------------- No results for input(s): DDIMER in the last 72 hours. -------------------------------------------------------------------------------------------------------------------  Cardiac Enzymes  Recent Labs Lab 07/19/17 2113  TROPONINI <0.03   ------------------------------------------------------------------------------------------------------------------ No results found for: BNP   ---------------------------------------------------------------------------------------------------------------  Urinalysis    Component Value Date/Time   COLORURINE YELLOW 07/11/2017 1736   APPEARANCEUR HAZY (A) 07/11/2017 1736   LABSPEC 1.027 07/11/2017 1736   PHURINE 5.0 07/11/2017 1736   GLUCOSEU NEGATIVE 07/11/2017 1736   HGBUR NEGATIVE 07/11/2017 1736   BILIRUBINUR NEGATIVE 07/11/2017 1736   KETONESUR NEGATIVE 07/11/2017 1736   PROTEINUR 30 (A) 07/11/2017 1736   UROBILINOGEN 0.2 09/07/2012 0108   NITRITE NEGATIVE 07/11/2017 1736   LEUKOCYTESUR NEGATIVE 07/11/2017 1736     ----------------------------------------------------------------------------------------------------------------   Imaging Results:    Ct Head Wo Contrast  Result Date: 07/19/2017 CLINICAL DATA:  Found unresponsive at home. EXAM: CT HEAD WITHOUT CONTRAST CT CERVICAL SPINE WITHOUT CONTRAST TECHNIQUE: Multidetector CT imaging of the head and cervical spine was performed following the standard protocol without intravenous contrast. Multiplanar CT image reconstructions of the cervical spine were also generated. COMPARISON:  None. FINDINGS: CT HEAD FINDINGS Brain: No evidence of acute infarction, hemorrhage, hydrocephalus, extra-axial collection or mass lesion/mass effect. Vascular: No hyperdense vessel or unexpected calcification. Skull: Normal. Negative for fracture or focal lesion. Sinuses/Orbits: No acute finding. Other: None. CT CERVICAL SPINE FINDINGS Alignment: Normal. Skull base and vertebrae: No acute fracture. No primary bone lesion or focal pathologic process. Soft tissues and spinal canal: No prevertebral fluid or swelling. No visible canal hematoma. Disc levels: Anterior osteophyte formation is noted at C5-6 and C6-7. Disc spaces are otherwise well maintained. Upper chest: Negative. Other: None. IMPRESSION: Normal head CT. No significant abnormality seen in the cervical spine. Electronically Signed   By: Lupita Raider, M.D.   On: 07/19/2017 17:48   Ct Cervical Spine Wo Contrast  Result Date: 07/19/2017 CLINICAL DATA:  Found unresponsive at home. EXAM: CT HEAD WITHOUT CONTRAST CT CERVICAL SPINE WITHOUT CONTRAST TECHNIQUE: Multidetector CT imaging of the head and cervical spine was performed following the standard protocol without intravenous contrast. Multiplanar CT image reconstructions of the cervical spine were also generated. COMPARISON:  None. FINDINGS: CT HEAD FINDINGS Brain: No evidence of acute infarction, hemorrhage, hydrocephalus, extra-axial collection or mass lesion/mass effect.  Vascular: No hyperdense vessel or unexpected calcification. Skull: Normal. Negative for fracture or focal lesion. Sinuses/Orbits: No acute finding. Other: None. CT CERVICAL SPINE FINDINGS Alignment: Normal. Skull base and vertebrae: No acute fracture. No primary bone lesion or focal pathologic process. Soft tissues and spinal canal: No prevertebral fluid or swelling. No visible canal hematoma. Disc levels: Anterior osteophyte formation is noted at C5-6 and C6-7. Disc spaces are otherwise well maintained. Upper chest: Negative. Other: None. IMPRESSION: Normal head CT. No significant abnormality seen in the cervical spine. Electronically Signed   By: Lupita Raider, M.D.   On: 07/19/2017 17:48   US Ob Transvaginal  Result Date: 07/19/2017 CLINICAL DATA:  Heavy vaginal  bleeding. EXAM: TRANSVAGINAL OB ULTRASOUND TECHNIQUE: Transvaginal ultrasound was performed for complete evaluation of the gestation as well as the maternal uterus, adnexal regions, and pelvic cul-de-sac. COMPARISON:  07/11/17. FINDINGS: Intrauterine gestational sac: None Yolk sac:  Not Visualized. Embryo:  Not Visualized. Cardiac Activity: Not Visualized. Maternal uterus/adnexae: Subchorionic hemorrhage: Right ovary: Not visualized Left ovary: Not visualized Other :The endometrium appears thickened measuring 25 mm. The endocervical canal measures 3.5 cm and appears distended with heterogeneous contents. There is residual fluid within the lower uterine segment which may represent sequelae of collapsed intrauterine gestational sac. Free fluid:  None IMPRESSION: 1. No intrauterine definite gestational sac, yolk sac, or fetal pole identified. The endometrial canal and endocervical canal appear distended with heterogeneous debris. The endometrium measures up to 2.5 cm. Findings are suspicious for abortion in progress. Alternatively, in the setting of missed abortion findings would be worrisome for retained products. Electronically Signed   By: Signa Kell M.D.   On: 07/19/2017 21:06      Assessment & Plan:    Principal Problem:   Syncope Active Problems:   Symptomatic anemia   Miscarriage  Syncope Tele Trop I q6h x3 Check orthostatic bp Check carotid ultrasound  Check cardiac echo Cardiology consulted by email  Symptomatic anemia Pt ordered blood transfusion by ED.  Check cbc after transfusion  Miscarriage/Vaginal bleeding,  Appreciate ob-gyn input  Hypokalemia repeleted and resolved  Dm2 fsbs ac and qhs, Iss  DVT Prophylaxis  - SCDs  AM Labs Ordered, also please review Full Orders  Family Communication: Admission, patients condition and plan of care including tests being ordered have been discussed with the patient  who indicate understanding and agree with the plan and Code Status.  Code Status FULL CODE  Likely DC to  home  Condition GUARDED    Consults called: cardiology by email, Gyn by ED  Admission status: inpatient  Time spent in minutes : 45    Pearson Grippe M.D on 07/20/2017 at 12:52 AM  Between 7am to 7pm - Pager - [phone]. After 7pm go to [email or web address] - password [email or web address]  [facility] - Office  [phone]

## 2017-07-20 NOTE — ED Notes (Signed)
PT back in room at this time.

## 2017-07-20 NOTE — ED Notes (Signed)
Pt w/ multiple labs that need to be drawn.  Spoke w/ Dr. Manus Gunning, as we do not know how much of the first unit of PRBC's pt received d/t the mix of blood and saline from the saline not being locked off, will wait until 0230 obtain lab work then initiate second unit of PRBC's.

## 2017-07-20 NOTE — ED Notes (Signed)
Pt transported to 2D echo

## 2017-07-20 NOTE — ED Notes (Signed)
Placed PureWick catheter per Karen (RN) 

## 2017-07-20 NOTE — ED Notes (Signed)
Full liquid diet ordered.

## 2017-07-20 NOTE — Progress Notes (Signed)
*  PRELIMINARY RESULTS* Vascular Ultrasound Carotid Duplex (Doppler) has been completed.  Preliminary findings: Bilateral 40-59% ICA stenosis, antegrade vertebral flow.     Kylie Baker Kylie Baker 07/20/2017, 3:10 PM

## 2017-07-20 NOTE — ED Notes (Signed)
C/o headache-- given tylenol -- also c/o cramping in lower abd-- scant bleeding at present

## 2017-07-20 NOTE — ED Notes (Addendum)
Pt up to bedside commode with minimal 1 person assist, pt reports no dizziness, while on commode pt had complete syncopal episode with BP of 76/51. Dr. Manus Gunning and several staff members assisted pt to bed, pt minimally responsive to repeated verbal stimuli.  Manual vaginal exam done by MD, no active bleeding noted, few dark clots noted.

## 2017-07-20 NOTE — Consult Note (Signed)
Cardiology Consultation:   Patient ID: Kylie Baker; [id number]; 1976/08/26   Admit date: 07/19/2017 Date of Consult: 07/20/2017  Primary Care Provider: Patient, No Pcp Per Primary Cardiologist: New/Nishan Primary Electrophysiologist:  None   Patient Profile:   Kylie Baker is a 41 y.o. female with a hx of type 2 DM admitted with anemia and miscarriage who is being seen today for the evaluation of syncope  at the request of Dr Benjamine Mola.  History of Present Illness:   Ms. Borys 41 y.o. about [redacted] weeks pregnant with miscarriage and subsequent abdominal pain and anemia with bleeding. Seen by Dr Shawnie Pons at [facility] and received cytotec Indicates passing out but this is in setting of feeling weak and nauseated with abdominal pain and active vaginal bleeding Hct down to 27.9 improved with transfusion Episode last night with decreased responsiveness noted BP 79 mmHg and MS responded to hydration and transfusion No chest pain no heart history no palpitations Originally from [place] and lived in [place]. Has healthy 5 and 42 yo's at home   Past Medical History:  Diagnosis Date  . Asthma   . Gestational diabetes 01/25/2012  . Pregnancy induced hypertension   . Thyroid disease   . Type 2 diabetes mellitus affecting pregnancy in first trimester, antepartum 07/11/2017    Past Surgical History:  Procedure Laterality Date  . APPENDECTOMY  1980's  . CESAREAN SECTION  06/06/2010  . CESAREAN SECTION  03/27/2012   Procedure: CESAREAN SECTION;  Surgeon: Kathreen Cosier, MD;  Location: [facility];  Service: Gynecology;  Laterality: N/A;  Repeat Cesarean Section Delivery   Boy  @ 0005 , Apgars 8/9  . CHOLECYSTECTOMY  04/22/2012   Procedure: LAPAROSCOPIC CHOLECYSTECTOMY WITH INTRAOPERATIVE CHOLANGIOGRAM;  Surgeon: Mariella Saa, MD;  Location: [facility];  Service: General;  Laterality: N/A;  . THYROIDECTOMY    . UMBILICAL HERNIA REPAIR  09/07/2012   Procedure: HERNIA REPAIR UMBILICAL ADULT;  Surgeon: Adolph Pollack, MD;  Location: [facility];  Service: General;  Laterality: N/A;  Repair of Incisional Incarcarated hernia     Home Medications:  Prior to Admission medications   Medication Sig Start Date End Date Taking? Authorizing Provider  ferrous sulfate 325 (65 FE) MG tablet Take 1 tablet (325 mg total) by mouth daily. 09/26/16  Yes Mabe, Latanya Maudlin, MD  levothyroxine (SYNTHROID, LEVOTHROID) 125 MCG tablet Take 125 mcg by mouth daily before breakfast.   Yes [provider]  lisinopril (PRINIVIL,ZESTRIL) 20 MG tablet Take 20 mg by mouth daily.   Yes [provider]  metFORMIN (GLUCOPHAGE) 850 MG tablet Take 850 mg by mouth 2 (two) times daily with a meal.   Yes [provider]    Inpatient Medications: Scheduled Meds:  Continuous Infusions:  PRN Meds:   Allergies:    Allergies  Allergen Reactions  . Aspirin Itching and Rash    Social History:   Social History   Social History  . Marital status: Married    Spouse name: N/A  . Number of children: N/A  . Years of education: N/A   Occupational History  . Not on file.   Social History Main Topics  . Smoking status: Never Smoker  . Smokeless tobacco: Never Used  . Alcohol use No  . Drug use: No  . Sexual activity: Yes    Birth control/ protection: None   Other Topics Concern  . Not on file   Social History Narrative  . No narrative on file    Family  History:    Family History  Problem Relation Age of Onset  . Hypertension Mother   . Diabetes Father   . Asthma Father   . Hypertension Father   . Anesthesia problems Neg Hx      ROS:  Please see the history of present illness.  ROS  All other ROS reviewed and negative.     Physical Exam/Data:   Vitals:   07/20/17 0615 07/20/17 0630 07/20/17 0645 07/20/17 0755  BP: (!) 124/94 118/90 129/83 131/82  Pulse: 97 84 85 83  Resp: Temp:      TempSrc:      SpO2: 100% 100% 100% 100%  Weight:      Height:         Intake/Output Summary (Last 24 hours) at 07/20/17 1053 Last data filed at 07/20/17 0535  Gross per 24 hour  Intake             5045 ml  Output              600 ml  Net             4445 ml   Filed Weights   07/19/17 1558  Weight: 243 lb (110.2 kg)   Body mass index is 43.05 kg/m.  Obese black female  HEENT: normal Lymph: no adenopathy Neck: no JVD Endocrine:  No thryomegaly Vascular: No carotid bruits; FA pulses 2+ bilaterally without bruits  Cardiac:  normal S1, S2; RRR; no murmur   Lungs:  clear to auscultation bilaterally, no wheezing, rhonchi or rales  Abd: distended diffusely tender no rebound   Ext: no edema Musculoskeletal:  No deformities, BUE and BLE strength normal and equal Skin: warm and dry  Neuro:  CNs 2-12 intact, no focal abnormalities noted Psych:  Normal affect   EKG:  The EKG was personally reviewed and demonstrates:  NSR nonspecific lateral ST changes  Telemetry:  Telemetry was personally reviewed and demonstrates:  NSR no arrhythmia  Relevant CV Studies: Echo pending  Laboratory Data:  Chemistry Recent Labs Lab 07/19/17 1604 07/19/17 1620 07/20/17 0313  NA 136 139 137  K 3.4* 3.8 3.5  CL 104 102 109  CO2 25  --  20*  GLUCOSE 199* 197* 143*  BUN 6 7 5*  CREATININE 0.68 0.70 0.51  CALCIUM 8.7*  --  7.1*  GFRNONAA >60  --  >60  GFRAA >60  --  >60  ANIONGAP 7  --  8     Recent Labs Lab 07/19/17 1604  PROT 6.4*  ALBUMIN 3.7  AST 20  ALT 14  ALKPHOS 41  BILITOT 0.6   Hematology Recent Labs Lab 07/19/17 1604 07/19/17 1620 07/19/17 1953 07/20/17 0950  WBC 9.7  --  11.1* 12.8*  RBC 4.75  --  3.73* 3.87  HGB 10.7* 12.2 8.4* 9.7*  HCT 35.6* 36.0 27.9* 30.3*  MCV 74.9*  --  74.8* 78.3  MCH 22.5*  --  22.5* 25.1*  MCHC 30.1  --  30.1 32.0  RDW 14.7  --  15.1 15.3  PLT 153  --  117* 121*   Cardiac Enzymes Recent Labs Lab 07/19/17 2113 07/20/17 0313  TROPONINI <0.03 <0.03    Recent Labs Lab 07/19/17 1618  TROPIPOC  0.00    BNPNo results for input(s): BNP, PROBNP in the last 168 hours.  DDimer  Recent Labs Lab 07/20/17 0313  DDIMER 0.97*    Radiology/Studies:  Ct Head Wo Contrast  Result  Date: 07/19/2017 CLINICAL DATA:  Found unresponsive at home. EXAM: CT HEAD WITHOUT CONTRAST CT CERVICAL SPINE WITHOUT CONTRAST TECHNIQUE: Multidetector CT imaging of the head and cervical spine was performed following the standard protocol without intravenous contrast. Multiplanar CT image reconstructions of the cervical spine were also generated. COMPARISON:  None. FINDINGS: CT HEAD FINDINGS Brain: No evidence of acute infarction, hemorrhage, hydrocephalus, extra-axial collection or mass lesion/mass effect. Vascular: No hyperdense vessel or unexpected calcification. Skull: Normal. Negative for fracture or focal lesion. Sinuses/Orbits: No acute finding. Other: None. CT CERVICAL SPINE FINDINGS Alignment: Normal. Skull base and vertebrae: No acute fracture. No primary bone lesion or focal pathologic process. Soft tissues and spinal canal: No prevertebral fluid or swelling. No visible canal hematoma. Disc levels: Anterior osteophyte formation is noted at C5-6 and C6-7. Disc spaces are otherwise well maintained. Upper chest: Negative. Other: None. IMPRESSION: Normal head CT. No significant abnormality seen in the cervical spine. Electronically Signed   By: Lupita Raider, M.D.   On: 07/19/2017 17:48   Ct Cervical Spine Wo Contrast  Result Date: 07/19/2017 CLINICAL DATA:  Found unresponsive at home. EXAM: CT HEAD WITHOUT CONTRAST CT CERVICAL SPINE WITHOUT CONTRAST TECHNIQUE: Multidetector CT imaging of the head and cervical spine was performed following the standard protocol without intravenous contrast. Multiplanar CT image reconstructions of the cervical spine were also generated. COMPARISON:  None. FINDINGS: CT HEAD FINDINGS Brain: No evidence of acute infarction, hemorrhage, hydrocephalus, extra-axial collection or mass  lesion/mass effect. Vascular: No hyperdense vessel or unexpected calcification. Skull: Normal. Negative for fracture or focal lesion. Sinuses/Orbits: No acute finding. Other: None. CT CERVICAL SPINE FINDINGS Alignment: Normal. Skull base and vertebrae: No acute fracture. No primary bone lesion or focal pathologic process. Soft tissues and spinal canal: No prevertebral fluid or swelling. No visible canal hematoma. Disc levels: Anterior osteophyte formation is noted at C5-6 and C6-7. Disc spaces are otherwise well maintained. Upper chest: Negative. Other: None. IMPRESSION: Normal head CT. No significant abnormality seen in the cervical spine. Electronically Signed   By: Lupita Raider, M.D.   On: 07/19/2017 17:48   US Ob Transvaginal  Result Date: 07/19/2017 CLINICAL DATA:  Heavy vaginal bleeding. EXAM: TRANSVAGINAL OB ULTRASOUND TECHNIQUE: Transvaginal ultrasound was performed for complete evaluation of the gestation as well as the maternal uterus, adnexal regions, and pelvic cul-de-sac. COMPARISON:  07/11/17. FINDINGS: Intrauterine gestational sac: None Yolk sac:  Not Visualized. Embryo:  Not Visualized. Cardiac Activity: Not Visualized. Maternal uterus/adnexae: Subchorionic hemorrhage: Right ovary: Not visualized Left ovary: Not visualized Other :The endometrium appears thickened measuring 25 mm. The endocervical canal measures 3.5 cm and appears distended with heterogeneous contents. There is residual fluid within the lower uterine segment which may represent sequelae of collapsed intrauterine gestational sac. Free fluid:  None IMPRESSION: 1. No intrauterine definite gestational sac, yolk sac, or fetal pole identified. The endometrial canal and endocervical canal appear distended with heterogeneous debris. The endometrium measures up to 2.5 cm. Findings are suspicious for abortion in progress. Alternatively, in the setting of missed abortion findings would be worrisome for retained products. Electronically  Signed   By: Signa Kell M.D.   On: 07/19/2017 21:06   Dg Chest Portable 1 View  Result Date: 07/20/2017 CLINICAL DATA:  Shortness of breath.  Miscarriage 1 week ago. EXAM: PORTABLE CHEST 1 VIEW COMPARISON:  01/02/2017 FINDINGS: Lungs are adequately inflated without consolidation or effusion. Cardiomediastinal silhouette, bones and soft tissues are within normal. IMPRESSION: No active disease. Electronically Signed   By:  Elberta Fortis M.D.   On: 07/20/2017 01:18    Assessment and Plan:   1. "Syncope"  No evidence of cardiac etiology especially with documented episode hypotension with MS changes associated with low BP and anemia. Exam bening ECG with non specific ST changes r/o will order echo If normal no further cardiac w/u indicated 2. Anemia: post transfusion improved. ? Need for f/u US may need D &C for miscarriage  3. Thyroid:  On replacement TSH normal 4. DM:  She is NPO metformin being held     For questions or updates, please contact [facility] Please consult [email or web address] for contact info under Cardiology/STEMI.   Signed, Charlton Haws, MD  07/20/2017 10:53 AM

## 2017-07-20 NOTE — ED Notes (Addendum)
Upon inspection of blood tubing, this writer noted that the saline had not been locked via the Y-set tubing.  Blood noted to be in bag of saline.  Unit of blood had approximately 200 ml's however infusion turned off as it had reached the 4 hour window.  Dr. Manus Gunning is aware.    Also pt had multiple NS infusions charted w/o stop time.  Stopped infusions however cannot attest that pt did receive this as they were not present upon assessment.

## 2017-07-20 NOTE — Progress Notes (Signed)
Patient arrived on floor @ 1850; patient given CHG bath; patient alert & oriented; report given to on coming RN 

## 2017-07-20 NOTE — Progress Notes (Signed)
  Echocardiogram 2D Echocardiogram has been performed.  [name] 07/20/2017, 2:53 PM

## 2017-07-20 NOTE — Progress Notes (Signed)
Patient admitted after midnight, please see H&P.  Per nursing patient's vaginal bleeding has slowed.  Echo pending.  H/H stable.  Per Gyn, no need for further intervention re: miscarriage.   Watch WBC/temperature closely  Marlin Canary DO

## 2017-07-21 ENCOUNTER — Inpatient Hospital Stay (HOSPITAL_COMMUNITY): Payer: Medicaid Other

## 2017-07-21 ENCOUNTER — Other Ambulatory Visit (HOSPITAL_COMMUNITY): Payer: No Typology Code available for payment source

## 2017-07-21 DIAGNOSIS — N939 Abnormal uterine and vaginal bleeding, unspecified: Secondary | ICD-10-CM

## 2017-07-21 DIAGNOSIS — D649 Anemia, unspecified: Secondary | ICD-10-CM

## 2017-07-21 LAB — CBC
HCT: 26 % — ABNORMAL LOW (ref 36.0–46.0)
Hemoglobin: 8.3 g/dL — ABNORMAL LOW (ref 12.0–15.0)
MCH: 24.8 pg — ABNORMAL LOW (ref 26.0–34.0)
MCHC: 31.9 g/dL (ref 30.0–36.0)
MCV: 77.6 fL — ABNORMAL LOW (ref 78.0–100.0)
PLATELETS: 96 10*3/uL — AB (ref 150–400)
RBC: 3.35 MIL/uL — AB (ref 3.87–5.11)
RDW: 15.3 % (ref 11.5–15.5)
WBC: 8.5 10*3/uL (ref 4.0–10.5)

## 2017-07-21 LAB — GLUCOSE, CAPILLARY
GLUCOSE-CAPILLARY: 92 mg/dL (ref 65–99)
Glucose-Capillary: 112 mg/dL — ABNORMAL HIGH (ref 65–99)
Glucose-Capillary: 144 mg/dL — ABNORMAL HIGH (ref 65–99)

## 2017-07-21 LAB — BLOOD PRODUCT ORDER (VERBAL) VERIFICATION

## 2017-07-21 LAB — BASIC METABOLIC PANEL
ANION GAP: 5 (ref 5–15)
CO2: 25 mmol/L (ref 22–32)
Calcium: 7.2 mg/dL — ABNORMAL LOW (ref 8.9–10.3)
Chloride: 109 mmol/L (ref 101–111)
Creatinine, Ser: 0.53 mg/dL (ref 0.44–1.00)
GLUCOSE: 126 mg/dL — AB (ref 65–99)
Potassium: 3.1 mmol/L — ABNORMAL LOW (ref 3.5–5.1)
SODIUM: 139 mmol/L (ref 135–145)

## 2017-07-21 LAB — DIC (DISSEMINATED INTRAVASCULAR COAGULATION) PANEL
D DIMER QUANT: 0.4 ug{FEU}/mL (ref 0.00–0.50)
FIBRINOGEN: 345 mg/dL (ref 210–475)
INR: 0.99
PLATELETS: 107 10*3/uL — AB (ref 150–400)
SMEAR REVIEW: NONE SEEN

## 2017-07-21 LAB — DIC (DISSEMINATED INTRAVASCULAR COAGULATION)PANEL
Prothrombin Time: 13 seconds (ref 11.4–15.2)
aPTT: 31 seconds (ref 24–36)

## 2017-07-21 LAB — PREPARE RBC (CROSSMATCH)

## 2017-07-21 LAB — HCG, QUANTITATIVE, PREGNANCY: hCG, Beta Chain, Quant, S: 167 m[IU]/mL — ABNORMAL HIGH (ref <5)

## 2017-07-21 LAB — HEMOGLOBIN AND HEMATOCRIT, BLOOD
HCT: 29.7 % — ABNORMAL LOW (ref 36.0–46.0)
Hemoglobin: 9.5 g/dL — ABNORMAL LOW (ref 12.0–15.0)

## 2017-07-21 MED ORDER — METHYLERGONOVINE MALEATE 0.2 MG PO TABS
0.2000 mg | ORAL_TABLET | Freq: Four times a day (QID) | ORAL | Status: DC
  Administered 2017-07-21 – 2017-07-22 (×4): 0.2 mg via ORAL
  Filled 2017-07-21 (×5): qty 1

## 2017-07-21 MED ORDER — HYDROCODONE-ACETAMINOPHEN 5-325 MG PO TABS
1.0000 | ORAL_TABLET | ORAL | Status: DC | PRN
  Administered 2017-07-21 – 2017-07-22 (×3): 1 via ORAL
  Filled 2017-07-21: qty 2
  Filled 2017-07-21 (×2): qty 1

## 2017-07-21 MED ORDER — CYCLOBENZAPRINE HCL 10 MG PO TABS
10.0000 mg | ORAL_TABLET | Freq: Once | ORAL | Status: AC
  Administered 2017-07-21: 10 mg via ORAL
  Filled 2017-07-21: qty 1

## 2017-07-21 MED ORDER — HYDROCODONE-ACETAMINOPHEN 5-325 MG PO TABS: 2.0000 | ORAL_TABLET | Freq: Once | ORAL | Status: DC

## 2017-07-21 MED ORDER — POTASSIUM CHLORIDE CRYS ER 20 MEQ PO TBCR
40.0000 meq | EXTENDED_RELEASE_TABLET | Freq: Once | ORAL | Status: AC
  Administered 2017-07-21: 40 meq via ORAL
  Filled 2017-07-21: qty 2

## 2017-07-21 MED ORDER — SODIUM CHLORIDE 0.9 % IV SOLN
Freq: Once | INTRAVENOUS | Status: AC
  Administered 2017-07-21: 13:00:00 via INTRAVENOUS

## 2017-07-21 NOTE — Progress Notes (Signed)
Progress Note  Patient Name: Kylie Baker Date of Encounter: 07/21/2017  Primary Cardiologist: New  Subjective   Passing black clots Abdomen still hurts No dizziness  Inpatient Medications    Scheduled Meds: . Influenza vac split quadrivalent PF  0.5 mL Intramuscular Tomorrow-1000  . insulin aspart  0-5 Units Subcutaneous QHS  . insulin aspart  0-9 Units Subcutaneous TID WC  . levothyroxine  125 mcg Oral QAC breakfast  . metFORMIN  850 mg Oral BID WC  . potassium chloride  40 mEq Oral Once   Continuous Infusions:  PRN Meds: acetaminophen **OR** acetaminophen, traMADol   Vital Signs    Vitals:   07/20/17 1853 07/20/17 2035 07/21/17 0100 07/21/17 0445  BP: (!) 147/88     Pulse: 87  90   Resp: 13   19  Temp: 98.3 F (36.8 C) 98.3 F (36.8 C)    TempSrc:  Oral    SpO2: 100% 100% 99%   Weight:    243 lb 1.6 oz (110.3 kg)  Height:        Intake/Output Summary (Last 24 hours) at 07/21/17 0853 Last data filed at 07/21/17 0100  Gross per 24 hour  Intake                0 ml  Output              750 ml  Net             -750 ml   Filed Weights   07/19/17 1558 07/21/17 0445  Weight: 243 lb (110.2 kg) 243 lb 1.6 oz (110.3 kg)    Telemetry    NSR no arrhythmia - Personally Reviewed  ECG    NSR no acute ST changes  - Personally Reviewed  Physical Exam  Obese black female GEN: No acute distress.   Neck: No JVD Cardiac: RRR, no murmurs, rubs, or gallops.  Respiratory: Clear to auscultation bilaterally. GI: Soft, nontender, non-distended  MS: No edema; No deformity. Neuro:  Nonfocal  Psych: Normal affect   Labs    Chemistry Recent Labs Lab 07/19/17 1604 07/19/17 1620 07/20/17 0313 07/21/17 0342  NA 136 139 137 139  K 3.4* 3.8 3.5 3.1*  CL 104 102 109 109  CO2 25  --  20* 25  GLUCOSE 199* 197* 143* 126*  BUN 6 7 5* <5*  CREATININE 0.68 0.70 0.51 0.53  CALCIUM 8.7*  --  7.1* 7.2*  PROT 6.4*  --   --   --   ALBUMIN 3.7  --   --   --   AST 20   --   --   --   ALT 14  --   --   --   ALKPHOS 41  --   --   --   BILITOT 0.6  --   --   --   GFRNONAA >60  --  >60 >60  GFRAA >60  --  >60 >60  ANIONGAP 7  --  8 5     Hematology Recent Labs Lab 07/19/17 1953 07/20/17 0950 07/21/17 0342  WBC 11.1* 12.8* 8.5  RBC 3.73* 3.87 3.35*  HGB 8.4* 9.7* 8.3*  HCT 27.9* 30.3* 26.0*  MCV 74.8* 78.3 77.6*  MCH 22.5* 25.1* 24.8*  MCHC 30.1 32.0 31.9  RDW 15.1 15.3 15.3  PLT 117* 121* 96*    Cardiac Enzymes Recent Labs Lab 07/19/17 2113 07/20/17 0313  TROPONINI <0.03 <0.03    Recent Labs Lab 07/19/17  1618  TROPIPOC 0.00     BNPNo results for input(s): BNP, PROBNP in the last 168 hours.   DDimer  Recent Labs Lab 07/20/17 0313  DDIMER 0.97*     Radiology    Ct Head Wo Contrast  Result Date: 07/19/2017 CLINICAL DATA:  Found unresponsive at home. EXAM: CT HEAD WITHOUT CONTRAST CT CERVICAL SPINE WITHOUT CONTRAST TECHNIQUE: Multidetector CT imaging of the head and cervical spine was performed following the standard protocol without intravenous contrast. Multiplanar CT image reconstructions of the cervical spine were also generated. COMPARISON:  None. FINDINGS: CT HEAD FINDINGS Brain: No evidence of acute infarction, hemorrhage, hydrocephalus, extra-axial collection or mass lesion/mass effect. Vascular: No hyperdense vessel or unexpected calcification. Skull: Normal. Negative for fracture or focal lesion. Sinuses/Orbits: No acute finding. Other: None. CT CERVICAL SPINE FINDINGS Alignment: Normal. Skull base and vertebrae: No acute fracture. No primary bone lesion or focal pathologic process. Soft tissues and spinal canal: No prevertebral fluid or swelling. No visible canal hematoma. Disc levels: Anterior osteophyte formation is noted at C5-6 and C6-7. Disc spaces are otherwise well maintained. Upper chest: Negative. Other: None. IMPRESSION: Normal head CT. No significant abnormality seen in the cervical spine. Electronically Signed    By: Lupita Raider, M.D.   On: 07/19/2017 17:48   Ct Cervical Spine Wo Contrast  Result Date: 07/19/2017 CLINICAL DATA:  Found unresponsive at home. EXAM: CT HEAD WITHOUT CONTRAST CT CERVICAL SPINE WITHOUT CONTRAST TECHNIQUE: Multidetector CT imaging of the head and cervical spine was performed following the standard protocol without intravenous contrast. Multiplanar CT image reconstructions of the cervical spine were also generated. COMPARISON:  None. FINDINGS: CT HEAD FINDINGS Brain: No evidence of acute infarction, hemorrhage, hydrocephalus, extra-axial collection or mass lesion/mass effect. Vascular: No hyperdense vessel or unexpected calcification. Skull: Normal. Negative for fracture or focal lesion. Sinuses/Orbits: No acute finding. Other: None. CT CERVICAL SPINE FINDINGS Alignment: Normal. Skull base and vertebrae: No acute fracture. No primary bone lesion or focal pathologic process. Soft tissues and spinal canal: No prevertebral fluid or swelling. No visible canal hematoma. Disc levels: Anterior osteophyte formation is noted at C5-6 and C6-7. Disc spaces are otherwise well maintained. Upper chest: Negative. Other: None. IMPRESSION: Normal head CT. No significant abnormality seen in the cervical spine. Electronically Signed   By: Lupita Raider, M.D.   On: 07/19/2017 17:48   US Ob Transvaginal  Result Date: 07/19/2017 CLINICAL DATA:  Heavy vaginal bleeding. EXAM: TRANSVAGINAL OB ULTRASOUND TECHNIQUE: Transvaginal ultrasound was performed for complete evaluation of the gestation as well as the maternal uterus, adnexal regions, and pelvic cul-de-sac. COMPARISON:  07/11/17. FINDINGS: Intrauterine gestational sac: None Yolk sac:  Not Visualized. Embryo:  Not Visualized. Cardiac Activity: Not Visualized. Maternal uterus/adnexae: Subchorionic hemorrhage: Right ovary: Not visualized Left ovary: Not visualized Other :The endometrium appears thickened measuring 25 mm. The endocervical canal measures 3.5 cm  and appears distended with heterogeneous contents. There is residual fluid within the lower uterine segment which may represent sequelae of collapsed intrauterine gestational sac. Free fluid:  None IMPRESSION: 1. No intrauterine definite gestational sac, yolk sac, or fetal pole identified. The endometrial canal and endocervical canal appear distended with heterogeneous debris. The endometrium measures up to 2.5 cm. Findings are suspicious for abortion in progress. Alternatively, in the setting of missed abortion findings would be worrisome for retained products. Electronically Signed   By: Signa Kell M.D.   On: 07/19/2017 21:06   Dg Chest Portable 1 View  Result Date: 07/20/2017 CLINICAL  DATA:  Shortness of breath.  Miscarriage 1 week ago. EXAM: PORTABLE CHEST 1 VIEW COMPARISON:  01/02/2017 FINDINGS: Lungs are adequately inflated without consolidation or effusion. Cardiomediastinal silhouette, bones and soft tissues are within normal. IMPRESSION: No active disease. Electronically Signed   By: Elberta Fortis M.D.   On: 07/20/2017 01:18    Cardiac Studies   Echo 07/20/17 Normal EF 60-65% no valve disease  Patient Profile     41 y.o. female with "pre syncope" no cardiac etiology Telemetry normal and echo normal Miscarriage with significant bleed requiring transfusion and Hct low this am 26  Assessment & Plan    Pre Sycnope:  No cardiac no need for further w/u  Miscarriage:  Hct low again 26 likely needs another unit plan per primary service and OB had received cytotec at [facility]  Will sign off  For questions or updates, please contact [facility] Please consult [email or web address] for contact info under Cardiology/STEMI.      Signed, Charlton Haws, MD  07/21/2017, 8:53 AM

## 2017-07-21 NOTE — Progress Notes (Signed)
PROGRESS NOTE    Kylie Baker  RUE:[id number] DOB: [birth year] DOA: 07/19/2017 PCP: Patient, No Pcp Per   Outpatient Specialists:     Brief Narrative:  Kylie Baker  is a 41 y.o. female, w dm2, asthma apparently had vaginal bleeding and was sent to [facility] and pt was seen by Ob-gyn and was given something to stop the bleeding, which is most likely from miscarriage. Pt was sent back to [facility] because [facility] out at [facility].  "a few minutes"  In total she passed out 3 times. Once at home, once at [facility] and then at [facility]. Pt was sent back to [facility] for evaluation of syncope.   In ED, Hgb 12.2=> 8.4,  Plt 117,  CT brain  => normal head,  Ekg:  nsr at 80, nl axis, t inversion in v4-6.  Trop negative,  Pt will be admitted for syncope, likely secondary to symptomatic anemia secondary to vaginal bleeding from miscarriage.    Assessment & Plan:   Principal Problem:   Syncope Active Problems:   Symptomatic anemia   Miscarriage   Pre- Syncope -carotids- 40-59% b/l-- outpatient follow up -FLP in AM -suspect from bleeding -seen by cardiology-- no further work up  Symptomatic anemia from ABLA -s/p 3 units PRBC- H/H  Miscarriage/Vaginal bleeding,  Appreciate ob-gyn input- will repeat U/S at their recommendations -s/p cytotec  Hypokalemia repeleted and resolved  Dm2 fsbs ac and qhs, Iss   DVT prophylaxis:  SCD's  Code Status: Full Code   Family Communication:   Disposition Plan:     Consultants:   Cards  gyn     Subjective: C/o dizziness  Objective: Vitals:   07/21/17 0100 07/21/17 0445 07/21/17 1230 07/21/17 1300  BP:   108/64 135/87  Pulse: 90  83 84  Resp:  Temp:   98.5 F (36.9 C) 98.4 F (36.9 C)  TempSrc:   Oral Oral  SpO2: 99%  100% 100%  Weight:  110.3 kg (243 lb 1.6 oz)    Height:        Intake/Output Summary (Last 24 hours) at 07/21/17 1438 Last data filed at  07/21/17 0100  Gross per 24 hour  Intake                0 ml  Output              750 ml  Net             -750 ml   Filed Weights   07/19/17 1558 07/21/17 0445  Weight: 110.2 kg (243 lb) 110.3 kg (243 lb 1.6 oz)    Examination:  General exam: Appears calm and comfortable  Respiratory system: Clear to auscultation. Respiratory effort normal. Cardiovascular system: S1 & S2 heard, RRR. No JVD, murmurs, rubs, gallops or clicks. No pedal edema. Gastrointestinal system: Abdomen is nondistended, soft and nontender. No organomegaly or masses felt. Normal bowel sounds heard. Central nervous system: Alert and oriented. No focal neurological deficits. Extremities: Symmetric 5 x 5 power. Psychiatry: flat adffect.     Data Reviewed: I have personally reviewed following labs and imaging studies  CBC:  Recent Labs Lab 07/19/17 1604 07/19/17 1620 07/19/17 1953 07/20/17 0950 07/21/17 0342  WBC 9.7  --  11.1* 12.8* 8.5  NEUTROABS  --   --   --  8.4*  --   HGB 10.7* 12.2 8.4* 9.7* 8.3*  HCT 35.6* 36.0 27.9* 30.3*  26.0*  MCV 74.9*  --  74.8* 78.3 77.6*  PLT 153  --  117* 121* 96*   Basic Metabolic Panel:  Recent Labs Lab 07/19/17 1604 07/19/17 1620 07/20/17 0313 07/21/17 0342  NA 136 139 137 139  K 3.4* 3.8 3.5 3.1*  CL 104 102 109 109  CO2 25  --  20* 25  GLUCOSE 199* 197* 143* 126*  BUN 6 7 5* <5*  CREATININE 0.68 0.70 0.51 0.53  CALCIUM 8.7*  --  7.1* 7.2*   GFR: Estimated Creatinine Clearance: 109.3 mL/min (by C-G formula based on SCr of 0.53 mg/dL). Liver Function Tests:  Recent Labs Lab 07/19/17 1604  AST 20  ALT 14  ALKPHOS 41  BILITOT 0.6  PROT 6.4*  ALBUMIN 3.7   No results for input(s): LIPASE, AMYLASE in the last 168 hours. No results for input(s): AMMONIA in the last 168 hours. Coagulation Profile:  Recent Labs Lab 07/20/17 0313  INR 1.17   Cardiac Enzymes:  Recent Labs Lab 07/19/17 2113 07/20/17 0313  TROPONINI <0.03 <0.03   BNP (last  3 results) No results for input(s): PROBNP in the last 8760 hours. HbA1C: No results for input(s): HGBA1C in the last 72 hours. CBG:  Recent Labs Lab 07/20/17 0341 07/20/17 1918 07/20/17 2135 07/21/17 0637 07/21/17 1136  GLUCAP 147* 97 161* 112* 144*   Lipid Profile: No results for input(s): CHOL, HDL, LDLCALC, TRIG, CHOLHDL, LDLDIRECT in the last 72 hours. Thyroid Function Tests: No results for input(s): TSH, T4TOTAL, FREET4, T3FREE, THYROIDAB in the last 72 hours. Anemia Panel: No results for input(s): VITAMINB12, FOLATE, FERRITIN, TIBC, IRON, RETICCTPCT in the last 72 hours. Urine analysis:    Component Value Date/Time   COLORURINE YELLOW 07/19/2017 1705   APPEARANCEUR CLEAR 07/19/2017 1705   LABSPEC 1.020 07/19/2017 1705   PHURINE 5.0 07/19/2017 1705   GLUCOSEU NEGATIVE 07/19/2017 1705   HGBUR NEGATIVE 07/19/2017 1705   BILIRUBINUR NEGATIVE 07/19/2017 1705   KETONESUR 5 (A) 07/19/2017 1705   PROTEINUR NEGATIVE 07/19/2017 1705   UROBILINOGEN 0.2 09/07/2012 0108   NITRITE NEGATIVE 07/19/2017 1705   LEUKOCYTESUR NEGATIVE 07/19/2017 1705     ) Recent Results (from the past 240 hour(s))  Wet prep, genital     Status: Abnormal   Collection Time: 07/11/17  7:38 PM  Result Value Ref Range Status   Yeast Wet Prep HPF POC NONE SEEN NONE SEEN Final   Trich, Wet Prep NONE SEEN NONE SEEN Final   Clue Cells Wet Prep HPF POC PRESENT (A) NONE SEEN Final   WBC, Wet Prep HPF POC FEW (A) NONE SEEN Final    Comment: MODERATE BACTERIA SEEN   Sperm NONE SEEN  Final      Anti-infectives    None       Radiology Studies: Ct Head Wo Contrast  Result Date: 07/19/2017 CLINICAL DATA:  Found unresponsive at home. EXAM: CT HEAD WITHOUT CONTRAST CT CERVICAL SPINE WITHOUT CONTRAST TECHNIQUE: Multidetector CT imaging of the head and cervical spine was performed following the standard protocol without intravenous contrast. Multiplanar CT image reconstructions of the cervical spine were  also generated. COMPARISON:  None. FINDINGS: CT HEAD FINDINGS Brain: No evidence of acute infarction, hemorrhage, hydrocephalus, extra-axial collection or mass lesion/mass effect. Vascular: No hyperdense vessel or unexpected calcification. Skull: Normal. Negative for fracture or focal lesion. Sinuses/Orbits: No acute finding. Other: None. CT CERVICAL SPINE FINDINGS Alignment: Normal. Skull base and vertebrae: No acute fracture. No primary bone lesion or focal pathologic process.  Soft tissues and spinal canal: No prevertebral fluid or swelling. No visible canal hematoma. Disc levels: Anterior osteophyte formation is noted at C5-6 and C6-7. Disc spaces are otherwise well maintained. Upper chest: Negative. Other: None. IMPRESSION: Normal head CT. No significant abnormality seen in the cervical spine. Electronically Signed   By: Lupita Raider, M.D.   On: 07/19/2017 17:48   Ct Cervical Spine Wo Contrast  Result Date: 07/19/2017 CLINICAL DATA:  Found unresponsive at home. EXAM: CT HEAD WITHOUT CONTRAST CT CERVICAL SPINE WITHOUT CONTRAST TECHNIQUE: Multidetector CT imaging of the head and cervical spine was performed following the standard protocol without intravenous contrast. Multiplanar CT image reconstructions of the cervical spine were also generated. COMPARISON:  None. FINDINGS: CT HEAD FINDINGS Brain: No evidence of acute infarction, hemorrhage, hydrocephalus, extra-axial collection or mass lesion/mass effect. Vascular: No hyperdense vessel or unexpected calcification. Skull: Normal. Negative for fracture or focal lesion. Sinuses/Orbits: No acute finding. Other: None. CT CERVICAL SPINE FINDINGS Alignment: Normal. Skull base and vertebrae: No acute fracture. No primary bone lesion or focal pathologic process. Soft tissues and spinal canal: No prevertebral fluid or swelling. No visible canal hematoma. Disc levels: Anterior osteophyte formation is noted at C5-6 and C6-7. Disc spaces are otherwise well maintained.  Upper chest: Negative. Other: None. IMPRESSION: Normal head CT. No significant abnormality seen in the cervical spine. Electronically Signed   By: Lupita Raider, M.D.   On: 07/19/2017 17:48   US Ob Transvaginal  Result Date: 07/19/2017 CLINICAL DATA:  Heavy vaginal bleeding. EXAM: TRANSVAGINAL OB ULTRASOUND TECHNIQUE: Transvaginal ultrasound was performed for complete evaluation of the gestation as well as the maternal uterus, adnexal regions, and pelvic cul-de-sac. COMPARISON:  07/11/17. FINDINGS: Intrauterine gestational sac: None Yolk sac:  Not Visualized. Embryo:  Not Visualized. Cardiac Activity: Not Visualized. Maternal uterus/adnexae: Subchorionic hemorrhage: Right ovary: Not visualized Left ovary: Not visualized Other :The endometrium appears thickened measuring 25 mm. The endocervical canal measures 3.5 cm and appears distended with heterogeneous contents. There is residual fluid within the lower uterine segment which may represent sequelae of collapsed intrauterine gestational sac. Free fluid:  None IMPRESSION: 1. No intrauterine definite gestational sac, yolk sac, or fetal pole identified. The endometrial canal and endocervical canal appear distended with heterogeneous debris. The endometrium measures up to 2.5 cm. Findings are suspicious for abortion in progress. Alternatively, in the setting of missed abortion findings would be worrisome for retained products. Electronically Signed   By: Signa Kell M.D.   On: 07/19/2017 21:06   Dg Chest Portable 1 View  Result Date: 07/20/2017 CLINICAL DATA:  Shortness of breath.  Miscarriage 1 week ago. EXAM: PORTABLE CHEST 1 VIEW COMPARISON:  01/02/2017 FINDINGS: Lungs are adequately inflated without consolidation or effusion. Cardiomediastinal silhouette, bones and soft tissues are within normal. IMPRESSION: No active disease. Electronically Signed   By: Elberta Fortis M.D.   On: 07/20/2017 01:18        Scheduled Meds: . insulin aspart  0-5 Units  Subcutaneous QHS  . insulin aspart  0-9 Units Subcutaneous TID WC  . levothyroxine  125 mcg Oral QAC breakfast  . metFORMIN  850 mg Oral BID WC  . methylergonovine  0.2 mg Oral Q6H   Continuous Infusions:   LOS: 1 day    Time spent: 35 min    Medina Degraffenreid U Jyair Kiraly, DO [facility] Pager [phone]  If 7PM-7AM, please contact night-coverage [email or web address] Password [email or web address] 07/21/2017, 2:38 PM

## 2017-07-21 NOTE — Care Management Note (Addendum)
Case Management Note Donn Pierini RN, BSN Unit 4E-Case Manager [phone]  Patient Details  Name: Mccall Will MRN: [id number] Date of Birth: 1976/05/15  Subjective/Objective:   Pt admitted with syncope- secondary to symptomatic anemia from miscarriage                  Action/Plan: PTA pt lived at home with spouse- independent- anticipate return home- has PCP- Dr. Chauncey Fischer-- CM will follow  Expected Discharge Date:                  Expected Discharge Plan:  Home/Self Care  In-House Referral:     Discharge planning Services  CM Consult  Post Acute Care Choice:  NA Choice offered to:  NA  DME Arranged:    DME Agency:     HH Arranged:    HH Agency:     Status of Service:  In process, will continue to follow  If discussed at Long Length of Stay Meetings, dates discussed:    Discharge Disposition:   Additional Comments:  Darrold Span, RN 07/21/2017, 10:52 AM

## 2017-07-22 LAB — MAGNESIUM: MAGNESIUM: 1.6 mg/dL — AB (ref 1.7–2.4)

## 2017-07-22 LAB — BPAM RBC
BLOOD PRODUCT EXPIRATION DATE: 201810162359
BLOOD PRODUCT EXPIRATION DATE: 201810122359
Blood Product Expiration Date: 201810162359
Blood Product Expiration Date: 201810092359
Blood Product Expiration Date: 201810122359
ISSUE DATE / TIME: 201809302112
ISSUE DATE / TIME: 201810010344
ISSUE DATE / TIME: 201810010516
ISSUE DATE / TIME: 201810021218
Unit Type and Rh: 5100
Unit Type and Rh: 5100
Unit Type and Rh: 7300
Unit Type and Rh: 7300
Unit Type and Rh: 7300

## 2017-07-22 LAB — CBC
HCT: 30.3 % — ABNORMAL LOW (ref 36.0–46.0)
Hemoglobin: 10 g/dL — ABNORMAL LOW (ref 12.0–15.0)
MCH: 26.2 pg (ref 26.0–34.0)
MCHC: 33 g/dL (ref 30.0–36.0)
MCV: 79.5 fL (ref 78.0–100.0)
PLATELETS: 108 10*3/uL — AB (ref 150–400)
RBC: 3.81 MIL/uL — ABNORMAL LOW (ref 3.87–5.11)
RDW: 16 % — AB (ref 11.5–15.5)
WBC: 9.8 10*3/uL (ref 4.0–10.5)

## 2017-07-22 LAB — GLUCOSE, CAPILLARY
GLUCOSE-CAPILLARY: 102 mg/dL — AB (ref 65–99)
GLUCOSE-CAPILLARY: 112 mg/dL — AB (ref 65–99)
Glucose-Capillary: 77 mg/dL (ref 65–99)
Glucose-Capillary: 112 mg/dL — ABNORMAL HIGH (ref 65–99)

## 2017-07-22 LAB — TYPE AND SCREEN
ABO/RH(D): B POS
ABO/RH(D): B POS
Antibody Screen: NEGATIVE
Antibody Screen: NEGATIVE
UNIT DIVISION: 0
UNIT DIVISION: 0
Unit division: 0
Unit division: 0
Unit division: 0

## 2017-07-22 LAB — BASIC METABOLIC PANEL
Anion gap: 7 (ref 5–15)
CALCIUM: 7.6 mg/dL — AB (ref 8.9–10.3)
CO2: 27 mmol/L (ref 22–32)
CREATININE: 0.58 mg/dL (ref 0.44–1.00)
Chloride: 103 mmol/L (ref 101–111)
Glucose, Bld: 84 mg/dL (ref 65–99)
Potassium: 3 mmol/L — ABNORMAL LOW (ref 3.5–5.1)
SODIUM: 137 mmol/L (ref 135–145)

## 2017-07-22 MED ORDER — POTASSIUM CHLORIDE CRYS ER 20 MEQ PO TBCR
40.0000 meq | EXTENDED_RELEASE_TABLET | ORAL | Status: AC
  Administered 2017-07-22 (×2): 40 meq via ORAL
  Filled 2017-07-22 (×2): qty 2

## 2017-07-22 MED ORDER — MAGNESIUM SULFATE 2 GM/50ML IV SOLN
2.0000 g | Freq: Once | INTRAVENOUS | Status: AC
  Administered 2017-07-22: 2 g via INTRAVENOUS
  Filled 2017-07-22: qty 50

## 2017-07-22 MED ORDER — POLYSACCHARIDE IRON COMPLEX 150 MG PO CAPS
150.0000 mg | ORAL_CAPSULE | Freq: Every day | ORAL | Status: DC
  Administered 2017-07-23: 150 mg via ORAL
  Filled 2017-07-22: qty 1

## 2017-07-22 MED ORDER — SODIUM CHLORIDE 0.9 % IV SOLN
1.0000 g | Freq: Once | INTRAVENOUS | Status: AC
  Administered 2017-07-22: 1 g via INTRAVENOUS
  Filled 2017-07-22: qty 10

## 2017-07-22 NOTE — Progress Notes (Signed)
PROGRESS NOTE    [facility]  ION:[id number] DOB: 08-Aug-1976 DOA: 07/19/2017 PCP: Patient, No Pcp Per   Outpatient Specialists:     Brief Narrative:  [facility]  is a 41 y.o. female, w dm2, asthma apparently had vaginal bleeding and was sent to [facility] ER and pt was seen by Ob-gyn and was given something to stop the bleeding, which is most likely from miscarriage. Pt was sent back to [facility] because she passed out at [facility].  "a few minutes"  In total she passed out 3 times. Once at home, once at [facility] and then at [facility] ER. Pt was sent back to [facility] ED for evaluation of syncope.   In ED, Hgb 12.2=> 8.4,  Plt 117,  CT brain  => normal head,  Ekg:  nsr at 80, nl axis, t inversion in v4-6.  Trop negative,  Pt will be admitted for syncope, likely secondary to symptomatic anemia secondary to vaginal bleeding from miscarriage.    Assessment & Plan:   Principal Problem:   Syncope Active Problems:   Symptomatic anemia   Miscarriage   Pre- Syncope -carotids- 40-59% b/l-- outpatient follow up -suspect from bleeding -seen by cardiology-- no further work up  Symptomatic anemia from ABLA -s/p 3 units PRBC- H/H stable -will repeat in AM  Miscarriage/Vaginal bleeding,  Appreciate ob-gyn input- will repeat U/S at their recommendations-- await reading -s/p cytotec S/p methergine  Hypokalemia repelet Replete magnesium as well    DVT prophylaxis:  SCD's  Code Status: Full Code   Family Communication:   Disposition Plan:     Consultants:   Cards  gyn     Subjective: Patient told me this AM she was anxious to go home-- now with a friend at the bedside she is saying she is weak and dizzy-- has been refusing to ambulate  Objective: Vitals:   07/22/17 0643 07/22/17 1414 07/22/17 1425 07/22/17 1442  BP: 116/84 (!) 152/98 (!) 144/99 (!) 139/99  Pulse:   86   Resp:    (!) 24  Temp: 98.3 F (36.8 C) 98.5 F (36.9  C)    TempSrc: Oral Oral    SpO2: 100% 99%    Weight: 106.9 kg (235 lb 9.6 oz)     Height:        Intake/Output Summary (Last 24 hours) at 07/22/17 1447 Last data filed at 07/22/17 1300  Gross per 24 hour  Intake             1064 ml  Output                0 ml  Net             1064 ml   Filed Weights   07/19/17 1558 07/21/17 0445 07/22/17 0643  Weight: 110.2 kg (243 lb) 110.3 kg (243 lb 1.6 oz) 106.9 kg (235 lb 9.6 oz)    Examination:  General exam: sleeping but will awake, wants to go home to see kids Respiratory system: clear Cardiovascular system: rrr Gastrointestinal system: +BS, soft Central nervous system: A+Ox3 Psychiatry: more interactive today     Data Reviewed: I have personally reviewed following labs and imaging studies  CBC:  Recent Labs Lab 07/19/17 1604  07/19/17 1953 07/20/17 0950 07/21/17 0342 07/21/17 1643 07/21/17 2207 07/22/17 0126  WBC 9.7  --  11.1* 12.8* 8.5  --   --  9.8  NEUTROABS  --   --   --  8.4*  --   --   --   --   HGB 10.7*  < > 8.4* 9.7* 8.3*  --  9.5* 10.0*  HCT 35.6*  < > 27.9* 30.3* 26.0*  --  29.7* 30.3*  MCV 74.9*  --  74.8* 78.3 77.6*  --   --  79.5  PLT 153  --  117* 121* 96* 107*  --  108*  < > = values in this interval not displayed. Basic Metabolic Panel:  Recent Labs Lab 07/19/17 1604 07/19/17 1620 07/20/17 0313 07/21/17 0342 07/22/17 0126  NA 136 139 137 139 137  K 3.4* 3.8 3.5 3.1* 3.0*  CL 104 102 109 109 103  CO2 25  --  20* 25 27  GLUCOSE 199* 197* 143* 126* 84  BUN 6 7 5* <5* <5*  CREATININE 0.68 0.70 0.51 0.53 0.58  CALCIUM 8.7*  --  7.1* 7.2* 7.6*  MG  --   --   --   --  1.6*   GFR: Estimated Creatinine Clearance: 107.3 mL/min (by C-G formula based on SCr of 0.58 mg/dL). Liver Function Tests:  Recent Labs Lab 07/19/17 1604  AST 20  ALT 14  ALKPHOS 41  BILITOT 0.6  PROT 6.4*  ALBUMIN 3.7   No results for input(s): LIPASE, AMYLASE in the last 168 hours. No results for input(s):  AMMONIA in the last 168 hours. Coagulation Profile:  Recent Labs Lab 07/20/17 0313 07/21/17 1643  INR 1.17 0.99   Cardiac Enzymes:  Recent Labs Lab 07/19/17 2113 07/20/17 0313  TROPONINI <0.03 <0.03   BNP (last 3 results) No results for input(s): PROBNP in the last 8760 hours. HbA1C: No results for input(s): HGBA1C in the last 72 hours. CBG:  Recent Labs Lab 07/21/17 0637 07/21/17 1136 07/21/17 2133 07/22/17 0647 07/22/17 1211  GLUCAP 112* 144* 92 77 112*   Lipid Profile: No results for input(s): CHOL, HDL, LDLCALC, TRIG, CHOLHDL, LDLDIRECT in the last 72 hours. Thyroid Function Tests: No results for input(s): TSH, T4TOTAL, FREET4, T3FREE, THYROIDAB in the last 72 hours. Anemia Panel: No results for input(s): VITAMINB12, FOLATE, FERRITIN, TIBC, IRON, RETICCTPCT in the last 72 hours. Urine analysis:    Component Value Date/Time   COLORURINE YELLOW 07/19/2017 1705   APPEARANCEUR CLEAR 07/19/2017 1705   LABSPEC 1.020 07/19/2017 1705   PHURINE 5.0 07/19/2017 1705   GLUCOSEU NEGATIVE 07/19/2017 1705   HGBUR NEGATIVE 07/19/2017 1705   BILIRUBINUR NEGATIVE 07/19/2017 1705   KETONESUR 5 (A) 07/19/2017 1705   PROTEINUR NEGATIVE 07/19/2017 1705   UROBILINOGEN 0.2 09/07/2012 0108   NITRITE NEGATIVE 07/19/2017 1705   LEUKOCYTESUR NEGATIVE 07/19/2017 1705     ) No results found for this or any previous visit (from the past 240 hour(s)).    Anti-infectives    None       Radiology Studies: No results found.      Scheduled Meds: . HYDROcodone-acetaminophen  2 tablet Oral Once  . insulin aspart  0-5 Units Subcutaneous QHS  . insulin aspart  0-9 Units Subcutaneous TID WC  . iron polysaccharides  150 mg Oral Daily  . levothyroxine  125 mcg Oral QAC breakfast  . metFORMIN  850 mg Oral BID WC   Continuous Infusions:   LOS: 2 days    Time spent: 25 min    Kylie Stemen U Kimiyo Carmicheal, DO [facility] Pager [phone]  If 7PM-7AM, please contact  night-coverage [email or web address] Password [email or web address] 07/22/2017, 2:47 PM

## 2017-07-23 DIAGNOSIS — O209 Hemorrhage in early pregnancy, unspecified: Secondary | ICD-10-CM

## 2017-07-23 LAB — BASIC METABOLIC PANEL
ANION GAP: 7 (ref 5–15)
BUN: <5 mg/dL — ABNORMAL LOW (ref 6–20)
CALCIUM: 8 mg/dL — AB (ref 8.9–10.3)
CHLORIDE: 105 mmol/L (ref 101–111)
CO2: 28 mmol/L (ref 22–32)
Creatinine, Ser: 0.61 mg/dL (ref 0.44–1.00)
GFR calc Af Amer: >60 mL/min (ref >60)
GFR calc non Af Amer: >60 mL/min (ref >60)
GLUCOSE: 164 mg/dL — AB (ref 65–99)
Potassium: 3.5 mmol/L (ref 3.5–5.1)
Sodium: 140 mmol/L (ref 135–145)

## 2017-07-23 LAB — GLUCOSE, CAPILLARY
GLUCOSE-CAPILLARY: 165 mg/dL — AB (ref 65–99)
Glucose-Capillary: 127 mg/dL — ABNORMAL HIGH (ref 65–99)

## 2017-07-23 LAB — CBC
HEMATOCRIT: 28.3 % — AB (ref 36.0–46.0)
Hemoglobin: 9.1 g/dL — ABNORMAL LOW (ref 12.0–15.0)
MCH: 26.1 pg (ref 26.0–34.0)
MCHC: 32.2 g/dL (ref 30.0–36.0)
MCV: 81.1 fL (ref 78.0–100.0)
Platelets: 123 10*3/uL — ABNORMAL LOW (ref 150–400)
RBC: 3.49 MIL/uL — ABNORMAL LOW (ref 3.87–5.11)
RDW: 16.5 % — AB (ref 11.5–15.5)
WBC: 7.1 10*3/uL (ref 4.0–10.5)

## 2017-07-23 LAB — TSH: TSH: 12.935 u[IU]/mL — AB (ref 0.350–4.500)

## 2017-07-23 NOTE — Discharge Summary (Signed)
Physician Discharge Summary  Krystina Strieter ZOX:[id number] DOB: Feb 19, 1976 DOA: 07/19/2017  PCP: [name], PA-C  Admit date: 07/19/2017 Discharge date: 07/23/2017   Recommendations for Outpatient Follow-Up:   1. CBC and TSH 2. Follow BHcg to 0   Discharge Diagnosis:   Principal Problem:   Syncope Active Problems:   Symptomatic anemia   Miscarriage   Discharge disposition:  Home  Discharge Condition: Improved.  Diet recommendation: Low sodium, heart healthy  Wound care: None.   History of Present Illness:   Kylie Baker  is a 41 y.o. female, w dm2, asthma apparently had vaginal bleeding and was sent to [facility] ER and pt was seen by Ob-gyn and was given something to stop the bleeding, which is most likely from miscarriage. Pt was sent back to [facility] because she passed out at [facility].  "a few minutes"  In total she passed out 3 times. Once at home, once at [facility] and then at [facility] ER. Pt was sent back to [facility] ED for evaluation of syncope.   In ED, Hgb 12.2=> 8.4,  Plt 117,  CT brain  => normal head,  Ekg:  nsr at 80, nl axis, t inversion in v4-6.  Trop negative,  Pt will be admitted for syncope, likely secondary to symptomatic anemia secondary to vaginal bleeding from miscarriage.    Hospital Course by Problem:    Pre- Syncope -carotids- 40-59% b/l-- outpatient follow up -syncope from bleeding -seen by cardiology-- no further work up -echo: Normal study  Symptomatic anemia from ABLA -s/p 3 units PRBC- H/H stable  Miscarriage/Vaginal bleeding,  Appreciate ob-gyn input- repeat U/S shows No IUP visualized -s/p cytotec S/p methergine -per patient, less bleeding  Hypokalemia repelete Replete magnesium as well  Dizziness -hypoperfusion -seen by PT--improved with PT -outpatient follow up   Medical Consultants:    Cards  GYN (phone)   Discharge Exam:   Vitals:   07/23/17 0500 07/23/17 0814    BP: 127/79 122/74  Pulse:  86  Resp: 16   Temp: 98.5 F (36.9 C) 98.5 F (36.9 C)  SpO2: 100% 97%   Vitals:   07/22/17 1442 07/22/17 2037 07/23/17 0500 07/23/17 0814  BP: (!) 139/99 (!) 142/90 127/79 122/74  Pulse:    86  Resp: (!) Temp:  99.2 F (37.3 C) 98.5 F (36.9 C) 98.5 F (36.9 C)  TempSrc:  Oral Oral Oral  SpO2:  98% 100% 97%  Weight:   107.4 kg (236 lb 12.8 oz)   Height:        Gen:  NAD- feeling better   The results of significant diagnostics from this hospitalization (including imaging, microbiology, ancillary and laboratory) are listed below for reference.     Procedures and Diagnostic Studies:   Ct Head Wo Contrast  Result Date: 07/19/2017 CLINICAL DATA:  Found unresponsive at home. EXAM: CT HEAD WITHOUT CONTRAST CT CERVICAL SPINE WITHOUT CONTRAST TECHNIQUE: Multidetector CT imaging of the head and cervical spine was performed following the standard protocol without intravenous contrast. Multiplanar CT image reconstructions of the cervical spine were also generated. COMPARISON:  None. FINDINGS: CT HEAD FINDINGS Brain: No evidence of acute infarction, hemorrhage, hydrocephalus, extra-axial collection or mass lesion/mass effect. Vascular: No hyperdense vessel or unexpected calcification. Skull: Normal. Negative for fracture or focal lesion. Sinuses/Orbits: No acute finding. Other: None. CT CERVICAL SPINE FINDINGS Alignment: Normal. Skull base and vertebrae: No acute fracture. No primary bone lesion or focal  pathologic process. Soft tissues and spinal canal: No prevertebral fluid or swelling. No visible canal hematoma. Disc levels: Anterior osteophyte formation is noted at C5-6 and C6-7. Disc spaces are otherwise well maintained. Upper chest: Negative. Other: None. IMPRESSION: Normal head CT. No significant abnormality seen in the cervical spine. Electronically Signed   By: Lupita Raider, M.D.   On: 07/19/2017 17:48   Ct Cervical Spine Wo  Contrast  Result Date: 07/19/2017 CLINICAL DATA:  Found unresponsive at home. EXAM: CT HEAD WITHOUT CONTRAST CT CERVICAL SPINE WITHOUT CONTRAST TECHNIQUE: Multidetector CT imaging of the head and cervical spine was performed following the standard protocol without intravenous contrast. Multiplanar CT image reconstructions of the cervical spine were also generated. COMPARISON:  None. FINDINGS: CT HEAD FINDINGS Brain: No evidence of acute infarction, hemorrhage, hydrocephalus, extra-axial collection or mass lesion/mass effect. Vascular: No hyperdense vessel or unexpected calcification. Skull: Normal. Negative for fracture or focal lesion. Sinuses/Orbits: No acute finding. Other: None. CT CERVICAL SPINE FINDINGS Alignment: Normal. Skull base and vertebrae: No acute fracture. No primary bone lesion or focal pathologic process. Soft tissues and spinal canal: No prevertebral fluid or swelling. No visible canal hematoma. Disc levels: Anterior osteophyte formation is noted at C5-6 and C6-7. Disc spaces are otherwise well maintained. Upper chest: Negative. Other: None. IMPRESSION: Normal head CT. No significant abnormality seen in the cervical spine. Electronically Signed   By: Lupita Raider, M.D.   On: 07/19/2017 17:48   US Ob Transvaginal  Result Date: 07/19/2017 CLINICAL DATA:  Heavy vaginal bleeding. EXAM: TRANSVAGINAL OB ULTRASOUND TECHNIQUE: Transvaginal ultrasound was performed for complete evaluation of the gestation as well as the maternal uterus, adnexal regions, and pelvic cul-de-sac. COMPARISON:  07/11/17. FINDINGS: Intrauterine gestational sac: None Yolk sac:  Not Visualized. Embryo:  Not Visualized. Cardiac Activity: Not Visualized. Maternal uterus/adnexae: Subchorionic hemorrhage: Right ovary: Not visualized Left ovary: Not visualized Other :The endometrium appears thickened measuring 25 mm. The endocervical canal measures 3.5 cm and appears distended with heterogeneous contents. There is residual fluid  within the lower uterine segment which may represent sequelae of collapsed intrauterine gestational sac. Free fluid:  None IMPRESSION: 1. No intrauterine definite gestational sac, yolk sac, or fetal pole identified. The endometrial canal and endocervical canal appear distended with heterogeneous debris. The endometrium measures up to 2.5 cm. Findings are suspicious for abortion in progress. Alternatively, in the setting of missed abortion findings would be worrisome for retained products. Electronically Signed   By: Signa Kell M.D.   On: 07/19/2017 21:06   Dg Chest Portable 1 View  Result Date: 07/20/2017 CLINICAL DATA:  Shortness of breath.  Miscarriage 1 week ago. EXAM: PORTABLE CHEST 1 VIEW COMPARISON:  01/02/2017 FINDINGS: Lungs are adequately inflated without consolidation or effusion. Cardiomediastinal silhouette, bones and soft tissues are within normal. IMPRESSION: No active disease. Electronically Signed   By: Elberta Fortis M.D.   On: 07/20/2017 01:18     Labs:   Basic Metabolic Panel:  Recent Labs Lab 07/19/17 1604 07/19/17 1620 07/20/17 0313 07/21/17 0342 07/22/17 0126 07/23/17 0317  NA 136 139 137 139 137 140  K 3.4* 3.8 3.5 3.1* 3.0* 3.5  CL 104 102 109 109 103 105  CO2 25  --  20* GLUCOSE 199* 197* 143* 126* 84 164*  BUN 6 7 5* <5* <5* <5*  CREATININE 0.68 0.70 0.51 0.53 0.58 0.61  CALCIUM 8.7*  --  7.1* 7.2* 7.6* 8.0*  MG  --   --   --   --  1.6*  --    GFR Estimated Creatinine Clearance: 107.6 mL/min (by C-G formula based on SCr of 0.61 mg/dL). Liver Function Tests:  Recent Labs Lab 07/19/17 1604  AST 20  ALT 14  ALKPHOS 41  BILITOT 0.6  PROT 6.4*  ALBUMIN 3.7   No results for input(s): LIPASE, AMYLASE in the last 168 hours. No results for input(s): AMMONIA in the last 168 hours. Coagulation profile  Recent Labs Lab 07/20/17 0313 07/21/17 1643  INR 1.17 0.99    CBC:  Recent Labs Lab 07/19/17 1953 07/20/17 0950 07/21/17 0342  07/21/17 1643 07/21/17 2207 07/22/17 0126 07/23/17 0317  WBC 11.1* 12.8* 8.5  --   --  9.8 7.1  NEUTROABS  --  8.4*  --   --   --   --   --   HGB 8.4* 9.7* 8.3*  --  9.5* 10.0* 9.1*  HCT 27.9* 30.3* 26.0*  --  29.7* 30.3* 28.3*  MCV 74.8* 78.3 77.6*  --   --  79.5 81.1  PLT 117* 121* 96* 107*  --  108* 123*   Cardiac Enzymes:  Recent Labs Lab 07/19/17 2113 07/20/17 0313  TROPONINI <0.03 <0.03   BNP: Invalid input(s): POCBNP CBG:  Recent Labs Lab 07/22/17 1211 07/22/17 1621 07/22/17 2049 07/23/17 0614 07/23/17 1300  GLUCAP 112* 102* 112* 127* 165*   D-Dimer  Recent Labs  07/21/17 1643  DDIMER 0.40   Hgb A1c No results for input(s): HGBA1C in the last 72 hours. Lipid Profile No results for input(s): CHOL, HDL, LDLCALC, TRIG, CHOLHDL, LDLDIRECT in the last 72 hours. Thyroid function studies  Recent Labs  07/23/17 1053  TSH 12.935*   Anemia work up No results for input(s): VITAMINB12, FOLATE, FERRITIN, TIBC, IRON, RETICCTPCT in the last 72 hours. Microbiology No results found for this or any previous visit (from the past 240 hour(s)).   Discharge Instructions:   Discharge Instructions    Diet Carb Modified    Complete by:  As directed    Discharge instructions    Complete by:  As directed    Cbc/tsh/free t4 1 week   Increase activity slowly    Complete by:  As directed      Allergies as of 07/23/2017      Reactions   Aspirin Itching, Rash      Medication List    STOP taking these medications   lisinopril 20 MG tablet Commonly known as:  PRINIVIL,ZESTRIL     TAKE these medications   ferrous sulfate 325 (65 FE) MG tablet Take 1 tablet (325 mg total) by mouth daily.   levothyroxine 125 MCG tablet Commonly known as:  SYNTHROID, LEVOTHROID Take 125 mcg by mouth daily before breakfast.   metFORMIN 850 MG tablet Commonly known as:  GLUCOPHAGE Take 850 mg by mouth 2 (two) times daily with a meal.            Durable Medical Equipment         Start     Ordered   07/23/17 1111  For home use only DME Walker rolling  Once    Question:  Patient needs a walker to treat with the following condition  Answer:  Vertigo   07/23/17 1110     Follow-up Information    [facility] - Dme Follow up.   Why:  rolling walker arranged- to be delivered to room prior to discharge Contact information: [address] [place] [address] [phone]  Lonergan, Malia A, PA-C Follow up in 1 week(s).   Specialty:  Physician Assistant Why:  TSH/CBC/free t4 Contact information: [address] [place] [address] [phone]            Time coordinating discharge: 35 min  Signed:  Kemet Nijjar Juanetta Gosling   [facility] 07/23/2017, 1:08 PM

## 2017-07-23 NOTE — Evaluation (Signed)
Physical Therapy Evaluation Patient Details Name: Kylie Baker MRN: [id number] DOB: 29-Dec-1975 Today's Date: 07/23/2017   History of Present Illness  [name] a 41 y.o.female,w dm2, asthma apparently had vaginal bleeding and was sent to [facility] ER and pt was seen by Ob-gyn and was given something to stop the bleeding, which is most likely from miscarriage. Pt was sent back to [facility] because she passed out at [facility]. "a few minutes" In total she passed out 3 times. Once at home, once at [facility] and then at [facility] ER. Pt was sent back to [facility] ED for evaluation of syncope. THis PT asked by MD to consult for vertigo.  Clinical Impression  Pt admitted with above diagnosis. Pt currently with functional limitations due to the deficits listed below (see PT Problem List). Pt tested for BPPV with positive left BPPV.  Treated with canalith repositioning with good results with dizziness 8/10 down to 2/10 after treatment.  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  Pt was given progression of exercises.  Called Dr. Benjamine Mola to update that pt can go home with RW.  Would benefit from Outpt PT but pt declines due to no insurance.  Pt will benefit from skilled PT to increase their independence and safety with mobility to allow discharge to the venue listed below.      Follow Up Recommendations No PT follow up (No insurance therefore declines therapy)    Equipment Recommendations  Rolling walker with 5" wheels    Recommendations for Other Services       Precautions / Restrictions Precautions Precautions: Fall Restrictions Weight Bearing Restrictions: No      Mobility  Bed Mobility Overal bed mobility: Independent                Transfers Overall transfer level: Independent                  Ambulation/Gait Ambulation/Gait assistance: Min guard Ambulation Distance (Feet): 150  Feet Assistive device: Rolling walker (2 wheeled) Gait Pattern/deviations: Step-through pattern;Decreased stride length;Drifts right/left   Gait velocity interpretation: Below normal speed for age/gender General Gait Details: Pt was able to ambulate in halls with cues for safety and needed RW as well.  Pt did not have significant LOB but occasionally did steady pt when she got a wave of vertigo.  Pt given compensation strategies to deal with these waves.    Stairs            Wheelchair Mobility    Modified Rankin (Stroke Patients Only)       Balance Overall balance assessment: Needs assistance Sitting-balance support: No upper extremity supported;Feet supported Sitting balance-Leahy Scale: Good     Standing balance support: Bilateral upper extremity supported;During functional activity Standing balance-Leahy Scale: Poor Standing balance comment: relies on UE support.                              Pertinent Vitals/Pain Pain Assessment: No/denies pain    Home Living Family/patient expects to be discharged to:: Private residence Living Arrangements: Spouse/significant other Available Help at Discharge: Family;Available 24 hours/day Type of Home: House Home Access: Level entry     Home Layout: One level Home Equipment: None      Prior Function Level of Independence: Independent               Hand Dominance  Extremity/Trunk Assessment   Upper Extremity Assessment Upper Extremity Assessment: Defer to OT evaluation    Lower Extremity Assessment Lower Extremity Assessment: Generalized weakness    Cervical / Trunk Assessment Cervical / Trunk Assessment: Normal  Communication   Communication: No difficulties  Cognition Arousal/Alertness: Awake/alert Behavior During Therapy: Flat affect Overall Cognitive Status: Within Functional Limits for tasks assessed                                        General Comments  General comments (skin integrity, edema, etc.): Pt with positive left BPPV and treated with canalith repositioning maneuver.  Pt symptoms intiially 8/10 and down to 2/10 after repositioning.  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  Gave pt x1 exercises as well.     Exercises Other Exercises Other Exercises: Kylie Baker, x1 exercises; Reviewed all handouts and perfomed exercises.  Discussed progression of exercises with pt as well.    Assessment/Plan    PT Assessment Patient needs continued PT services  PT Problem List Decreased activity tolerance;Decreased balance;Decreased mobility;Decreased safety awareness;Decreased knowledge of use of DME;Decreased knowledge of precautions (vertigo, BPPV)       PT Treatment Interventions DME instruction;Gait training;Functional mobility training;Therapeutic activities;Therapeutic exercise;Balance training;Patient/family education (vestibular rehab)    PT Goals (Current goals can be found in the Care Plan section)  Acute Rehab PT Goals Patient Stated Goal: to go home today PT Goal Formulation: With patient Time For Goal Achievement: 08/06/17 Potential to Achieve Goals: Good    Frequency Min 3X/week   Barriers to discharge        Co-evaluation               AM-PAC PT "6 Clicks" Daily Activity  Outcome Measure Difficulty turning over in bed (including adjusting bedclothes, sheets and blankets)?: None Difficulty moving from lying on back to sitting on the side of the bed? : None Difficulty sitting down on and standing up from a chair with arms (e.g., wheelchair, bedside commode, etc,.)?: None Help needed moving to and from a bed to chair (including a wheelchair)?: A Little Help needed walking in hospital room?: A Little Help needed climbing 3-5 steps with a railing? : A Little 6 Click Score: 21    End of Session Equipment Utilized During Treatment: Gait belt Activity Tolerance: Patient limited by fatigue Patient  left: in chair;with call bell/phone within reach;with family/visitor present Nurse Communication: Mobility status PT Visit Diagnosis: Unsteadiness on feet (R26.81);BPPV;Dizziness and giddiness (R42) BPPV - Right/Left : Left    Time: 1610-9604 PT Time Calculation (min) (ACUTE ONLY): 45 min   Charges:   PT Evaluation $PT Eval Moderate Complexity: 1 Mod PT Treatments $Gait Training: 8-22 mins $Canalith Rep Proc: 8-22 mins   PT G Codes:        Atom Solivan,PT Acute Rehabilitation [phone] (pager)   Berline Lopes 07/23/2017, 10:59 AM

## 2017-07-23 NOTE — Care Management Note (Signed)
Case Management Note Donn Pierini RN, BSN Unit 4E-Case Manager [phone]  Patient Details  Name: Kylie Baker MRN: [id number] Date of Birth: October 06, 1976  Subjective/Objective:   Pt admitted with syncope- secondary to symptomatic anemia from miscarriage                  Action/Plan: PTA pt lived at home with spouse- independent- anticipate return home- has PCP- Dr. Chauncey Fischer-- CM will follow  Expected Discharge Date:                  Expected Discharge Plan:  Home/Self Care  In-House Referral:     Discharge planning Services  CM Consult  Post Acute Care Choice:  Durable Medical Equipment Choice offered to:  Patient  DME Arranged:  Dan Humphreys rolling DME Agency:  [facility]  HH Arranged:  NA HH Agency:  NA  Status of Service:  Completed, signed off  If discussed at Long Length of Stay Meetings, dates discussed:    Discharge Disposition: home/self care   Additional Comments:  07/23/17- 1140- Hussien Greenblatt RN, CM- per PT eval- pt will need RW for home- order placed- notified Jermaine with [facility] for DME need- RW to be delivered to room prior to discharge.   Darrold Span, RN 07/23/2017, 11:40 AM

## 2017-07-23 NOTE — Progress Notes (Signed)
Patient walked for about 171ft but very dizzy.

## 2017-07-23 NOTE — Progress Notes (Signed)
Patient attempted to walk but got very dizzy. Patient wants to walk but each time almost lost balance from being dizzy.

## 2017-07-24 ENCOUNTER — Inpatient Hospital Stay (HOSPITAL_COMMUNITY): Payer: Self-pay | Admitting: Anesthesiology

## 2017-07-24 ENCOUNTER — Encounter (HOSPITAL_COMMUNITY)
Admission: AD | Disposition: A | Discharge status: Home / Self Care | Payer: Self-pay | Source: Ambulatory Visit | Attending: Obstetrics & Gynecology

## 2017-07-24 ENCOUNTER — Inpatient Hospital Stay (HOSPITAL_COMMUNITY)
Admission: AD | Admit: 2017-07-24 | Discharge: 2017-07-24 | Disposition: A | Discharge status: Home / Self Care | Payer: Self-pay | Source: Ambulatory Visit | Attending: Obstetrics and Gynecology | Admitting: Obstetrics and Gynecology

## 2017-07-24 ENCOUNTER — Other Ambulatory Visit: Payer: Self-pay | Admitting: Obstetrics and Gynecology

## 2017-07-24 ENCOUNTER — Encounter (HOSPITAL_COMMUNITY): Payer: Self-pay | Admitting: *Deleted

## 2017-07-24 DIAGNOSIS — O031 Delayed or excessive hemorrhage following incomplete spontaneous abortion: Secondary | ICD-10-CM | POA: Diagnosis present

## 2017-07-24 DIAGNOSIS — D649 Anemia, unspecified: Secondary | ICD-10-CM | POA: Insufficient documentation

## 2017-07-24 DIAGNOSIS — O021 Missed abortion: Secondary | ICD-10-CM

## 2017-07-24 DIAGNOSIS — Z7984 Long term (current) use of oral hypoglycemic drugs: Secondary | ICD-10-CM | POA: Insufficient documentation

## 2017-07-24 DIAGNOSIS — O4690 Antepartum hemorrhage, unspecified, unspecified trimester: Secondary | ICD-10-CM

## 2017-07-24 DIAGNOSIS — K219 Gastro-esophageal reflux disease without esophagitis: Secondary | ICD-10-CM | POA: Insufficient documentation

## 2017-07-24 DIAGNOSIS — Z6841 Body Mass Index (BMI) 40.0 and over, adult: Secondary | ICD-10-CM | POA: Insufficient documentation

## 2017-07-24 DIAGNOSIS — E0590 Thyrotoxicosis, unspecified without thyrotoxic crisis or storm: Secondary | ICD-10-CM | POA: Insufficient documentation

## 2017-07-24 DIAGNOSIS — O034 Incomplete spontaneous abortion without complication: Secondary | ICD-10-CM | POA: Insufficient documentation

## 2017-07-24 DIAGNOSIS — Z79899 Other long term (current) drug therapy: Secondary | ICD-10-CM | POA: Insufficient documentation

## 2017-07-24 DIAGNOSIS — E119 Type 2 diabetes mellitus without complications: Secondary | ICD-10-CM | POA: Insufficient documentation

## 2017-07-24 HISTORY — PX: DILATION AND EVACUATION: SHX1459

## 2017-07-24 LAB — TYPE AND SCREEN
ABO/RH(D): B POS
Antibody Screen: NEGATIVE

## 2017-07-24 LAB — CBC
HCT: 31.7 % — ABNORMAL LOW (ref 36.0–46.0)
Hemoglobin: 10.1 g/dL — ABNORMAL LOW (ref 12.0–15.0)
MCH: 25.8 pg — AB (ref 26.0–34.0)
MCHC: 31.9 g/dL (ref 30.0–36.0)
MCV: 81.1 fL (ref 78.0–100.0)
PLATELETS: 156 10*3/uL (ref 150–400)
RBC: 3.91 MIL/uL (ref 3.87–5.11)
RDW: 16.7 % — ABNORMAL HIGH (ref 11.5–15.5)
WBC: 9.3 10*3/uL (ref 4.0–10.5)

## 2017-07-24 SURGERY — DILATION AND EVACUATION, UTERUS
Anesthesia: General | Site: Perineum

## 2017-07-24 MED ORDER — MISOPROSTOL 200 MCG PO TABS: 800.0000 ug | ORAL_TABLET | Freq: Once | ORAL | Status: DC

## 2017-07-24 MED ORDER — PROPOFOL 10 MG/ML IV BOLUS
INTRAVENOUS | Status: AC
  Filled 2017-07-24: qty 20

## 2017-07-24 MED ORDER — LIDOCAINE HCL (CARDIAC) 20 MG/ML IV SOLN
INTRAVENOUS | Status: DC | PRN
  Administered 2017-07-24: 80 mg via INTRAVENOUS

## 2017-07-24 MED ORDER — PROPOFOL 10 MG/ML IV BOLUS
INTRAVENOUS | Status: DC | PRN
  Administered 2017-07-24: 160 mg via INTRAVENOUS

## 2017-07-24 MED ORDER — SCOPOLAMINE 1 MG/3DAYS TD PT72
MEDICATED_PATCH | TRANSDERMAL | Status: DC | PRN
  Administered 2017-07-24: 1 via TRANSDERMAL

## 2017-07-24 MED ORDER — OXYTOCIN 40 UNITS IN LACTATED RINGERS INFUSION - SIMPLE MED
INTRAVENOUS | Status: AC
  Filled 2017-07-24: qty 1000

## 2017-07-24 MED ORDER — LIDOCAINE HCL (CARDIAC) 20 MG/ML IV SOLN
INTRAVENOUS | Status: AC
  Filled 2017-07-24: qty 5

## 2017-07-24 MED ORDER — MIDAZOLAM HCL 2 MG/2ML IJ SOLN
INTRAMUSCULAR | Status: AC
  Filled 2017-07-24: qty 2

## 2017-07-24 MED ORDER — ONDANSETRON HCL 4 MG/2ML IJ SOLN
INTRAMUSCULAR | Status: DC | PRN
  Administered 2017-07-24: 4 mg via INTRAVENOUS

## 2017-07-24 MED ORDER — MIDAZOLAM HCL 2 MG/2ML IJ SOLN
INTRAMUSCULAR | Status: DC | PRN
  Administered 2017-07-24: 2 mg via INTRAVENOUS

## 2017-07-24 MED ORDER — FENTANYL CITRATE (PF) 100 MCG/2ML IJ SOLN
INTRAMUSCULAR | Status: AC
  Filled 2017-07-24: qty 2

## 2017-07-24 MED ORDER — FAMOTIDINE IN NACL 20-0.9 MG/50ML-% IV SOLN
20.0000 mg | Freq: Once | INTRAVENOUS | Status: AC
  Administered 2017-07-24: 20 mg via INTRAVENOUS
  Filled 2017-07-24: qty 50

## 2017-07-24 MED ORDER — LACTATED RINGERS IV SOLN
INTRAVENOUS | Status: DC | PRN
  Administered 2017-07-24: 19:00:00 via INTRAVENOUS

## 2017-07-24 MED ORDER — SOD CITRATE-CITRIC ACID 500-334 MG/5ML PO SOLN
30.0000 mL | Freq: Once | ORAL | Status: AC
  Administered 2017-07-24: 30 mL via ORAL
  Filled 2017-07-24: qty 15

## 2017-07-24 MED ORDER — IBUPROFEN 600 MG PO TABS: 600.0000 mg | ORAL_TABLET | Freq: Four times a day (QID) | ORAL | 1 refills | Status: DC | PRN

## 2017-07-24 MED ORDER — ONDANSETRON HCL 4 MG/2ML IJ SOLN
INTRAMUSCULAR | Status: AC
  Filled 2017-07-24: qty 2

## 2017-07-24 MED ORDER — ONDANSETRON HCL 4 MG/2ML IJ SOLN: 4.0000 mg | Freq: Once | INTRAMUSCULAR | Status: DC | PRN

## 2017-07-24 MED ORDER — MEPERIDINE HCL 25 MG/ML IJ SOLN: 6.2500 mg | INTRAMUSCULAR | Status: DC | PRN

## 2017-07-24 MED ORDER — FENTANYL CITRATE (PF) 100 MCG/2ML IJ SOLN
INTRAMUSCULAR | Status: DC | PRN
  Administered 2017-07-24: 100 ug via INTRAVENOUS

## 2017-07-24 MED ORDER — OXYTOCIN 40 UNITS IN LACTATED RINGERS INFUSION - SIMPLE MED: 1.0000 m[IU]/min | Freq: Once | INTRAVENOUS | Status: DC

## 2017-07-24 MED ORDER — FENTANYL CITRATE (PF) 100 MCG/2ML IJ SOLN: 25.0000 ug | INTRAMUSCULAR | Status: DC | PRN

## 2017-07-24 MED ORDER — SCOPOLAMINE 1 MG/3DAYS TD PT72
MEDICATED_PATCH | TRANSDERMAL | Status: AC
  Filled 2017-07-24: qty 1

## 2017-07-24 SURGICAL SUPPLY — 19 items
CATH ROBINSON RED A/P 16FR (CATHETERS) ×3 IMPLANT
DECANTER SPIKE VIAL GLASS SM (MISCELLANEOUS) IMPLANT
GLOVE BIOGEL PI IND STRL 7.0 (GLOVE) ×1 IMPLANT
GLOVE BIOGEL PI INDICATOR 7.0 (GLOVE) ×2
GLOVE ECLIPSE 9.0 STRL (GLOVE) ×6 IMPLANT
GOWN STRL REUS W/TWL 2XL LVL3 (GOWN DISPOSABLE) ×3 IMPLANT
GOWN STRL REUS W/TWL LRG LVL3 (GOWN DISPOSABLE) ×3 IMPLANT
KIT BERKELEY 1ST TRIMESTER 3/8 (MISCELLANEOUS) ×3 IMPLANT
NS IRRIG 1000ML POUR BTL (IV SOLUTION) ×3 IMPLANT
PACK VAGINAL MINOR WOMEN LF (CUSTOM PROCEDURE TRAY) ×3 IMPLANT
PAD OB MATERNITY 4.3X12.25 (PERSONAL CARE ITEMS) ×3 IMPLANT
PAD PREP 24X48 CUFFED NSTRL (MISCELLANEOUS) ×3 IMPLANT
SET BERKELEY SUCTION TUBING (SUCTIONS) ×3 IMPLANT
TOWEL OR 17X24 6PK STRL BLUE (TOWEL DISPOSABLE) ×6 IMPLANT
VACURETTE 10 RIGID CVD (CANNULA) ×3 IMPLANT
VACURETTE 12 RIGID CVD (CANNULA) IMPLANT
VACURETTE 7MM CVD STRL WRAP (CANNULA) IMPLANT
VACURETTE 8 RIGID CVD (CANNULA) IMPLANT
VACURETTE 9 RIGID CVD (CANNULA) IMPLANT

## 2017-07-24 NOTE — MAU Provider Note (Signed)
History     CSN: [id number]  Arrival date and time: 07/24/17 1653   First Provider Initiated Contact with Patient 07/24/17 1738      Chief Complaint  Patient presents with  . Abdominal Pain  . Back Pain  . Vaginal Bleeding   [id number] s/p 6 wk SAB here with VB. She was admitted 4 days ago with syncope and symptomatic anemia from bleeding and was discharged yesterday (transfused 3 U PRBCs-see discharge summary). Since being discharged bleeding was light but around 1600 today she began bleeding heavy and abdominal cramping started.    Past Medical History:  Diagnosis Date  . Asthma   . Gestational diabetes 01/25/2012  . Pregnancy induced hypertension   . Thyroid disease   . Type 2 diabetes mellitus affecting pregnancy in first trimester, antepartum 07/11/2017    Past Surgical History:  Procedure Laterality Date  . APPENDECTOMY  1980's  . CESAREAN SECTION  06/06/2010  . CESAREAN SECTION  03/27/2012   Procedure: CESAREAN SECTION;  Surgeon: Kathreen Cosier, MD;  Location: [facility] ORS;  Service: Gynecology;  Laterality: N/A;  Repeat Cesarean Section Delivery   Boy  @ 0005 , Apgars 8/9  . CHOLECYSTECTOMY  04/22/2012   Procedure: LAPAROSCOPIC CHOLECYSTECTOMY WITH INTRAOPERATIVE CHOLANGIOGRAM;  Surgeon: Mariella Saa, MD;  Location: [facility] ORS;  Service: General;  Laterality: N/A;  . THYROIDECTOMY    . UMBILICAL HERNIA REPAIR  09/07/2012   Procedure: HERNIA REPAIR UMBILICAL ADULT;  Surgeon: Adolph Pollack, MD;  Location: [facility] ORS;  Service: General;  Laterality: N/A;  Repair of Incisional Incarcarated hernia    Family History  Problem Relation Age of Onset  . Hypertension Mother   . Diabetes Father   . Asthma Father   . Hypertension Father   . Anesthesia problems Neg Hx     Social History  Substance Use Topics  . Smoking status: Never Smoker  . Smokeless tobacco: Never Used  . Alcohol use No    Allergies:  Allergies  Allergen Reactions  . Aspirin Itching and Rash     Prescriptions Prior to Admission  Medication Sig Dispense Refill Last Dose  . ferrous sulfate 325 (65 FE) MG tablet Take 1 tablet (325 mg total) by mouth daily. 30 tablet 0 07/19/2017 at Unknown time  . levothyroxine (SYNTHROID, LEVOTHROID) 125 MCG tablet Take 125 mcg by mouth daily before breakfast.   07/19/2017 at Unknown time  . metFORMIN (GLUCOPHAGE) 850 MG tablet Take 850 mg by mouth 2 (two) times daily with a meal.   07/18/2017    Review of Systems  Gastrointestinal: Positive for abdominal pain.  Genitourinary: Positive for vaginal bleeding.   Physical Exam   Blood pressure (!) 140/94, pulse (!) 101, temperature 98.5 F (36.9 C), temperature source Oral, resp. rate 18, weight 232 lb 8 oz (105.5 kg), last menstrual period 05/07/2017, SpO2 100 %, unknown if currently breastfeeding.  Physical Exam  Constitutional: She is oriented to person, place, and time. She appears well-developed and well-nourished. No distress.  HENT:  Head: Normocephalic and atraumatic.  Neck: Normal range of motion.  Respiratory: Effort normal. No respiratory distress.  Genitourinary:  Genitourinary Comments: External: no lesions or erythema Vagina: rugated, pink, moist, several quarter sized clots removed from vagina and os then brisk bleeding ensued     Musculoskeletal: Normal range of motion.  Neurological: She is alert and oriented to person, place, and time.  Skin: Skin is warm and dry.  Psychiatric: She has a normal mood and affect.  MAU Course  Procedures  MDM Labs ordered. Dr. Emelda Fear consulted. Dr. Emelda Fear in to examine pt and plan for [facility] in OR. Pitocin and IV ordered.   Assessment and Plan  Incomplete abortion Acute blood loss Admit To OR  Donette Larry 07/24/2017, 6:00 PM

## 2017-07-24 NOTE — H&P (Signed)
History   CSN: [id number]  Arrival date and time: 07/24/17 1653   First Provider Initiated Contact with Patient 07/24/17 1738         Chief Complaint  Patient presents with  . Abdominal Pain  . Back Pain  . Vaginal Bleeding   [id number] s/p 6 wk SAB here with VB. She was admitted 4 days ago with syncope and symptomatic anemia from bleeding and was discharged yesterday (transfused 3 U PRBCs-see discharge summary). Since being discharged bleeding was light but around 1600 today she began bleeding heavy and abdominal cramping started.       Past Medical History:  Diagnosis Date  . Asthma   . Gestational diabetes 01/25/2012  . Pregnancy induced hypertension   . Thyroid disease   . Type 2 diabetes mellitus affecting pregnancy in first trimester, antepartum 07/11/2017         Past Surgical History:  Procedure Laterality Date  . APPENDECTOMY  1980's  . CESAREAN SECTION  06/06/2010  . CESAREAN SECTION  03/27/2012   Procedure: CESAREAN SECTION;  Surgeon: Kathreen Cosier, MD;  Location: [facility];  Service: Gynecology;  Laterality: N/A;  Repeat Cesarean Section Delivery   Boy  @ 0005 , Apgars 8/9  . CHOLECYSTECTOMY  04/22/2012   Procedure: LAPAROSCOPIC CHOLECYSTECTOMY WITH INTRAOPERATIVE CHOLANGIOGRAM;  Surgeon: Mariella Saa, MD;  Location: [facility];  Service: General;  Laterality: N/A;  . THYROIDECTOMY    . UMBILICAL HERNIA REPAIR  09/07/2012   Procedure: HERNIA REPAIR UMBILICAL ADULT;  Surgeon: Adolph Pollack, MD;  Location: [facility];  Service: General;  Laterality: N/A;  Repair of Incisional Incarcarated hernia         Family History  Problem Relation Age of Onset  . Hypertension Mother   . Diabetes Father   . Asthma Father   . Hypertension Father   . Anesthesia problems Neg Hx         Social History  Substance Use Topics  . Smoking status: Never Smoker  . Smokeless tobacco: Never Used  . Alcohol use No    Allergies:       Allergies  Allergen Reactions  . Aspirin Itching and Rash           Prescriptions Prior to Admission  Medication Sig Dispense Refill Last Dose  . ferrous sulfate 325 (65 FE) MG tablet Take 1 tablet (325 mg total) by mouth daily. 30 tablet 0 07/19/2017 at Unknown time  . levothyroxine (SYNTHROID, LEVOTHROID) 125 MCG tablet Take 125 mcg by mouth daily before breakfast.   07/19/2017 at Unknown time  . metFORMIN (GLUCOPHAGE) 850 MG tablet Take 850 mg by mouth 2 (two) times daily with a meal.   07/18/2017    Review of Systems  Gastrointestinal: Positive for abdominal pain.  Genitourinary: Positive for vaginal bleeding.   Physical Exam   Blood pressure (!) 140/94, pulse (!) 101, temperature 98.5 F (36.9 C), temperature source Oral, resp. rate 18, weight 232 lb 8 oz (105.5 kg), last menstrual period 05/07/2017, SpO2 100 %, unknown if currently breastfeeding.  Physical Exam  Constitutional: She is oriented to person, place, and time. She appears well-developed and well-nourished. No distress.  HENT:  Head: Normocephalic and atraumatic.  Neck: Normal range of motion.  Respiratory: Effort normal. No respiratory distress.  Genitourinary:  Genitourinary Comments: External: no lesions or erythema Vagina: rugated, pink, moist, several quarter sized clots removed from vagina and os then brisk bleeding ensued     Musculoskeletal: Normal range of  motion.  Neurological: She is alert and oriented to person, place, and time.  Skin: Skin is warm and dry.  Psychiatric: She has a normal mood and affect.    MAU Course  Procedures  MDM Labs ordered. Dr. Emelda Fear consulted. Dr. Emelda Fear in to examine pt and plan for [facility] in OR. Pitocin and IV ordered.   Assessment and Plan  Incomplete abortion Acute blood loss Admit To OR  Donette Larry 07/24/2017, 6:00 PM     Electronically signed by Donette Larry, CNM at 07/24/2017 6:26 PM Electronically signed by Donette Larry,  CNM at 07/24/2017 6:27 PM     `````Attestation of Attending Supervision of Advanced Practitioner: Evaluation and management procedures were performed by the PA/NP/CNM/OB Fellow under my supervision/collaboration. Chart reviewed and agree with management and plan.  Tilda Burrow 07/24/2017 7:27 PM   `````Attestation of Attending Supervision of Advanced Practitioner: Evaluation and management procedures were performed by the PA/NP/CNM/OB Fellow under my supervision/collaboration I did exam on the patient and determined that surgery was needed, EBl at exam 400cc, Consented for surgery including risks alternatives, likely outcomes and potential complications , up to and including need for hysterectomy discussed with the patient.. Chart reviewed and agree with management and plan.  Ronav Furney V 07/24/2017 7:27 PM

## 2017-07-24 NOTE — Op Note (Signed)
Please see the brief op note for details of procedure.

## 2017-07-24 NOTE — Transfer of Care (Signed)
Immediate Anesthesia Transfer of Care Note  Patient: Kylie Baker  Procedure(s) Performed: DILATATION AND EVACUATION (N/A Perineum)  Patient Location: PACU  Anesthesia Type:General  Level of Consciousness: awake, alert  and oriented  Airway & Oxygen Therapy: Patient Spontanous Breathing and Patient connected to nasal cannula oxygen  Post-op Assessment: Report given to RN and Post -op Vital signs reviewed and stable  Post vital signs: Reviewed and stable  Last Vitals:  Vitals:   07/24/17 1708 07/24/17 1711  BP:  (!) 140/94  Pulse:  (!) 101  Resp:  18  Temp:  36.9 C  SpO2: 100% 100%    Last Pain:  Vitals:   07/24/17 1711  TempSrc: Oral  PainSc:          Complications: No apparent anesthesia complications

## 2017-07-24 NOTE — MAU Note (Signed)
Per [place] 10/2, IUGS no longer present, ? Retained POC. Was released from hospital yesterday. [facility]).  States is bleeding heavier today.cramping in lower abd and back.

## 2017-07-24 NOTE — Progress Notes (Signed)
Dr Emelda Fear performed sterile speculum removed large clots, packed vagina with 4 - 4x4s.

## 2017-07-24 NOTE — Anesthesia Preprocedure Evaluation (Signed)
Anesthesia Evaluation  Patient identified by MRN, date of birth, ID band Patient awake  General Assessment Comment:4 weeks post partum  Reviewed: Allergy & Precautions, H&P , NPO status , Patient's Chart, lab work & pertinent test results  Airway Mallampati: III  TM Distance: <3 FB Neck ROM: Full    Dental no notable dental hx. (+) Teeth Intact, Dental Advisory Given   Pulmonary neg pulmonary ROS, asthma ,    Pulmonary exam normal breath sounds clear to auscultation       Cardiovascular hypertension, Pt. on medications  Rhythm:Regular Rate:Normal     Neuro/Psych negative neurological ROS  negative psych ROS   GI/Hepatic Neg liver ROS, GERD  Medicated,  Endo/Other  diabetesHyperthyroidism Morbid obesity  Renal/GU negative Renal ROS  negative genitourinary   Musculoskeletal negative musculoskeletal ROS (+)   Abdominal   Peds negative pediatric ROS (+)  Hematology negative hematology ROS (+) Blood dyscrasia, anemia ,   Anesthesia Other Findings Echo 10/18 - Normal study.   Reproductive/Obstetrics negative OB ROS                             Anesthesia Physical  Anesthesia Plan  ASA: III and emergent  Anesthesia Plan: General   Post-op Pain Management:    Induction: Intravenous  PONV Risk Score and Plan: 2 and Ondansetron, Dexamethasone, Treatment may vary due to age or medical condition and Midazolam  Airway Management Planned: Oral ETT and LMA  Additional Equipment:   Intra-op Plan:   Post-operative Plan: Extubation in OR  Informed Consent: I have reviewed the patients History and Physical, chart, labs and discussed the procedure including the risks, benefits and alternatives for the proposed anesthesia with the patient or authorized representative who has indicated his/her understanding and acceptance.   Dental advisory given  Plan Discussed with: CRNA  Anesthesia Plan  Comments: ( )        Anesthesia Quick Evaluation

## 2017-07-24 NOTE — Anesthesia Procedure Notes (Signed)
Procedure Name: LMA Insertion Date/Time: 07/24/2017 7:27 PM Performed by: Other Atienza, Jannet Askew Pre-anesthesia Checklist: Patient identified, Emergency Drugs available, Suction available and Patient being monitored Patient Re-evaluated:Patient Re-evaluated prior to induction Oxygen Delivery Method: Circle system utilized Preoxygenation: Pre-oxygenation with 100% oxygen Induction Type: IV induction LMA: LMA inserted LMA Size: 4.0 Number of attempts: 1 Placement Confirmation: positive ETCO2 and breath sounds checked- equal and bilateral ETT to lip (cm): at lip. Dental Injury: Teeth and Oropharynx as per pre-operative assessment

## 2017-07-24 NOTE — Brief Op Note (Signed)
07/24/2017  8:00 PM  PATIENT:  Kylie Baker  42 y.o. female  PRE-OPERATIVE DIAGNOSIS:  missed Abortion  POST-OPERATIVE DIAGNOSIS:  missed Abortion  PROCEDURE:  Procedure(s): DILATATION AND EVACUATION (N/A)  SURGEON:  Surgeon(s) and Role:    * Tilda Burrow, MD - Primary  PHYSICIAN ASSISTANT:   ASSISTANTS: none   ANESTHESIA:   general  EBL:  Total I/O In: 500 [I.V.:500] Out: 75 [Urine:50; Blood:25]  BLOOD ADMINISTERED:none  DRAINS: none   LOCAL MEDICATIONS USED:  NONE  SPECIMEN:  Source of Specimen:  products of conception  DISPOSITION OF SPECIMEN:  PATHOLOGY  COUNTS:  YES  TOURNIQUET:  * No tourniquets in log *  DICTATION: .Dragon Dictation  PLAN OF CARE: Discharge to home after PACU  PATIENT DISPOSITION:  PACU - hemodynamically stable.   Delay start of Pharmacological VTE agent (>24hrs) due to surgical blood loss or risk of bleeding: not applicable Details of procedure: Patient was taken operating room prepped and draped for vaginal procedure. Timeout was conducted. Surgical procedure confirmed by operative team. Speculum was inserted cervix grasped with single-tooth tenaculum sounded in the anteflexed position to 11 cm, and the uterus was dilated sufficiently that the suction curet 10 mm diameter could be placed without difficulty and suction applied at 50 mmHg suction pressure removing blood clots and tissue fragments that is actually evacuating uterine cavity. Smooth Sharp curettage confirmed satisfactory uterine evacuation. Patient to recovery in stable condition

## 2017-07-25 NOTE — Anesthesia Postprocedure Evaluation (Signed)
Anesthesia Post Note  Patient: Kylie Baker  Procedure(s) Performed: DILATATION AND EVACUATION (N/A Perineum)     Patient location during evaluation: PACU Anesthesia Type: General Level of consciousness: awake and alert Pain management: pain level controlled Vital Signs Assessment: post-procedure vital signs reviewed and stable Respiratory status: spontaneous breathing, nonlabored ventilation, respiratory function stable and patient connected to nasal cannula oxygen Cardiovascular status: blood pressure returned to baseline and stable Postop Assessment: no apparent nausea or vomiting Anesthetic complications: no    Last Vitals:  Vitals:   07/24/17 2045 07/24/17 2115  BP: 132/90 135/81  Pulse: 88 90  Resp: 15 16  Temp:  37 C  SpO2: 98% 99%    Last Pain:  Vitals:   07/24/17 2115  TempSrc:   PainSc: 0-No pain   Pain Goal:                 [name]

## 2017-07-26 ENCOUNTER — Encounter (HOSPITAL_COMMUNITY): Payer: Self-pay | Admitting: Obstetrics and Gynecology

## 2017-07-27 LAB — GLUCOSE, CAPILLARY: GLUCOSE-CAPILLARY: 98 mg/dL (ref 65–99)

## 2017-07-28 ENCOUNTER — Encounter: Payer: No Typology Code available for payment source | Admitting: Obstetrics

## 2017-11-03 ENCOUNTER — Emergency Department (HOSPITAL_BASED_OUTPATIENT_CLINIC_OR_DEPARTMENT_OTHER): Payer: Self-pay

## 2017-11-03 ENCOUNTER — Encounter (HOSPITAL_BASED_OUTPATIENT_CLINIC_OR_DEPARTMENT_OTHER): Payer: Self-pay

## 2017-11-03 ENCOUNTER — Emergency Department (HOSPITAL_BASED_OUTPATIENT_CLINIC_OR_DEPARTMENT_OTHER)
Admission: EM | Admit: 2017-11-03 | Discharge: 2017-11-03 | Disposition: A | Discharge status: Home / Self Care | Payer: Self-pay | Attending: Emergency Medicine | Admitting: Emergency Medicine

## 2017-11-03 ENCOUNTER — Other Ambulatory Visit: Payer: Self-pay

## 2017-11-03 DIAGNOSIS — J45909 Unspecified asthma, uncomplicated: Secondary | ICD-10-CM | POA: Insufficient documentation

## 2017-11-03 DIAGNOSIS — E119 Type 2 diabetes mellitus without complications: Secondary | ICD-10-CM | POA: Insufficient documentation

## 2017-11-03 DIAGNOSIS — M7712 Lateral epicondylitis, left elbow: Secondary | ICD-10-CM | POA: Insufficient documentation

## 2017-11-03 DIAGNOSIS — Z79899 Other long term (current) drug therapy: Secondary | ICD-10-CM | POA: Insufficient documentation

## 2017-11-03 LAB — CBC WITH DIFFERENTIAL/PLATELET
Basophils Absolute: 0 10*3/uL (ref 0.0–0.1)
Basophils Relative: 0 %
Eosinophils Absolute: 0.1 10*3/uL (ref 0.0–0.7)
Eosinophils Relative: 2 %
HCT: 36.5 % (ref 36.0–46.0)
Hemoglobin: 11.5 g/dL — ABNORMAL LOW (ref 12.0–15.0)
Lymphocytes Relative: 31 %
Lymphs Abs: 2 10*3/uL (ref 0.7–4.0)
MCH: 22.5 pg — ABNORMAL LOW (ref 26.0–34.0)
MCHC: 31.5 g/dL (ref 30.0–36.0)
MCV: 71.3 fL — ABNORMAL LOW (ref 78.0–100.0)
Monocytes Absolute: 0.3 10*3/uL (ref 0.1–1.0)
Monocytes Relative: 5 %
Neutro Abs: 4.1 10*3/uL (ref 1.7–7.7)
Neutrophils Relative %: 62 %
Platelets: 177 10*3/uL (ref 150–400)
RBC: 5.12 MIL/uL — ABNORMAL HIGH (ref 3.87–5.11)
RDW: 21.2 % — ABNORMAL HIGH (ref 11.5–15.5)
WBC: 6.5 10*3/uL (ref 4.0–10.5)

## 2017-11-03 LAB — BASIC METABOLIC PANEL
Anion gap: 7 (ref 5–15)
BUN: 8 mg/dL (ref 6–20)
CO2: 28 mmol/L (ref 22–32)
Calcium: 8.7 mg/dL — ABNORMAL LOW (ref 8.9–10.3)
Chloride: 102 mmol/L (ref 101–111)
Creatinine, Ser: 0.65 mg/dL (ref 0.44–1.00)
GFR calc Af Amer: >60 mL/min (ref >60)
GFR calc non Af Amer: >60 mL/min (ref >60)
Glucose, Bld: 178 mg/dL — ABNORMAL HIGH (ref 65–99)
Potassium: 3.6 mmol/L (ref 3.5–5.1)
Sodium: 137 mmol/L (ref 135–145)

## 2017-11-03 LAB — SEDIMENTATION RATE: Sed Rate: 15 mm/hr (ref 0–22)

## 2017-11-03 LAB — C-REACTIVE PROTEIN: CRP: 1.1 mg/dL — ABNORMAL HIGH (ref <1.0)

## 2017-11-03 LAB — PREGNANCY, URINE: Preg Test, Ur: NEGATIVE

## 2017-11-03 NOTE — ED Triage Notes (Signed)
Pt reports left arm pain for several weeks. Pt reports last night she could not sleep because of the pain. Pt reports a miscarriage back in September that required a blood transfusion.

## 2017-11-03 NOTE — ED Provider Notes (Signed)
[facility] EMERGENCY DEPARTMENT Provider Note   CSN: [id number] Arrival date & time: 11/03/17  1019     History   Chief Complaint Chief Complaint  Patient presents with  . Left Arm Pain    HPI Kylie Baker is a 42 y.o. female.  42yo F w/ PMH including T2DM, asthma who p/w L elbow pain.  The patient has had several weeks of gradually worsening left elbow pain that is worse with movements of her elbow particularly supination and pronation of her arm.  No numbness or weakness of her hand.  No trauma, change in physical activity, or heavy lifting preceding onset of pain.  He has tried Tylenol and ibuprofen without relief.  She tried massaging the muscle but it made her pain worse.   The history is provided by the patient.    Past Medical History:  Diagnosis Date  . Asthma   . Gestational diabetes 01/25/2012  . Pregnancy induced hypertension   . Thyroid disease   . Type 2 diabetes mellitus affecting pregnancy in first trimester, antepartum 07/11/2017    Patient Active Problem List   Diagnosis Date Noted  . Syncope 07/20/2017  . Symptomatic anemia 07/20/2017  . Incomplete abortion with delayed or excessive hemorrhage 07/20/2017  . Type 2 diabetes mellitus affecting pregnancy in first trimester, antepartum 07/11/2017  . Goiter, nodular 07/04/2015  . Vitamin D deficiency 03/27/2015  . Unspecified essential hypertension 04/13/2014  . Hypercalcemia 02/09/2014  . Subclinical hyperthyroidism 02/09/2014  . Incarcerated incisional hernia-s/p repair 09/07/12 10/05/2012  . Supervision of high-risk pregnancy 01/25/2012  . Gestational diabetes 01/25/2012  . Chronic hypertension complicating or reason for care during pregnancy 01/25/2012  . Asthma 01/25/2012    Past Surgical History:  Procedure Laterality Date  . APPENDECTOMY  1980's  . CESAREAN SECTION  06/06/2010  . CESAREAN SECTION  03/27/2012   Procedure: CESAREAN SECTION;  Surgeon: Frederico Hamman, MD;  Location:  [facility];  Service: Gynecology;  Laterality: N/A;  Repeat Cesarean Section Delivery   Boy  @ 0005 , Apgars 8/9  . CHOLECYSTECTOMY  04/22/2012   Procedure: LAPAROSCOPIC CHOLECYSTECTOMY WITH INTRAOPERATIVE CHOLANGIOGRAM;  Surgeon: Edward Jolly, MD;  Location: [facility];  Service: General;  Laterality: N/A;  . DILATION AND EVACUATION N/A 07/24/2017   Procedure: DILATATION AND EVACUATION;  Surgeon: Jonnie Kind, MD;  Location: [facility];  Service: Gynecology;  Laterality: N/A;  . THYROIDECTOMY    . UMBILICAL HERNIA REPAIR  09/07/2012   Procedure: HERNIA REPAIR UMBILICAL ADULT;  Surgeon: Odis Hollingshead, MD;  Location: [facility];  Service: General;  Laterality: N/A;  Repair of Incisional Incarcarated hernia    OB History    Gravida Para Term Preterm AB Living   _0 SAB TAB Ectopic Multiple Live Births   1 0 0 0 2       Home Medications    Prior to Admission medications   Medication Sig Start Date End Date Taking? Authorizing Provider  levothyroxine (SYNTHROID, LEVOTHROID) 125 MCG tablet Take 125 mcg by mouth daily before breakfast.   Yes [provider]  metFORMIN (GLUCOPHAGE) 850 MG tablet Take 850 mg by mouth 2 (two) times daily with a meal.   Yes [provider]  ferrous sulfate 325 (65 FE) MG tablet Take 1 tablet (325 mg total) by mouth daily. 09/26/16   Mabe, Forbes Cellar, MD  IBU 600 MG tablet TAKE 1 TABLET BY MOUTH EVERY 6 HOURS AS NEEDED 09/15/17  Jonnie Kind, MD    Family History Family History  Problem Relation Age of Onset  . Hypertension Mother   . Diabetes Father   . Asthma Father   . Hypertension Father   . Anesthesia problems Neg Hx     Social History Social History   Tobacco Use  . Smoking status: Never Smoker  . Smokeless tobacco: Never Used  Substance Use Topics  . Alcohol use: No  . Drug use: No     Allergies   Aspirin   Review of Systems Review of Systems  Constitutional: Negative for fever.  Musculoskeletal:  Positive for arthralgias. Negative for joint swelling.  Neurological: Negative for weakness and numbness.     Physical Exam Updated Vital Signs BP (!) 152/101 (BP Location: Right Arm)   Pulse 73   Temp 98.7 F (37.1 C) (Oral)   Resp 18   Ht _0  (1.6 m)   Wt 101.6 kg (224 lb)   LMP 10/17/2017   SpO2 100%   BMI 39.68 kg/m   Physical Exam  Constitutional: She is oriented to person, place, and time. She appears well-developed and well-nourished. No distress.  HENT:  Head: Normocephalic and atraumatic.  Eyes: Conjunctivae are normal.  Neck: Neck supple.  Cardiovascular: Intact distal pulses.  Musculoskeletal: She exhibits tenderness (left lateral epicondyle).  No obvious L elbow swelling or joint effusion, no warmth or redness, normal flexion/extension of elbow with pain during pronation/supination; normal grip strength  Neurological: She is alert and oriented to person, place, and time. No sensory deficit.  Skin: Skin is warm and dry.  Ecchymoses L anticubital fossa  Psychiatric: She has a normal mood and affect. Judgment normal.  Nursing note and vitals reviewed.    ED Treatments / Results  Labs (all labs ordered are listed, but only abnormal results are displayed) Labs Reviewed  CBC WITH DIFFERENTIAL/PLATELET - Abnormal; Notable for the following components:      Result Value   RBC 5.12 (*)    Hemoglobin 11.5 (*)    MCV 71.3 (*)    MCH 22.5 (*)    RDW 21.2 (*)    All other components within normal limits  BASIC METABOLIC PANEL - Abnormal; Notable for the following components:   Glucose, Bld 178 (*)    Calcium 8.7 (*)    All other components within normal limits  C-REACTIVE PROTEIN - Abnormal; Notable for the following components:   CRP 1.1 (*)    All other components within normal limits  PREGNANCY, URINE  SEDIMENTATION RATE    EKG  EKG Interpretation None       Radiology Dg Elbow Complete Left  Result Date: 11/03/2017 CLINICAL DATA:  Three weeks  of left posterior and lateral elbow pain with no known injury. EXAM: LEFT ELBOW - COMPLETE 3+ VIEW COMPARISON:  None in PACs FINDINGS: The bones are subjectively adequately mineralized. There is no acute or healing fracture. There is no significant osteophyte formation. A posterior fat pad sign is observed however. No soft tissue foreign bodies or gas collections are observed. IMPRESSION: Small posterior effusion. No acute bony abnormality. If there are clinical findings that might indicate septic arthritis, arthrocentesis would be a useful next imaging step. Electronically Signed   By: David  Martinique M.D.   On: 11/03/2017 11:05    Procedures Procedures (including critical care time)  Medications Ordered in ED Medications - No data to display   Initial Impression / Assessment and Plan / ED Course  I have  reviewed the triage vital signs and the nursing notes.  Pertinent imaging results that were available during my care of the patient were reviewed by me and considered in my medical decision making (see chart for details).     Pain and tenderness predominantly over lateral epicondyle, radiating down forearm but no swelling/ redness. Mild bruising in anticubital fossa which she states is from applying ACE wrap to try to help pain. No risk factors for DVT, no warmth or redness to suggest infection. XR shows small posterior effusion. Obtained labs to eval for evidence of bacterial infection. WBC and ESR normal which is reassuring against septic joint, especially given no obvious signs of infection on exam.  Suspect lateral epicondylitis based on sx. Discussed supportive measures and close sports med f/u. Return precautions reviewed including signs of infection. She voiced understanding.  Final Clinical Impressions(s) / ED Diagnoses   Final diagnoses:  Left lateral epicondylitis    ED Discharge Orders    None       Little, Wenda Overland, MD 11/03/17 [phone]

## 2017-11-03 NOTE — Discharge Instructions (Signed)
YOU CAN BUY A "TENNIS ELBOW" BRACE OR BAND THAT GOES ON YOUR ARM JUST BELOW YOUR ELBOW. YOU CAN FIND THESE AT A PHARMACY/DRUG STORE. RETURN TO ER IF YOU HAVE ANY REDNESS, SEVERE PAIN, OR FEVERS.

## 2017-11-04 ENCOUNTER — Encounter: Payer: Self-pay | Admitting: Family Medicine

## 2017-11-04 ENCOUNTER — Ambulatory Visit (INDEPENDENT_AMBULATORY_CARE_PROVIDER_SITE_OTHER): Payer: Self-pay | Admitting: Family Medicine

## 2017-11-04 DIAGNOSIS — M25522 Pain in left elbow: Secondary | ICD-10-CM | POA: Insufficient documentation

## 2017-11-04 MED ORDER — PREDNISONE 10 MG PO TABS: ORAL_TABLET | ORAL | 0 refills | Status: DC

## 2017-11-04 NOTE — Progress Notes (Signed)
PCP: [name], PA-C  Subjective:   HPI: Patient is a 42 y.o. female here for left elbow pain.  Patient reports she's had about 2-3 weeks of worsening lateral left elbow pain. No acute injury or trauma. No increase in activity preceding this. Pain worse with pronation and supination, sharp. Tried heat pad, massage, ibuprofen without much benefit. No skin changes, numbness. No radiation.  Past Medical History:  Diagnosis Date  . Asthma   . Gestational diabetes 01/25/2012  . Pregnancy induced hypertension   . Thyroid disease   . Type 2 diabetes mellitus affecting pregnancy in first trimester, antepartum 07/11/2017    Current Outpatient Medications on File Prior to Visit  Medication Sig Dispense Refill  . amLODipine (NORVASC) 10 MG tablet amlodipine 10 mg tablet  Take 1 tablet every day by oral route.    . ferrous sulfate 325 (65 FE) MG tablet Take 1 tablet (325 mg total) by mouth daily. 30 tablet 0  . IBU 600 MG tablet TAKE 1 TABLET BY MOUTH EVERY 6 HOURS AS NEEDED 385 tablet 1  . levothyroxine (SYNTHROID, LEVOTHROID) 125 MCG tablet Take 125 mcg by mouth daily before breakfast.    . lisinopril (PRINIVIL,ZESTRIL) 20 MG tablet lisinopril 20 mg tablet  TAKE 1 TABLET BY MOUTH DAILY    . metFORMIN (GLUCOPHAGE) 850 MG tablet Take 850 mg by mouth 2 (two) times daily with a meal.     No current facility-administered medications on file prior to visit.     Past Surgical History:  Procedure Laterality Date  . APPENDECTOMY  1980's  . CESAREAN SECTION  06/06/2010  . CESAREAN SECTION  03/27/2012   Procedure: CESAREAN SECTION;  Surgeon: Kathreen Cosier, MD;  Location: [facility];  Service: Gynecology;  Laterality: N/A;  Repeat Cesarean Section Delivery   Boy  @ 0005 , Apgars 8/9  . CHOLECYSTECTOMY  04/22/2012   Procedure: LAPAROSCOPIC CHOLECYSTECTOMY WITH INTRAOPERATIVE CHOLANGIOGRAM;  Surgeon: Mariella Saa, MD;  Location: [facility];  Service: General;  Laterality: N/A;  . DILATION  AND EVACUATION N/A 07/24/2017   Procedure: DILATATION AND EVACUATION;  Surgeon: Tilda Burrow, MD;  Location: [facility];  Service: Gynecology;  Laterality: N/A;  . THYROIDECTOMY    . UMBILICAL HERNIA REPAIR  09/07/2012   Procedure: HERNIA REPAIR UMBILICAL ADULT;  Surgeon: Adolph Pollack, MD;  Location: [facility];  Service: General;  Laterality: N/A;  Repair of Incisional Incarcarated hernia    Allergies  Allergen Reactions  . Aspirin Itching and Rash    Social History   Socioeconomic History  . Marital status: Married    Spouse name: Not on file  . Number of children: Not on file  . Years of education: Not on file  . Highest education level: Not on file  Social Needs  . Financial resource strain: Not on file  . Food insecurity - worry: Not on file  . Food insecurity - inability: Not on file  . Transportation needs - medical: Not on file  . Transportation needs - non-medical: Not on file  Occupational History  . Not on file  Tobacco Use  . Smoking status: Never Smoker  . Smokeless tobacco: Never Used  Substance and Sexual Activity  . Alcohol use: No  . Drug use: No  . Sexual activity: Yes    Birth control/protection: None  Other Topics Concern  . Not on file  Social History Narrative  . Not on file    Family History  Problem Relation Age  of Onset  . Hypertension Mother   . Diabetes Father   . Asthma Father   . Hypertension Father   . Anesthesia problems Neg Hx     BP (!) 145/104   Ht 5\' 4"  (1.626 m)   Wt 234 lb (106.1 kg)   LMP 10/17/2017   BMI 40.17 kg/m   Review of Systems: See HPI above.     Objective:  Physical Exam:  Gen: NAD, comfortable in exam room  Left elbow: No deformity, swelling, bruising.Marland Kitchen. FROM with 5/5 strength including wrist extension, flexion, finger flexion and extension. Pain with full supination and pronation. TTP radial tunnel.  No other tenderness. Sensation intact to light touch distally. Negative tinels cubital and radial  tunnels (pain radial tunnel though).  Right elbow: No deformity. FROM with 5/5 strength. No tenderness to palpation. Collateral ligaments intact. NVI distally.   Assessment & Plan:  1. Left elbow pain - Independently reviewed radiographs showing posterior fat pad.  Brief MSK u/s also confirmed presence of effusion left elbow.  No abnormalities in radial tunnel or spurring.  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  No injury to precipitate this or cause concern for occult fracture or OCD.  Will start prednisone dose pack, compression, icing.  F/u in 2 weeks for reevaluation.

## 2017-11-04 NOTE — Patient Instructions (Signed)
You have synovitis of the elbow with mild radial tunnel syndrome. Take prednisone dose pack as directed for 6 days. Do not take ibuprofen or aleve with this but you can restart this the day AFTER finishing the prednisone. Compression sleeve (neoprene) or ACE wrap to help push the swelling out as well. Icing 15 minutes at a time 3-4 times a day. Follow up with me in 2 weeks for reevaluation.

## 2017-11-04 NOTE — Assessment & Plan Note (Signed)
Independently reviewed radiographs showing posterior fat pad.  Brief MSK u/s also confirmed presence of effusion left elbow.  No abnormalities in radial tunnel or spurring.  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  No injury to precipitate this or cause concern for occult fracture or OCD.  Will start prednisone dose pack, compression, icing.  F/u in 2 weeks for reevaluation.

## 2017-11-18 ENCOUNTER — Ambulatory Visit: Payer: Self-pay | Admitting: Family Medicine

## 2017-12-01 ENCOUNTER — Ambulatory Visit: Payer: Self-pay

## 2018-03-10 ENCOUNTER — Telehealth: Payer: Self-pay | Admitting: Internal Medicine

## 2018-03-10 ENCOUNTER — Encounter: Payer: Self-pay | Admitting: Internal Medicine

## 2018-03-10 NOTE — Telephone Encounter (Signed)
Referral from [facility], [place] for a a dx of microcytosis. Pt has been scheduled to see Dr. Arbutus Ped on 6/5 at 1130am. Pt aware to arrive 30 minutes early. Letter mailed to the pt.

## 2018-03-24 ENCOUNTER — Encounter: Payer: Self-pay | Admitting: Internal Medicine

## 2018-03-24 ENCOUNTER — Telehealth: Payer: Self-pay

## 2018-03-24 ENCOUNTER — Inpatient Hospital Stay: Payer: Self-pay

## 2018-03-24 ENCOUNTER — Inpatient Hospital Stay: Payer: Self-pay | Attending: Internal Medicine | Admitting: Internal Medicine

## 2018-03-24 VITALS — BP 153/100 | HR 82 | Temp 98.5°F | Resp 17 | Ht 64.0 in | Wt 238.3 lb

## 2018-03-24 DIAGNOSIS — J45909 Unspecified asthma, uncomplicated: Secondary | ICD-10-CM | POA: Insufficient documentation

## 2018-03-24 DIAGNOSIS — D539 Nutritional anemia, unspecified: Secondary | ICD-10-CM

## 2018-03-24 DIAGNOSIS — Z7982 Long term (current) use of aspirin: Secondary | ICD-10-CM | POA: Insufficient documentation

## 2018-03-24 DIAGNOSIS — Z7984 Long term (current) use of oral hypoglycemic drugs: Secondary | ICD-10-CM | POA: Insufficient documentation

## 2018-03-24 DIAGNOSIS — E119 Type 2 diabetes mellitus without complications: Secondary | ICD-10-CM | POA: Insufficient documentation

## 2018-03-24 DIAGNOSIS — D649 Anemia, unspecified: Secondary | ICD-10-CM

## 2018-03-24 DIAGNOSIS — I10 Essential (primary) hypertension: Secondary | ICD-10-CM | POA: Insufficient documentation

## 2018-03-24 DIAGNOSIS — E890 Postprocedural hypothyroidism: Secondary | ICD-10-CM | POA: Insufficient documentation

## 2018-03-24 DIAGNOSIS — Z79899 Other long term (current) drug therapy: Secondary | ICD-10-CM | POA: Insufficient documentation

## 2018-03-24 DIAGNOSIS — R5382 Chronic fatigue, unspecified: Secondary | ICD-10-CM | POA: Insufficient documentation

## 2018-03-24 DIAGNOSIS — D509 Iron deficiency anemia, unspecified: Secondary | ICD-10-CM | POA: Insufficient documentation

## 2018-03-24 LAB — CMP (CANCER CENTER ONLY)
ALT: 10 U/L (ref 0–55)
AST: 16 U/L (ref 5–34)
Albumin: 3.8 g/dL (ref 3.5–5.0)
Alkaline Phosphatase: 58 U/L (ref 40–150)
Anion gap: 10 (ref 3–11)
BUN: 6 mg/dL — AB (ref 7–26)
CHLORIDE: 103 mmol/L (ref 98–109)
CO2: 24 mmol/L (ref 22–29)
Calcium: 8.6 mg/dL (ref 8.4–10.4)
Creatinine: 0.67 mg/dL (ref 0.60–1.10)
GFR, Est AFR Am: >60 mL/min (ref >60)
Glucose, Bld: 137 mg/dL (ref 70–140)
POTASSIUM: 3.8 mmol/L (ref 3.5–5.1)
Sodium: 137 mmol/L (ref 136–145)
Total Bilirubin: 0.6 mg/dL (ref 0.2–1.2)
Total Protein: 7.3 g/dL (ref 6.4–8.3)

## 2018-03-24 LAB — CBC WITH DIFFERENTIAL (CANCER CENTER ONLY)
BASOS ABS: 0 10*3/uL (ref 0.0–0.1)
Basophils Relative: 0 %
Eosinophils Absolute: 0.1 10*3/uL (ref 0.0–0.5)
Eosinophils Relative: 2 %
HCT: 40.9 % (ref 34.8–46.6)
HEMOGLOBIN: 12.6 g/dL (ref 11.6–15.9)
LYMPHS PCT: 35 %
Lymphs Abs: 2.4 10*3/uL (ref 0.9–3.3)
MCH: 23.3 pg — ABNORMAL LOW (ref 25.1–34.0)
MCHC: 30.8 g/dL — ABNORMAL LOW (ref 31.5–36.0)
MCV: 75.7 fL — AB (ref 79.5–101.0)
Monocytes Absolute: 0.4 10*3/uL (ref 0.1–0.9)
Monocytes Relative: 5 %
NEUTROS ABS: 4 10*3/uL (ref 1.5–6.5)
NEUTROS PCT: 58 %
PLATELETS: 169 10*3/uL (ref 145–400)
RBC: 5.4 MIL/uL (ref 3.70–5.45)
RDW: 14.1 % (ref 11.2–14.5)
WBC Count: 6.9 10*3/uL (ref 3.9–10.3)

## 2018-03-24 LAB — LACTATE DEHYDROGENASE: LDH: 179 U/L (ref 125–245)

## 2018-03-24 LAB — FERRITIN: Ferritin: 25 ng/mL (ref 9–269)

## 2018-03-24 LAB — FOLATE: FOLATE: 12.1 ng/mL (ref >5.9)

## 2018-03-24 LAB — IRON AND TIBC
Iron: 100 ug/dL (ref 41–142)
Saturation Ratios: 31 % (ref 21–57)
TIBC: 321 ug/dL (ref 236–444)
UIBC: 221 ug/dL

## 2018-03-24 LAB — RETICULOCYTES
RBC.: 5.4 MIL/uL (ref 3.70–5.45)
RETIC COUNT ABSOLUTE: 124.2 10*3/uL — AB (ref 33.7–90.7)
Retic Ct Pct: 2.3 % — ABNORMAL HIGH (ref 0.7–2.1)

## 2018-03-24 LAB — VITAMIN B12: VITAMIN B 12: 149 pg/mL — AB (ref 180–914)

## 2018-03-24 NOTE — Telephone Encounter (Signed)
Printed avs and calender of upcoming appointment. Per 6/5 los 

## 2018-03-24 NOTE — Progress Notes (Signed)
[facility] [phone]   [phone]  CONSULT NOTE  REFERRING PHYSICIAN: Barry BrunnerMalia Lonergan, PA-C  REASON FOR CONSULTATION:  42 years old African female with microcytic anemia.  HPI [name] is a 42 y.o. female with past medical history significant for asthma, diabetes mellitus, hypertension, hypothyroidism, status post thyroidectomy.  The patient was seen by her primary care physician for routine evaluation and CBC showed microcytic anemia.  She has been on oral iron tablets with ferrous sulfate 325 mg daily for the last 2 years.  She mentioned that she has no bleeding issues except for her heavy menstrual.  That loss for around 7 days with occasional clots.  She denied having any rectal bleeding, she has no nausea or gum bleed.  The patient denied having any dizzy spells but has chronic fatigue.  She has no chest pain, shortness of breath, cough or hemoptysis.  She has no fever or chills.  She has no nausea, vomiting, diarrhea or constipation.  She has no headache or visual changes.  She was referred to me today for reevaluation and management of her microcytic anemia. Family history significant for father with diabetes mellitus and asthma, mother had hypertension.  There is no history of anemia or malignancy in her family. The patient is married and has 2 children age 768 and 196.  She is originally from [place].  She is a stay-at-home mom.  She has no history for smoking, alcohol or drug abuse.  HPI  Past Medical History:  Diagnosis Date  . Asthma   . Gestational diabetes 01/25/2012  . Pregnancy induced hypertension   . Thyroid disease   . Type 2 diabetes mellitus affecting pregnancy in first trimester, antepartum 07/11/2017    Past Surgical History:  Procedure Laterality Date  . APPENDECTOMY  1980's  . CESAREAN SECTION  06/06/2010  . CESAREAN SECTION  03/27/2012   Procedure: CESAREAN SECTION;  Surgeon: Kathreen CosierBernard A Marshall, MD;  Location: [facility];   Service: Gynecology;  Laterality: N/A;  Repeat Cesarean Section Delivery   Boy  @ 0005 , Apgars 8/9  . CHOLECYSTECTOMY  04/22/2012   Procedure: LAPAROSCOPIC CHOLECYSTECTOMY WITH INTRAOPERATIVE CHOLANGIOGRAM;  Surgeon: Mariella SaaBenjamin T Hoxworth, MD;  Location: [facility];  Service: General;  Laterality: N/A;  . DILATION AND EVACUATION N/A 07/24/2017   Procedure: DILATATION AND EVACUATION;  Surgeon: Tilda BurrowFerguson, John V, MD;  Location: [facility];  Service: Gynecology;  Laterality: N/A;  . THYROIDECTOMY    . UMBILICAL HERNIA REPAIR  09/07/2012   Procedure: HERNIA REPAIR UMBILICAL ADULT;  Surgeon: Adolph Pollackodd J Rosenbower, MD;  Location: [facility];  Service: General;  Laterality: N/A;  Repair of Incisional Incarcarated hernia    Family History  Problem Relation Age of Onset  . Hypertension Mother   . Diabetes Father   . Asthma Father   . Hypertension Father   . Anesthesia problems Neg Hx     Social History Social History   Tobacco Use  . Smoking status: Never Smoker  . Smokeless tobacco: Never Used  Substance Use Topics  . Alcohol use: No  . Drug use: No    Allergies  Allergen Reactions  . Aspirin Itching and Rash    Current Outpatient Medications  Medication Sig Dispense Refill  . amLODipine (NORVASC) 10 MG tablet amlodipine 10 mg tablet  Take 1 tablet every day by oral route.    . ferrous sulfate 325 (65 FE) MG tablet Take 1 tablet (325 mg total) by mouth daily. 30 tablet  0  . IBU 600 MG tablet TAKE 1 TABLET BY MOUTH EVERY 6 HOURS AS NEEDED 385 tablet 1  . levothyroxine (SYNTHROID, LEVOTHROID) 125 MCG tablet Take 125 mcg by mouth daily before breakfast.    . lisinopril (PRINIVIL,ZESTRIL) 20 MG tablet lisinopril 20 mg tablet  TAKE 1 TABLET BY MOUTH DAILY    . metFORMIN (GLUCOPHAGE) 850 MG tablet Take 850 mg by mouth 2 (two) times daily with a meal.    . predniSONE (DELTASONE) 10 MG tablet 6 tabs po day 1, 5 tabs po day 2, 4 tabs po day 3, 3 tabs po day 4, 2 tabs po day 5, 1 tab po day 6 21 tablet 0  .  glipiZIDE (GLUCOTROL) 5 MG tablet glipizide 5 mg tablet  TAKE 1 TABLET BY MOUTH EVERY DAY     No current facility-administered medications for this visit.     Review of Systems  Constitutional: positive for fatigue Eyes: negative Ears, nose, mouth, throat, and face: negative Respiratory: negative Cardiovascular: negative Gastrointestinal: negative Genitourinary:negative Integument/breast: negative Hematologic/lymphatic: negative Musculoskeletal:negative Neurological: negative Behavioral/Psych: negative Endocrine: negative Allergic/Immunologic: negative  Physical Exam  WUJ:WJXBJ, healthy, no distress, well nourished and well developed SKIN: skin color, texture, turgor are normal, no rashes or significant lesions HEAD: Normocephalic, No masses, lesions, tenderness or abnormalities EYES: normal, PERRLA, Conjunctiva are pink and non-injected EARS: External ears normal, Canals clear OROPHARYNX:no exudate, no erythema and lips, buccal mucosa, and tongue normal  NECK: supple, no adenopathy, no JVD LYMPH:  no palpable lymphadenopathy, no hepatosplenomegaly BREAST:not examined LUNGS: clear to auscultation , and palpation HEART: regular rate & rhythm, no murmurs and no gallops ABDOMEN:abdomen soft, non-tender, normal bowel sounds and no masses or organomegaly BACK: Back symmetric, no curvature., No CVA tenderness EXTREMITIES:no joint deformities, effusion, or inflammation, no edema, no skin discoloration  NEURO: alert & oriented x 3 with fluent speech, no focal motor/sensory deficits  PERFORMANCE STATUS: ECOG 0  LABORATORY DATA: Lab Results  Component Value Date   WBC 6.5 11/03/2017   HGB 11.5 (L) 11/03/2017   HCT 36.5 11/03/2017   MCV 71.3 (L) 11/03/2017   PLT 177 11/03/2017      Chemistry      Component Value Date/Time   NA 137 11/03/2017 1130   K 3.6 11/03/2017 1130   CL 102 11/03/2017 1130   CO2 28 11/03/2017 1130   BUN 8 11/03/2017 1130   CREATININE 0.65  11/03/2017 1130      Component Value Date/Time   CALCIUM 8.7 (L) 11/03/2017 1130   ALKPHOS 41 07/19/2017 1604   AST 20 07/19/2017 1604   ALT 14 07/19/2017 1604   BILITOT 0.6 07/19/2017 1604       RADIOGRAPHIC STUDIES: No results found.  ASSESSMENT: This is a very pleasant 42 years old African female with microcytic anemia likely secondary to iron deficiency but hemoglobinopathy cannot be ruled out at this point.   PLAN: I had a lengthy discussion with the patient today about her current condition and further investigation to confirm her diagnosis.  I order several studies for evaluation of her condition including repeat CBC which showed improvement of her hemoglobin and hematocrit but the patient continues to have microcytosis.  She also has iron studies that was unremarkable.  I order serum folate and vitamin B12 in addition to hemoglobin electrophoresis and serum protein electrophoresis with reflex immune fixation. I recommended for the patient to continue her current treatment with the oral iron tablets. I will see her back for follow-up  visit in 1 month for reevaluation and discussion of her lab results and further recommendation regarding her condition. The patient was advised to call immediately if she has any concerning symptoms in the interval. The patient voices understanding of current disease status and treatment options and is in agreement with the current care plan.  All questions were answered. The patient knows to call the clinic with any problems, questions or concerns. We can certainly see the patient much sooner if necessary.  Thank you so much for allowing me to participate in the care of [facility]. I will continue to follow up the patient with you and assist in her care.  I spent 40 minutes counseling the patient face to face. The total time spent in the appointment was 60 minutes.  Disclaimer: This note was dictated with voice recognition software. Similar  sounding words can inadvertently be transcribed and may not be corrected upon review.   Lajuana Matte March 24, 2018, 11:54 AM

## 2018-03-26 LAB — HEMOGLOBINOPATHY EVALUATION
HGB A2 QUANT: 2 % (ref 1.8–3.2)
HGB A: 98 % (ref 96.4–98.8)
HGB C: 0 %
Hgb F Quant: 0 % (ref 0.0–2.0)
Hgb S Quant: 0 %
Hgb Variant: 0 %

## 2018-03-28 LAB — PROTEIN ELECTROPHORESIS, SERUM, WITH REFLEX
A/G Ratio: 1.1 (ref 0.7–1.7)
ALPHA-1-GLOBULIN: 0.2 g/dL (ref 0.0–0.4)
ALPHA-2-GLOBULIN: 0.7 g/dL (ref 0.4–1.0)
Albumin ELP: 3.5 g/dL (ref 2.9–4.4)
Beta Globulin: 1.1 g/dL (ref 0.7–1.3)
Gamma Globulin: 1.2 g/dL (ref 0.4–1.8)
Globulin, Total: 3.2 g/dL (ref 2.2–3.9)
Total Protein ELP: 6.7 g/dL (ref 6.0–8.5)

## 2018-04-27 NOTE — Progress Notes (Signed)
[facility] OFFICE PROGRESS NOTE  Kylie Kitty, PA-C [address] [place] [address]  DIAGNOSIS: Microcytic anemia with a component of iron deficiency.  The patient also has vitamin B12 deficiency.  PRIOR THERAPY: None  CURRENT THERAPY: 1) ferrous sulfate 325 mg 2-3 times a day. 2) vitamin B12 1000 mcg by mouth daily.  INTERVAL HISTORY: Kylie Baker 42 y.o. female returns for routine follow-up visit by herself.  The patient is feeling fine today and has no specific complaints.  She reports that she does not have any fatigue and her dizziness has resolved.  She continues to have heavy periods that can last anywhere from 3 to 7 days with occasional clots.  She denies any other bleeding.  The patient denies fevers and chills.  Denies chest pain, shortness of breath, cough, hemoptysis.  Denies nausea, vomiting, constipation, diarrhea.  Denies bleeding, bruising, ecchymosis.  The patient did not take her blood pressure medications prior to coming this morning.  She checks her blood pressure at home.  Most recently it was 128/84.  The patient had lab work performed at her last visit and is here to discuss the results and treatment options.  MEDICAL HISTORY: Past Medical History:  Diagnosis Date  . Asthma   . Gestational diabetes 01/25/2012  . Pregnancy induced hypertension   . Thyroid disease   . Type 2 diabetes mellitus affecting pregnancy in first trimester, antepartum 07/11/2017    ALLERGIES:  is allergic to aspirin.  MEDICATIONS:  Current Outpatient Medications  Medication Sig Dispense Refill  . amLODipine (NORVASC) 10 MG tablet amlodipine 10 mg tablet  Take 1 tablet every day by oral route.    . cholecalciferol (VITAMIN D) 1000 units tablet Take 1,000 Units by mouth daily.    . ferrous sulfate 325 (65 FE) MG tablet Take 1 tablet (325 mg total) by mouth daily. 30 tablet 0  . glipiZIDE (GLUCOTROL) 5 MG tablet glipizide 5 mg tablet  TAKE 1 TABLET BY MOUTH  EVERY DAY    . IBU 600 MG tablet TAKE 1 TABLET BY MOUTH EVERY 6 HOURS AS NEEDED 385 tablet 1  . levothyroxine (SYNTHROID, LEVOTHROID) 125 MCG tablet Take 125 mcg by mouth daily before breakfast.    . levothyroxine (SYNTHROID, LEVOTHROID) 137 MCG tablet Take 137 mcg by mouth daily before breakfast.    . lisinopril (PRINIVIL,ZESTRIL) 20 MG tablet lisinopril 20 mg tablet  TAKE 1 TABLET BY MOUTH DAILY    . metFORMIN (GLUCOPHAGE) 850 MG tablet Take 850 mg by mouth 2 (two) times daily with a meal.    . Norgestimate-Ethinyl Estradiol Triphasic (TRI-SPRINTEC) 0.18/0.215/0.25 MG-35 MCG tablet Take 1 tablet by mouth daily.    . Omega-3 Fatty Acids (FISH OIL) 1000 MG CAPS Take by mouth.    . vitamin C (ASCORBIC ACID) 500 MG tablet Take 500 mg by mouth daily.     No current facility-administered medications for this visit.     SURGICAL HISTORY:  Past Surgical History:  Procedure Laterality Date  . APPENDECTOMY  1980's  . CESAREAN SECTION  06/06/2010  . CESAREAN SECTION  03/27/2012   Procedure: CESAREAN SECTION;  Surgeon: Kathreen Cosier, MD;  Location: [facility];  Service: Gynecology;  Laterality: N/A;  Repeat Cesarean Section Delivery   Boy  @ 0005 , Apgars 8/9  . CHOLECYSTECTOMY  04/22/2012   Procedure: LAPAROSCOPIC CHOLECYSTECTOMY WITH INTRAOPERATIVE CHOLANGIOGRAM;  Surgeon: Mariella Saa, MD;  Location: [facility];  Service: General;  Laterality: N/A;  .  DILATION AND EVACUATION N/A 07/24/2017   Procedure: DILATATION AND EVACUATION;  Surgeon: Tilda BurrowFerguson, John V, MD;  Location: [facility];  Service: Gynecology;  Laterality: N/A;  . THYROIDECTOMY    . UMBILICAL HERNIA REPAIR  09/07/2012   Procedure: HERNIA REPAIR UMBILICAL ADULT;  Surgeon: Adolph Pollackodd J Rosenbower, MD;  Location: [facility];  Service: General;  Laterality: N/A;  Repair of Incisional Incarcarated hernia    REVIEW OF SYSTEMS:   Review of Systems  Constitutional: Negative for appetite change, chills, fatigue, fever and unexpected weight change.  HENT:    Negative for mouth sores, nosebleeds, sore throat and trouble swallowing.   Eyes: Negative for eye problems and icterus.  Respiratory: Negative for cough, hemoptysis, shortness of breath and wheezing.   Cardiovascular: Negative for chest pain and leg swelling.  Gastrointestinal: Negative for abdominal pain, constipation, diarrhea, nausea and vomiting.  Genitourinary: Negative for bladder incontinence, difficulty urinating, dysuria, frequency and hematuria.   Musculoskeletal: Negative for back pain, gait problem, neck pain and neck stiffness.  Skin: Negative for itching and rash.  Neurological: Negative for dizziness, extremity weakness, gait problem, headaches, light-headedness and seizures.  Hematological: Negative for adenopathy. Does not bruise/bleed easily.  Psychiatric/Behavioral: Negative for confusion, depression and sleep disturbance. The patient is not nervous/anxious.     PHYSICAL EXAMINATION:  Blood pressure (!) 150/104, pulse 91, temperature 99.2 F (37.3 C), temperature source Oral, resp. rate 18, height 5\' 4"  (1.626 m), weight 238 lb 11.2 oz (108.3 kg), last menstrual period 05/07/2017, SpO2 99 %, unknown if currently breastfeeding.  ECOG PERFORMANCE STATUS: 1 - Symptomatic but completely ambulatory  Physical Exam  Constitutional: Oriented to person, place, and time and well-developed, well-nourished, and in no distress. No distress.  HENT:  Head: Normocephalic and atraumatic.  Mouth/Throat: Oropharynx is clear and moist. No oropharyngeal exudate.  Eyes: Conjunctivae are normal. Right eye exhibits no discharge. Left eye exhibits no discharge. No scleral icterus.  Neck: Normal range of motion. Neck supple.  Cardiovascular: Normal rate, regular rhythm, normal heart sounds and intact distal pulses.   Pulmonary/Chest: Effort normal and breath sounds normal. No respiratory distress. No wheezes. No rales.  Abdominal: Soft. Bowel sounds are normal. Exhibits no distension and no  mass. There is no tenderness.  Musculoskeletal: Normal range of motion. Exhibits no edema.  Lymphadenopathy:    No cervical adenopathy.  Neurological: Alert and oriented to person, place, and time. Exhibits normal muscle tone. Gait normal. Coordination normal.  Skin: Skin is warm and dry. No rash noted. Not diaphoretic. No erythema. No pallor.  Psychiatric: Mood, memory and judgment normal.  Vitals reviewed.  LABORATORY DATA: Lab Results  Component Value Date   WBC 6.3 04/28/2018   HGB 11.9 04/28/2018   HCT 38.8 04/28/2018   MCV 75.0 (L) 04/28/2018   PLT 175 04/28/2018      Chemistry      Component Value Date/Time   NA 137 03/24/2018 1236   K 3.8 03/24/2018 1236   CL 103 03/24/2018 1236   CO2 24 03/24/2018 1236   BUN 6 (L) 03/24/2018 1236   CREATININE 0.67 03/24/2018 1236      Component Value Date/Time   CALCIUM 8.6 03/24/2018 1236   ALKPHOS 58 03/24/2018 1236   AST 16 03/24/2018 1236   ALT 10 03/24/2018 1236   BILITOT 0.6 03/24/2018 1236     Labs from 03/24/2018: Folate 12.1, vitamin B12 149, hemoglobinopathy evaluation showed a normal adult hemoglobin, LDH 179, absolute reticulocyte count 124.2, ferritin 25, iron 100, TIBC 321,  percent saturation 31%, UIBC 221, serum protein electrophoresis showed no M spike  RADIOGRAPHIC STUDIES:  No results found.   ASSESSMENT/PLAN:  Anemia This is a very pleasant 42 year old African female with microcytic anemia likely due to iron deficiency.  The patient was also found to have a vitamin B12 deficiency lab work.  The patient was seen with Dr. Shirline Frees.  Recent labs were discussed with the patient.  We advised her to increase her ferrous sulfate to at least 2-3 times a day.  She was advised to take this with vitamin C or orange juice to increase the absorption.  She was advised to avoid taking her iron with tea since will decrease absorption.  We also discussed that she has evidence of vitamin B12 deficiency on her lab work.  The  patient was given vitamin B12 1000 mcg IM here in our office today.  She was advised to begin vitamin B12 1000 mcg by mouth daily. The patient will return in 6 months for evaluation and repeat lab work.  The patient was advised to call immediately if she has any concerning symptoms in the interval. The patient voices understanding of current disease status and treatment options and is in agreement with the current care plan.  All questions were answered. The patient knows to call the clinic with any problems, questions or concerns. We can certainly see the patient much sooner if necessary.   Orders Placed This Encounter  Procedures  . CBC with Differential (Cancer Center Only)    Standing Status:   Future    Standing Expiration Date:   04/29/2019  . Ferritin    Standing Status:   Future    Standing Expiration Date:   04/29/2019  . Iron and TIBC    Standing Status:   Future    Standing Expiration Date:   04/29/2019  . Vitamin B12    Standing Status:   Future    Standing Expiration Date:   04/28/2019   Clenton Pare, DNP, AGPCNP-BC, AOCNP 04/28/18  ADDENDUM: Hematology/Oncology Attending: I had a face-to-face encounter with the patient today.  I recommended her care plan.  This is a very pleasant 42 years old African female with persistent anemia.  The patient underwent several studies for evaluation of her condition include repeat CBC, iron study, ferritin, serum folate, vitamin B12 level as well as serum protein electrophoresis.  Her lab work was unremarkable except for the low vitamin B12 level and low normal ferritin level. I recommended for the patient to continue on the oral iron tablets for now and she will increase her dose to 2-3 tablets every day with vitamin C. For the vitamin B12 deficiency, will give the patient a dose of vitamin B12 intramuscular today.  We will also encourage the patient to take over-the-counter vitamin B12 tablets. We will see her back for follow-up visit  in 6 months for evaluation and repeat blood work. She was advised to call immediately if she has any concerning symptoms in the interval.  Disclaimer: This note was dictated with voice recognition software. Similar sounding words can inadvertently be transcribed and may be missed upon review. Lajuana Matte, MD 04/28/18

## 2018-04-28 ENCOUNTER — Encounter: Payer: Self-pay | Admitting: Oncology

## 2018-04-28 ENCOUNTER — Inpatient Hospital Stay (HOSPITAL_BASED_OUTPATIENT_CLINIC_OR_DEPARTMENT_OTHER): Payer: Self-pay | Admitting: Oncology

## 2018-04-28 ENCOUNTER — Inpatient Hospital Stay: Payer: Self-pay | Attending: Oncology

## 2018-04-28 ENCOUNTER — Telehealth: Payer: Self-pay | Admitting: Oncology

## 2018-04-28 VITALS — BP 150/104 | HR 91 | Temp 99.2°F | Resp 18 | Ht 64.0 in | Wt 238.7 lb

## 2018-04-28 DIAGNOSIS — E119 Type 2 diabetes mellitus without complications: Secondary | ICD-10-CM | POA: Insufficient documentation

## 2018-04-28 DIAGNOSIS — E079 Disorder of thyroid, unspecified: Secondary | ICD-10-CM

## 2018-04-28 DIAGNOSIS — Z79899 Other long term (current) drug therapy: Secondary | ICD-10-CM

## 2018-04-28 DIAGNOSIS — D649 Anemia, unspecified: Secondary | ICD-10-CM

## 2018-04-28 DIAGNOSIS — Z7984 Long term (current) use of oral hypoglycemic drugs: Secondary | ICD-10-CM | POA: Insufficient documentation

## 2018-04-28 DIAGNOSIS — E538 Deficiency of other specified B group vitamins: Secondary | ICD-10-CM | POA: Insufficient documentation

## 2018-04-28 DIAGNOSIS — N920 Excessive and frequent menstruation with regular cycle: Secondary | ICD-10-CM | POA: Insufficient documentation

## 2018-04-28 DIAGNOSIS — D509 Iron deficiency anemia, unspecified: Secondary | ICD-10-CM | POA: Insufficient documentation

## 2018-04-28 DIAGNOSIS — D539 Nutritional anemia, unspecified: Secondary | ICD-10-CM

## 2018-04-28 LAB — CBC WITH DIFFERENTIAL (CANCER CENTER ONLY)
BASOS ABS: 0 10*3/uL (ref 0.0–0.1)
Basophils Relative: 0 %
EOS PCT: 2 %
Eosinophils Absolute: 0.1 10*3/uL (ref 0.0–0.5)
HEMATOCRIT: 38.8 % (ref 34.8–46.6)
Hemoglobin: 11.9 g/dL (ref 11.6–15.9)
LYMPHS PCT: 36 %
Lymphs Abs: 2.3 10*3/uL (ref 0.9–3.3)
MCH: 23 pg — ABNORMAL LOW (ref 25.1–34.0)
MCHC: 30.7 g/dL — ABNORMAL LOW (ref 31.5–36.0)
MCV: 75 fL — AB (ref 79.5–101.0)
Monocytes Absolute: 0.4 10*3/uL (ref 0.1–0.9)
Monocytes Relative: 6 %
NEUTROS ABS: 3.5 10*3/uL (ref 1.5–6.5)
Neutrophils Relative %: 56 %
PLATELETS: 175 10*3/uL (ref 145–400)
RBC: 5.17 MIL/uL (ref 3.70–5.45)
RDW: 14.1 % (ref 11.2–14.5)
WBC: 6.3 10*3/uL (ref 3.9–10.3)

## 2018-04-28 MED ORDER — CYANOCOBALAMIN 1000 MCG/ML IJ SOLN
INTRAMUSCULAR | Status: AC
  Filled 2018-04-28: qty 1

## 2018-04-28 MED ORDER — CYANOCOBALAMIN 1000 MCG/ML IJ SOLN
1000.0000 ug | Freq: Once | INTRAMUSCULAR | Status: AC
  Administered 2018-04-28: 1000 ug via INTRAMUSCULAR

## 2018-04-28 NOTE — Patient Instructions (Addendum)
Begin Vitamin B12 1000 mcg daily. Increase ferrous sulfate to 2 times a day. Take with Vitamin C or orange juice. Do not take iron with tea.

## 2018-04-28 NOTE — Assessment & Plan Note (Signed)
This is a very pleasant 42 year old African female with microcytic anemia likely due to iron deficiency.  The patient was also found to have a vitamin B12 deficiency lab work.  The patient was seen with Dr. Shirline FreesMohammed.  Recent labs were discussed with the patient.  We advised her to increase her ferrous sulfate to at least 2-3 times a day.  She was advised to take this with vitamin C or orange juice to increase the absorption.  She was advised to avoid taking her iron with tea since will decrease absorption.  We also discussed that she has evidence of vitamin B12 deficiency on her lab work.  The patient was given vitamin B12 1000 mcg IM here in our office today.  She was advised to begin vitamin B12 1000 mcg by mouth daily. The patient will return in 6 months for evaluation and repeat lab work.  The patient was advised to call immediately if she has any concerning symptoms in the interval. The patient voices understanding of current disease status and treatment options and is in agreement with the current care plan.  All questions were answered. The patient knows to call the clinic with any problems, questions or concerns. We can certainly see the patient much sooner if necessary.

## 2018-04-28 NOTE — Telephone Encounter (Signed)
Scheduled appt per 7/10 los - sent reminder letter in the mail with appt date and time.  

## 2018-10-28 ENCOUNTER — Inpatient Hospital Stay: Payer: Self-pay

## 2018-10-28 ENCOUNTER — Inpatient Hospital Stay: Payer: Self-pay | Attending: Internal Medicine | Admitting: Internal Medicine

## 2018-12-12 ENCOUNTER — Other Ambulatory Visit: Payer: Self-pay

## 2018-12-12 ENCOUNTER — Encounter (HOSPITAL_COMMUNITY): Payer: Self-pay | Admitting: Emergency Medicine

## 2018-12-12 ENCOUNTER — Emergency Department (HOSPITAL_COMMUNITY): Payer: Self-pay

## 2018-12-12 ENCOUNTER — Emergency Department (HOSPITAL_COMMUNITY)
Admission: EM | Admit: 2018-12-12 | Discharge: 2018-12-12 | Disposition: A | Discharge status: Home / Self Care | Payer: Self-pay | Attending: Emergency Medicine | Admitting: Emergency Medicine

## 2018-12-12 DIAGNOSIS — Z8632 Personal history of gestational diabetes: Secondary | ICD-10-CM | POA: Insufficient documentation

## 2018-12-12 DIAGNOSIS — R42 Dizziness and giddiness: Secondary | ICD-10-CM | POA: Insufficient documentation

## 2018-12-12 DIAGNOSIS — E079 Disorder of thyroid, unspecified: Secondary | ICD-10-CM | POA: Insufficient documentation

## 2018-12-12 DIAGNOSIS — Z79899 Other long term (current) drug therapy: Secondary | ICD-10-CM | POA: Insufficient documentation

## 2018-12-12 DIAGNOSIS — R0789 Other chest pain: Secondary | ICD-10-CM | POA: Insufficient documentation

## 2018-12-12 DIAGNOSIS — I471 Supraventricular tachycardia: Secondary | ICD-10-CM | POA: Insufficient documentation

## 2018-12-12 DIAGNOSIS — Z7984 Long term (current) use of oral hypoglycemic drugs: Secondary | ICD-10-CM | POA: Insufficient documentation

## 2018-12-12 DIAGNOSIS — E119 Type 2 diabetes mellitus without complications: Secondary | ICD-10-CM | POA: Insufficient documentation

## 2018-12-12 LAB — BASIC METABOLIC PANEL
Anion gap: 9 (ref 5–15)
BUN: 10 mg/dL (ref 6–20)
CALCIUM: 8.4 mg/dL — AB (ref 8.9–10.3)
CO2: 25 mmol/L (ref 22–32)
CREATININE: 0.71 mg/dL (ref 0.44–1.00)
Chloride: 103 mmol/L (ref 98–111)
GFR calc non Af Amer: >60 mL/min (ref >60)
Glucose, Bld: 326 mg/dL — ABNORMAL HIGH (ref 70–99)
Potassium: 3.4 mmol/L — ABNORMAL LOW (ref 3.5–5.1)
SODIUM: 137 mmol/L (ref 135–145)

## 2018-12-12 LAB — MAGNESIUM: MAGNESIUM: 1.7 mg/dL (ref 1.7–2.4)

## 2018-12-12 LAB — CBC
HCT: 48.1 % — ABNORMAL HIGH (ref 36.0–46.0)
Hemoglobin: 13.6 g/dL (ref 12.0–15.0)
MCH: 21.6 pg — AB (ref 26.0–34.0)
MCHC: 28.3 g/dL — ABNORMAL LOW (ref 30.0–36.0)
MCV: 76.5 fL — ABNORMAL LOW (ref 80.0–100.0)
NRBC: 0 % (ref 0.0–0.2)
PLATELETS: 179 10*3/uL (ref 150–400)
RBC: 6.29 MIL/uL — ABNORMAL HIGH (ref 3.87–5.11)
RDW: 14.1 % (ref 11.5–15.5)
WBC: 6 10*3/uL (ref 4.0–10.5)

## 2018-12-12 LAB — T4, FREE: Free T4: 1.09 ng/dL (ref 0.82–1.77)

## 2018-12-12 LAB — TSH: TSH: 4.17 u[IU]/mL (ref 0.350–4.500)

## 2018-12-12 LAB — I-STAT BETA HCG BLOOD, ED (MC, WL, AP ONLY): I-stat hCG, quantitative: <5 m[IU]/mL (ref <5)

## 2018-12-12 MED ORDER — METOPROLOL SUCCINATE ER 25 MG PO TB24: 25.0000 mg | ORAL_TABLET | Freq: Every day | ORAL | 0 refills | Status: DC

## 2018-12-12 MED ORDER — METOPROLOL TARTRATE 25 MG PO TABS
12.5000 mg | ORAL_TABLET | Freq: Once | ORAL | Status: AC
  Administered 2018-12-12: 25 mg via ORAL
  Filled 2018-12-12: qty 1

## 2018-12-12 NOTE — ED Notes (Signed)
Patient verbalizes understanding of discharge instructions. Opportunity for questioning and answers were provided. Armband removed by staff, pt discharged from ED.  

## 2018-12-12 NOTE — ED Triage Notes (Signed)
Per [facility]- pt picked up from home, states she was braiding hair when she started having chest pain. Pt went into [facility], given 6mg  of adenosine by EMS, converted back to normal sinus.  Pt states that happened two years ago. Denies chest upon arrival to the ED. Alert and oriented x3.

## 2018-12-12 NOTE — ED Provider Notes (Signed)
[facility] EMERGENCY DEPARTMENT Provider Note   CSN: [id number] Arrival date & time: 12/12/18  1415    History   Chief Complaint Chief Complaint  Patient presents with  . Chest Pain  . SVT    HPI Kylie Baker is a 42 y.o. female.     HPI   43 yo F with h/o hypothyroidism s/p thyroidectomy, T2DM, anemia, here w/ palpitations. Pt states she was in her usual state of health until just prior to arrival. She was braiding hair when she experienced acute onset of regular, fast palpitations. She felt sweaty, lightheaded, and developed mild chest pressure. EMS was called and on their arrival, she was noted to be in SVT. No improvement w/ vagal maneuvers so she was given adenosine 6 mg, which resolved her SVT. She now feels "great." Has a h/o SVT once before and "fast heart rate," but is not on any rate control agents. No fever, chills, cough, SOB. No recent med changes or thyroid med changes. No leg swelling. No caffeine or drug use.  Past Medical History:  Diagnosis Date  . Asthma   . Gestational diabetes 01/25/2012  . Pregnancy induced hypertension   . Thyroid disease   . Type 2 diabetes mellitus affecting pregnancy in first trimester, antepartum 07/11/2017    Patient Active Problem List   Diagnosis Date Noted  . Vitamin B 12 deficiency 04/28/2018  . Iron deficiency anemia 04/28/2018  . Anemia 04/28/2018  . Left elbow pain 11/04/2017  . Syncope 07/20/2017  . Symptomatic anemia 07/20/2017  . Incomplete abortion with delayed or excessive hemorrhage 07/20/2017  . Type 2 diabetes mellitus affecting pregnancy in first trimester, antepartum 07/11/2017  . Goiter, nodular 07/04/2015  . Vitamin D deficiency 03/27/2015  . Unspecified essential hypertension 04/13/2014  . Hypercalcemia 02/09/2014  . Subclinical hyperthyroidism 02/09/2014  . Incarcerated incisional hernia-s/p repair 09/07/12 10/05/2012  . Supervision of high-risk pregnancy 01/25/2012  . Gestational  diabetes 01/25/2012  . Chronic hypertension complicating or reason for care during pregnancy 01/25/2012  . Asthma 01/25/2012    Past Surgical History:  Procedure Laterality Date  . APPENDECTOMY  1980's  . CESAREAN SECTION  06/06/2010  . CESAREAN SECTION  03/27/2012   Procedure: CESAREAN SECTION;  Surgeon: Kathreen Cosier, MD;  Location: [facility];  Service: Gynecology;  Laterality: N/A;  Repeat Cesarean Section Delivery   Boy  @ 0005 , Apgars 8/9  . CHOLECYSTECTOMY  04/22/2012   Procedure: LAPAROSCOPIC CHOLECYSTECTOMY WITH INTRAOPERATIVE CHOLANGIOGRAM;  Surgeon: Mariella Saa, MD;  Location: [facility];  Service: General;  Laterality: N/A;  . DILATION AND EVACUATION N/A 07/24/2017   Procedure: DILATATION AND EVACUATION;  Surgeon: Tilda Burrow, MD;  Location: [facility];  Service: Gynecology;  Laterality: N/A;  . THYROIDECTOMY    . UMBILICAL HERNIA REPAIR  09/07/2012   Procedure: HERNIA REPAIR UMBILICAL ADULT;  Surgeon: Adolph Pollack, MD;  Location: [facility];  Service: General;  Laterality: N/A;  Repair of Incisional Incarcarated hernia     OB History    Gravida  4   Para  2   Term  1   Preterm  1   AB  1   Living  2     SAB  1   TAB  0   Ectopic  0   Multiple  0   Live Births  2            Home Medications    Prior to Admission medications   Medication  Sig Start Date End Date Taking? Authorizing Provider  amLODipine (NORVASC) 10 MG tablet amlodipine 10 mg tablet  Take 1 tablet every day by oral route.    [provider]  cholecalciferol (VITAMIN D) 1000 units tablet Take 1,000 Units by mouth daily.    [provider]  ferrous sulfate 325 (65 FE) MG tablet Take 1 tablet (325 mg total) by mouth daily. 09/26/16   Mabe, Latanya Maudlin, MD  glipiZIDE (GLUCOTROL) 5 MG tablet glipizide 5 mg tablet  TAKE 1 TABLET BY MOUTH EVERY DAY    [provider]  IBU 600 MG tablet TAKE 1 TABLET BY MOUTH EVERY 6 HOURS AS NEEDED 09/15/17   Tilda Burrow, MD    levothyroxine (SYNTHROID, LEVOTHROID) 125 MCG tablet Take 125 mcg by mouth daily before breakfast.    [provider]  levothyroxine (SYNTHROID, LEVOTHROID) 137 MCG tablet Take 137 mcg by mouth daily before breakfast.    [provider]  levothyroxine (SYNTHROID, LEVOTHROID) 200 MCG tablet levothyroxine 200 mcg tablet  Take 1 tablet every day by oral route for 30 days.    [provider]  lisinopril (PRINIVIL,ZESTRIL) 20 MG tablet lisinopril 20 mg tablet  TAKE 1 TABLET BY MOUTH DAILY    [provider]  metFORMIN (GLUCOPHAGE) 850 MG tablet Take 850 mg by mouth 2 (two) times daily with a meal.    [provider]  metoprolol succinate (TOPROL-XL) 25 MG 24 hr tablet Take 1 tablet (25 mg total) by mouth daily for 30 days. 12/12/18 01/11/19  Shaune Pollack, MD  Norgestimate-Ethinyl Estradiol Triphasic (TRI-SPRINTEC) 0.18/0.215/0.25 MG-35 MCG tablet Take 1 tablet by mouth daily.    [provider]  Omega-3 Fatty Acids (FISH OIL) 1000 MG CAPS Take by mouth.    [provider]  vitamin C (ASCORBIC ACID) 500 MG tablet Take 500 mg by mouth daily.    [provider]    Family History Family History  Problem Relation Age of Onset  . Hypertension Mother   . Diabetes Father   . Asthma Father   . Hypertension Father   . Anesthesia problems Neg Hx     Social History Social History   Tobacco Use  . Smoking status: Never Smoker  . Smokeless tobacco: Never Used  Substance Use Topics  . Alcohol use: No  . Drug use: No     Allergies   Aspirin   Review of Systems Review of Systems  Constitutional: Positive for fatigue. Negative for chills and fever.  HENT: Negative for congestion and rhinorrhea.   Eyes: Negative for visual disturbance.  Respiratory: Positive for chest tightness and shortness of breath. Negative for cough and wheezing.   Cardiovascular: Positive for palpitations. Negative for chest pain and leg swelling.   Gastrointestinal: Negative for abdominal pain, diarrhea, nausea and vomiting.  Genitourinary: Negative for dysuria and flank pain.  Musculoskeletal: Negative for neck pain and neck stiffness.  Skin: Negative for rash and wound.  Allergic/Immunologic: Negative for immunocompromised state.  Neurological: Negative for syncope, weakness and headaches.  All other systems reviewed and are negative.    Physical Exam Updated Vital Signs BP 139/79   Pulse 91   Temp (!) 97.3 F (36.3 C) (Oral)   Resp (!) 21   Ht 5\' 3"  (1.6 m)   Wt 90.7 kg   SpO2 99%   BMI 35.43 kg/m   Physical Exam Vitals signs and nursing note reviewed.  Constitutional:      General: She is not  in acute distress.    Appearance: She is well-developed.  HENT:     Head: Normocephalic and atraumatic.  Eyes:     Conjunctiva/sclera: Conjunctivae normal.  Neck:     Musculoskeletal: Neck supple.  Cardiovascular:     Rate and Rhythm: Normal rate and regular rhythm.     Heart sounds: Normal heart sounds.  Pulmonary:     Effort: Pulmonary effort is normal. No tachypnea or respiratory distress.     Breath sounds: No wheezing.  Abdominal:     General: There is no distension.  Skin:    General: Skin is warm.     Capillary Refill: Capillary refill takes less than 2 seconds.     Findings: No rash.  Neurological:     Mental Status: She is alert and oriented to person, place, and time.     Motor: No abnormal muscle tone.      ED Treatments / Results  Labs (all labs ordered are listed, but only abnormal results are displayed) Labs Reviewed  BASIC METABOLIC PANEL - Abnormal; Notable for the following components:      Result Value   Potassium 3.4 (*)    Glucose, Bld 326 (*)    Calcium 8.4 (*)    All other components within normal limits  CBC - Abnormal; Notable for the following components:   RBC 6.29 (*)    HCT 48.1 (*)    MCV 76.5 (*)    MCH 21.6 (*)    MCHC 28.3 (*)    All other components within normal  limits  TSH  T4, FREE  MAGNESIUM  I-STAT BETA HCG BLOOD, ED ([facility], [facility], [facility] ONLY)    EKG EKG Interpretation  Date/Time:  Sunday December 12 2018 14:21:12 EST Ventricular Rate:  95 PR Interval:    QRS Duration: 86 QT Interval:  397 QTC Calculation: 500 R Axis:   21 Text Interpretation:  Sinus rhythm Borderline T wave abnormalities Borderline prolonged QT interval Confirmed by Cathren Laine ([id number]) on 12/12/2018 2:53:00 PM   Radiology Dg Chest 2 View  Result Date: 12/12/2018 CLINICAL DATA:  Chest pain EXAM: CHEST - 2 VIEW COMPARISON:  07/20/2017 FINDINGS: Heart and mediastinal contours are within normal limits. No focal opacities or effusions. No acute bony abnormality. IMPRESSION: No active cardiopulmonary disease. Electronically Signed   By: Charlett Nose M.D.   On: 12/12/2018 16:10    Procedures Procedures (including critical care time)  Medications Ordered in ED Medications  metoprolol tartrate (LOPRESSOR) tablet 12.5 mg (25 mg Oral Given 12/12/18 1640)     Initial Impression / Assessment and Plan / ED Course  I have reviewed the triage vital signs and the nursing notes.  Pertinent labs & imaging results that were available during my care of the patient were reviewed by me and considered in my medical decision making (see chart for details).  Clinical Course as of Dec 12 1710  Sun Dec 12, 2018  1516 44 yo F with h/o hypothyroidism, prior SVT x 1 here w/ SVT, now resolved after Adenosine. EKG shows sinus tach, no signs of delta wave, WPW, long QT or other arrhythmogenic abnormality. Will check basic labs. Pt reports h/o "always high HR" and has now had symptomatic SVT twice. H/o asthma but may benefit from diltiazem or other beta blockade.   [CI]  1541 Labs show mild hypoK - replaced. No significant anemia. Will d/w Cards.   [CI]  1551 Discussed with Dr. Ladona Ridgel of Cardiology appreciate recommendations. Will start on  metop 25 daily, refer for outpt f/u with him.   [CI]  1707  Remains asymptomatic here in NSR. Metop given. Will d/c with outpt Cards f/u.   [CI]    Clinical Course User Index [CI] Shaune Pollack, MD       Final Clinical Impressions(s) / ED Diagnoses   Final diagnoses:  SVT (supraventricular tachycardia) [facility])    ED Discharge Orders         Ordered    metoprolol succinate (TOPROL-XL) 25 MG 24 hr tablet  Daily     12/12/18 1709           Shaune Pollack, MD 12/12/18 1712

## 2019-05-09 ENCOUNTER — Telehealth: Payer: Self-pay

## 2019-05-09 NOTE — Telephone Encounter (Signed)
Spoke with pt regarding appt on 05/16/19. Pt was advise to check vitals prior to appt. Pt questions and concerns were address. 

## 2019-05-16 ENCOUNTER — Telehealth (INDEPENDENT_AMBULATORY_CARE_PROVIDER_SITE_OTHER): Payer: Self-pay | Admitting: Internal Medicine

## 2019-05-16 ENCOUNTER — Encounter: Payer: Self-pay | Admitting: Internal Medicine

## 2019-05-16 VITALS — Ht 63.0 in | Wt 246.0 lb

## 2019-05-16 DIAGNOSIS — I471 Supraventricular tachycardia: Secondary | ICD-10-CM

## 2019-05-16 DIAGNOSIS — I10 Essential (primary) hypertension: Secondary | ICD-10-CM

## 2019-05-16 NOTE — Progress Notes (Signed)
Electrophysiology TeleHealth Note   Due to national recommendations of social distancing due to COVID 19, Audio/video telehealth visit is felt to be most appropriate for this patient at this time.  See MyChart message from today for patient consent regarding telehealth for [facility].   Date:  05/16/2019   ID:  Kylie Baker, DOB [birth year], MRN [id number]  Location: home Provider location: [address], [place] Evaluation Performed: New patient consult  PCP:  Sandre KittyLonergan, Malia A, PA-C   Electrophysiologist:  None   Chief Complaint:  SVT  History of Present Illness:    Kylie PilesDoris Teems is a 43 y.o. female who presents via audio/video conferencing for a telehealth visit today.   The patient is referred for new consultation regarding SVT by Dr Denton LankSteinl.  She reports abrupt onset/ offset of tachypalpitations.  Her first episode occurred 2017.  Since that time, she reports having 2-3 episodes.  Most recently she had an episode in February 2020.  With this episode, ekgs are not available however per report she did receive adenosine which terminated her arrhythmia.  She has had no further prolonged events.  She has tried vagal maneuvers without termination of events.  Deep breathing will occasionally help.  She is unaware of triggers/ precipitants for her arrhythmias.   Today, she denies symptoms of palpitations, chest pain, shortness of breath, orthopnea, PND, lower extremity edema, claudication, dizziness, presyncope, syncope, bleeding, or neurologic sequela. The patient is tolerating medications without difficulties and is otherwise without complaint today.   she denies symptoms of cough, fevers, chills, or new SOB worrisome for COVID 19.   Past Medical History:  Diagnosis Date  . Asthma   . Diabetes ([facility]) 07/11/2017  . Gestational diabetes 01/25/2012  . Hypertension   . Palpitations   . Pregnancy induced hypertension   . Thyroid disease     Past Surgical History:   Procedure Laterality Date  . APPENDECTOMY  1980's  . CESAREAN SECTION  06/06/2010  . CESAREAN SECTION  03/27/2012   Procedure: CESAREAN SECTION;  Surgeon: Kathreen CosierBernard A Marshall, MD;  Location: [facility];  Service: Gynecology;  Laterality: N/A;  Repeat Cesarean Section Delivery   Boy  @ 0005 , Apgars 8/9  . CHOLECYSTECTOMY  04/22/2012   Procedure: LAPAROSCOPIC CHOLECYSTECTOMY WITH INTRAOPERATIVE CHOLANGIOGRAM;  Surgeon: Mariella SaaBenjamin T Hoxworth, MD;  Location: [facility];  Service: General;  Laterality: N/A;  . DILATION AND EVACUATION N/A 07/24/2017   Procedure: DILATATION AND EVACUATION;  Surgeon: Tilda BurrowFerguson, John V, MD;  Location: [facility];  Service: Gynecology;  Laterality: N/A;  . THYROIDECTOMY    . UMBILICAL HERNIA REPAIR  09/07/2012   Procedure: HERNIA REPAIR UMBILICAL ADULT;  Surgeon: Adolph Pollackodd J Rosenbower, MD;  Location: [facility];  Service: General;  Laterality: N/A;  Repair of Incisional Incarcarated hernia    Current Outpatient Medications  Medication Sig Dispense Refill  . amLODipine (NORVASC) 10 MG tablet amlodipine 10 mg tablet  Take 1 tablet every day by oral route.    Marland Kitchen. atenolol (TENORMIN) 25 MG tablet Take 1 tablet by mouth daily.    . cholecalciferol (VITAMIN D) 1000 units tablet Take 1,000 Units by mouth daily.    . ferrous sulfate 325 (65 FE) MG tablet Take 1 tablet (325 mg total) by mouth daily. (Patient taking differently: Take 325 mg by mouth 3 (three) times daily. ) 30 tablet 0  . levothyroxine (SYNTHROID, LEVOTHROID) 200 MCG tablet levothyroxine 200 mcg tablet  Take 1 tablet every day by oral route for  30 days.    Marland Kitchen lisinopril (PRINIVIL,ZESTRIL) 20 MG tablet lisinopril 20 mg tablet  TAKE 1 TABLET BY MOUTH DAILY    . lovastatin (MEVACOR) 40 MG tablet Take 40 mg by mouth at bedtime.    . metFORMIN (GLUCOPHAGE) 1000 MG tablet Take 1,000 mg by mouth 2 (two) times daily with a meal.    . Norgestimate-Ethinyl Estradiol Triphasic (TRI-SPRINTEC) 0.18/0.215/0.25 MG-35 MCG tablet Take 1 tablet by mouth  daily.    . Omega-3 Fatty Acids (FISH OIL) 1000 MG CAPS Take 1 capsule by mouth daily.     . vitamin C (ASCORBIC ACID) 500 MG tablet Take 500 mg by mouth daily.     No current facility-administered medications for this visit.     Allergies:   Aspirin   Social History:  The patient  reports that she has never smoked. She has never used smokeless tobacco. She reports that she does not drink alcohol or use drugs.   Family History:  The patient's  family history includes Asthma in her father; Diabetes in her father; Hypertension in her father and mother.    ROS:  Please see the history of present illness.   All other systems are personally reviewed and negative.    Exam:    Vital Signs:  Ht 5\' 3"  (1.6 m)   Wt 246 lb (111.6 kg)   BMI 43.58 kg/m    Well appearing, alert and conversant, regular work of breathing,  good skin color Eyes- anicteric, neuro- grossly intact, skin- no apparent rash or lesions or cyanosis, mouth- oral mucosa is pink   Labs/Other Tests and Data Reviewed:    Recent Labs: 12/12/2018: BUN 10; Creatinine, Ser 0.71; Hemoglobin 13.6; Magnesium 1.7; Platelets 179; Potassium 3.4; Sodium 137; TSH 4.170   Wt Readings from Last 3 Encounters:  05/16/19 246 lb (111.6 kg)  12/12/18 200 lb (90.7 kg)  04/28/18 238 lb 11.2 oz (108.3 kg)     Other studies personally reviewed: Additional studies/ records that were reviewed today include: recent ED visit (2/20),  All ekgs in epic     ASSESSMENT & PLAN:    1.  Palpitations ED notes describe SVT and adenosine termination of her arrhythmia.  Unfortunately, I do not see documentation of her arrhythmia, even in EMS run sheet.  She is currently doing much better with atenolol. I have therefore made no changes today.  Could consider verapamil or diltiazem should her arrhythmia return. Therapeutic strategies for supraventricular tachycardia including medicine and ablation were discussed in detail with the patient today. Risk,  benefits, and alternatives to EP study and radiofrequency ablation were also discussed in detail today. She does not have insurance.  I have strongly encouraged her to pursue insurance so that we could consider ablation.  2. HTN Stable No change required today   Follow-up:  EP NP in 6 weeks  Current medicines are reviewed at length with the patient today.   The patient does not have concerns regarding her medicines.  The following changes were made today:  none  Today, I have spent 15 minutes with the patient with telehealth technology discussing SVT .    Signed, Thompson Grayer MD, [place] 05/16/2019 11:14 AM   [facility] [address] [place] [address] [phone] (office) [phone] (fax)

## 2019-07-04 ENCOUNTER — Ambulatory Visit: Payer: Self-pay | Admitting: Nurse Practitioner

## 2019-08-26 ENCOUNTER — Telehealth: Payer: Self-pay | Admitting: Internal Medicine

## 2019-08-26 NOTE — Telephone Encounter (Signed)
Patient called to reschedule missed 01/09 appointment, per patient's request appointment has moved to 11/09.

## 2019-08-29 ENCOUNTER — Encounter: Payer: Self-pay | Admitting: Internal Medicine

## 2019-08-29 ENCOUNTER — Other Ambulatory Visit: Payer: Self-pay | Admitting: Medical Oncology

## 2019-08-29 ENCOUNTER — Inpatient Hospital Stay: Payer: Self-pay | Attending: Internal Medicine

## 2019-08-29 ENCOUNTER — Other Ambulatory Visit: Payer: Self-pay

## 2019-08-29 ENCOUNTER — Inpatient Hospital Stay (HOSPITAL_BASED_OUTPATIENT_CLINIC_OR_DEPARTMENT_OTHER): Payer: Self-pay | Admitting: Internal Medicine

## 2019-08-29 VITALS — BP 139/89 | HR 79 | Temp 98.2°F | Resp 18 | Ht 63.0 in | Wt 233.2 lb

## 2019-08-29 DIAGNOSIS — E538 Deficiency of other specified B group vitamins: Secondary | ICD-10-CM

## 2019-08-29 DIAGNOSIS — J45909 Unspecified asthma, uncomplicated: Secondary | ICD-10-CM | POA: Insufficient documentation

## 2019-08-29 DIAGNOSIS — E119 Type 2 diabetes mellitus without complications: Secondary | ICD-10-CM | POA: Insufficient documentation

## 2019-08-29 DIAGNOSIS — D509 Iron deficiency anemia, unspecified: Secondary | ICD-10-CM | POA: Insufficient documentation

## 2019-08-29 DIAGNOSIS — D649 Anemia, unspecified: Secondary | ICD-10-CM

## 2019-08-29 DIAGNOSIS — Z79899 Other long term (current) drug therapy: Secondary | ICD-10-CM | POA: Insufficient documentation

## 2019-08-29 DIAGNOSIS — E079 Disorder of thyroid, unspecified: Secondary | ICD-10-CM | POA: Insufficient documentation

## 2019-08-29 DIAGNOSIS — I10 Essential (primary) hypertension: Secondary | ICD-10-CM | POA: Insufficient documentation

## 2019-08-29 LAB — FOLATE: Folate: 10.2 ng/mL (ref >5.9)

## 2019-08-29 LAB — CBC WITH DIFFERENTIAL (CANCER CENTER ONLY)
Abs Immature Granulocytes: 0.05 10*3/uL (ref 0.00–0.07)
Basophils Absolute: 0 10*3/uL (ref 0.0–0.1)
Basophils Relative: 1 %
Eosinophils Absolute: 0.2 10*3/uL (ref 0.0–0.5)
Eosinophils Relative: 3 %
HCT: 42.5 % (ref 36.0–46.0)
Hemoglobin: 12.6 g/dL (ref 12.0–15.0)
Immature Granulocytes: 1 %
Lymphocytes Relative: 34 %
Lymphs Abs: 2.3 10*3/uL (ref 0.7–4.0)
MCH: 22.7 pg — ABNORMAL LOW (ref 26.0–34.0)
MCHC: 29.6 g/dL — ABNORMAL LOW (ref 30.0–36.0)
MCV: 76.6 fL — ABNORMAL LOW (ref 80.0–100.0)
Monocytes Absolute: 0.5 10*3/uL (ref 0.1–1.0)
Monocytes Relative: 8 %
Neutro Abs: 3.8 10*3/uL (ref 1.7–7.7)
Neutrophils Relative %: 53 %
Platelet Count: 197 10*3/uL (ref 150–400)
RBC: 5.55 MIL/uL — ABNORMAL HIGH (ref 3.87–5.11)
RDW: 13.6 % (ref 11.5–15.5)
WBC Count: 6.8 10*3/uL (ref 4.0–10.5)
nRBC: 0 % (ref 0.0–0.2)

## 2019-08-29 LAB — CMP (CANCER CENTER ONLY)
ALT: 13 U/L (ref 0–44)
AST: 13 U/L — ABNORMAL LOW (ref 15–41)
Albumin: 3.9 g/dL (ref 3.5–5.0)
Alkaline Phosphatase: 51 U/L (ref 38–126)
Anion gap: 9 (ref 5–15)
BUN: 8 mg/dL (ref 6–20)
CO2: 27 mmol/L (ref 22–32)
Calcium: 8.7 mg/dL — ABNORMAL LOW (ref 8.9–10.3)
Chloride: 103 mmol/L (ref 98–111)
Creatinine: 0.73 mg/dL (ref 0.44–1.00)
GFR, Est AFR Am: >60 mL/min (ref >60)
GFR, Estimated: >60 mL/min (ref >60)
Glucose, Bld: 221 mg/dL — ABNORMAL HIGH (ref 70–99)
Potassium: 4 mmol/L (ref 3.5–5.1)
Sodium: 139 mmol/L (ref 135–145)
Total Bilirubin: 0.7 mg/dL (ref 0.3–1.2)
Total Protein: 7.1 g/dL (ref 6.5–8.1)

## 2019-08-29 LAB — VITAMIN B12: Vitamin B-12: 713 pg/mL (ref 180–914)

## 2019-08-29 NOTE — Progress Notes (Signed)
[place] Telephone:([phone]) [phone]   Fax:([phone]) [phone]  OFFICE PROGRESS NOTE  [place], [place] [address]  DIAGNOSIS: Microcytic anemia with a component of iron deficiency.  The patient also has vitamin B12 deficiency.  PRIOR THERAPY: None  CURRENT THERAPY: 1) ferrous sulfate 325 mg 2-3 times a day. 2) vitamin B12 1000 mcg by mouth daily.   INTERVAL HISTORY: Kylie Baker 43 y.o. female returns to the clinic today for follow-up visit.  The patient is feeling fine today with no concerning complaints.  She denied having any current fatigue or weakness.  She denied having any nausea, vomiting, diarrhea or constipation.  She has no headache or visual changes.  She is currently on oral iron tablets in addition to vitamin B12 and tolerating her treatment well.  She is here today for evaluation and repeat blood work.  MEDICAL HISTORY: Past Medical History:  Diagnosis Date  . Asthma   . Diabetes ([place]) 07/11/2017  . Gestational diabetes 01/25/2012  . Hypertension   . Palpitations   . Pregnancy induced hypertension   . Thyroid disease     ALLERGIES:  is allergic to aspirin.  MEDICATIONS:  Current Outpatient Medications  Medication Sig Dispense Refill  . amLODipine (NORVASC) 10 MG tablet amlodipine 10 mg tablet  Take 1 tablet every day by oral route.    Marland Kitchen atenolol (TENORMIN) 25 MG tablet Take 1 tablet by mouth daily.    . B Complex Vitamins (B-COMPLEX/B-12 PO) Take by mouth.    . cholecalciferol (VITAMIN D) 1000 units tablet Take 1,000 Units by mouth daily.    . ferrous sulfate 325 (65 FE) MG tablet Take 1 tablet (325 mg total) by mouth daily. (Patient taking differently: Take 325 mg by mouth 3 (three) times daily. ) 30 tablet 0  . levothyroxine (SYNTHROID, LEVOTHROID) 200 MCG tablet levothyroxine 200 mcg tablet  Take 1 tablet every day by oral route for 30 days.    Marland Kitchen lisinopril (PRINIVIL,ZESTRIL) 20 MG tablet lisinopril 20  mg tablet  TAKE 1 TABLET BY MOUTH DAILY    . lovastatin (MEVACOR) 40 MG tablet Take 40 mg by mouth at bedtime.    . metFORMIN (GLUCOPHAGE) 1000 MG tablet Take 1,000 mg by mouth 2 (two) times daily with a meal.    . Norgestimate-Ethinyl Estradiol Triphasic (TRI-SPRINTEC) 0.18/0.215/0.25 MG-35 MCG tablet Take 1 tablet by mouth daily.    . Omega-3 Fatty Acids (FISH OIL) 1000 MG CAPS Take 1 capsule by mouth daily.     . vitamin C (ASCORBIC ACID) 500 MG tablet Take 500 mg by mouth daily.     No current facility-administered medications for this visit.     SURGICAL HISTORY:  Past Surgical History:  Procedure Laterality Date  . APPENDECTOMY  1980's  . CESAREAN SECTION  06/06/2010  . CESAREAN SECTION  03/27/2012   Procedure: CESAREAN SECTION;  Surgeon: Frederico Hamman, MD;  Location: [place] [facility];  Service: Gynecology;  Laterality: N/A;  Repeat Cesarean Section Delivery   Boy  @ 0005 , Apgars 8/9  . CHOLECYSTECTOMY  04/22/2012   Procedure: LAPAROSCOPIC CHOLECYSTECTOMY WITH INTRAOPERATIVE CHOLANGIOGRAM;  Surgeon: Edward Jolly, MD;  [facility];  Service: General;  Laterality: N/A;  . DILATION AND EVACUATION N/A 07/24/2017   Procedure: DILATATION AND EVACUATION;  Surgeon: Jonnie Kind, MD;  Location: [place] [facility];  Service: Gynecology;  Laterality: N/A;  . THYROIDECTOMY    . UMBILICAL HERNIA REPAIR  09/07/2012  Procedure: HERNIA REPAIR UMBILICAL ADULT;  Surgeon: Adolph Pollack, MD;  [facility];  Service: General;  Laterality: N/A;  Repair of Incisional Incarcarated hernia    REVIEW OF SYSTEMS:  A comprehensive review of systems was negative.   PHYSICAL EXAMINATION: General appearance: alert, cooperative and no distress Head: Normocephalic, without obvious abnormality, atraumatic Neck: no adenopathy, no JVD, supple, symmetrical, trachea midline and thyroid not enlarged, symmetric, no tenderness/mass/nodules Lymph nodes: Cervical, supraclavicular, and axillary nodes normal. Resp:  clear to auscultation bilaterally Back: symmetric, no curvature. ROM normal. No CVA tenderness. Cardio: regular rate and rhythm, S1, S2 normal, no murmur, click, rub or gallop GI: soft, non-tender; bowel sounds normal; no masses,  no organomegaly Extremities: extremities normal, atraumatic, no cyanosis or edema  ECOG PERFORMANCE STATUS: 1 - Symptomatic but completely ambulatory  Blood pressure 139/89, pulse 79, temperature 98.2 F (36.8 C), temperature source Temporal, resp. rate 18, height 5\' 3"  (1.6 m), weight 233 lb 3.2 oz (105.8 kg), SpO2 100 %, unknown if currently breastfeeding.  LABORATORY DATA: Lab Results  Component Value Date   WBC 6.8 08/29/2019   HGB 12.6 08/29/2019   HCT 42.5 08/29/2019   MCV 76.6 (L) 08/29/2019   PLT 197 08/29/2019      Chemistry      Component Value Date/Time   NA 137 12/12/2018 1452   K 3.4 (L) 12/12/2018 1452   CL 103 12/12/2018 1452   CO2 25 12/12/2018 1452   BUN 10 12/12/2018 1452   CREATININE 0.71 12/12/2018 1452   CREATININE 0.67 03/24/2018 1236      Component Value Date/Time   CALCIUM 8.4 (L) 12/12/2018 1452   ALKPHOS 58 03/24/2018 1236   AST 16 03/24/2018 1236   ALT 10 03/24/2018 1236   BILITOT 0.6 03/24/2018 1236       RADIOGRAPHIC STUDIES: No results found.  ASSESSMENT AND PLAN: This is a very pleasant 43 years old African American female with history of mild anemia likely secondary to vitamin B12 deficiency.  The patient is feeling fine today with no concerning complaints. Repeat CBC today showed normal hemoglobin and hematocrit but she continues to have microcytosis of unclear etiology.  She underwent several studies in the past that were unrevealing for the etiology of her microcytosis. I recommended for the patient to continue her current treatment with vitamin B12 as well as oral iron tablets. I do not see the need for the patient to continue routine follow-up visit at the cancer center at this point but I will be happy to  see her in the future if needed. She was advised to call if she has any concerning symptoms. The patient voices understanding of current disease status and treatment options and is in agreement with the current care plan.  All questions were answered. The patient knows to call the clinic with any problems, questions or concerns. We can certainly see the patient much sooner if necessary.  I spent 10 minutes counseling the patient face to face. The total time spent in the appointment was 15 minutes.  Disclaimer: This note was dictated with voice recognition software. Similar sounding words can inadvertently be transcribed and may not be corrected upon review.

## 2019-09-02 ENCOUNTER — Encounter: Payer: Self-pay | Admitting: Internal Medicine

## 2021-01-03 ENCOUNTER — Telehealth: Payer: Self-pay

## 2021-01-03 NOTE — Telephone Encounter (Signed)
[facility] Patient Advocate Encounter ? ?Insurance verification completed.   ? ?The patient is uninsured and will need patient assistance for medication. ? ?We can complete the application and will need to meet with the patient for signatures and income documentation. ? ?Shirle Provencal, CPhT ?Specialty Pharmacy Patient Advocate ?[facility] ?Phone: [phone] ?Fax:  [phone]  ?

## 2021-01-07 ENCOUNTER — Other Ambulatory Visit (HOSPITAL_COMMUNITY): Payer: Self-pay | Admitting: Pharmacist

## 2021-01-07 ENCOUNTER — Telehealth: Payer: Self-pay

## 2021-01-07 ENCOUNTER — Other Ambulatory Visit: Payer: Self-pay

## 2021-01-07 ENCOUNTER — Encounter: Payer: Self-pay | Admitting: Family

## 2021-01-07 ENCOUNTER — Ambulatory Visit (INDEPENDENT_AMBULATORY_CARE_PROVIDER_SITE_OTHER): Payer: Self-pay | Admitting: Family

## 2021-01-07 VITALS — Ht 63.0 in | Wt 243.0 lb

## 2021-01-07 DIAGNOSIS — Z113 Encounter for screening for infections with a predominantly sexual mode of transmission: Secondary | ICD-10-CM

## 2021-01-07 DIAGNOSIS — Z7251 High risk heterosexual behavior: Secondary | ICD-10-CM

## 2021-01-07 LAB — POCT URINE PREGNANCY: Preg Test, Ur: NEGATIVE

## 2021-01-07 MED ORDER — TRUVADA 200-300 MG PO TABS: 1.0000 | ORAL_TABLET | Freq: Every day | ORAL | 2 refills | Status: DC

## 2021-01-07 NOTE — Progress Notes (Signed)
Subjective:    Patient ID: Kylie Baker, female    DOB: 1976-09-01, 45 y.o.   MRN: [id number]  Chief Complaint  Patient presents with  . PrEP    HPI:  Kylie Baker is a 45 y.o. female with previous medical history as detailed below presenting today to discuss starting preexposure prophylaxis for HIV.  Kylie Baker is interested in starting on preexposure prophylaxis. Risk factor is heterosexual contact as her husband is HIV positive and in care taking medications. Most recent HIV test on 12/25/20 through the [facility] was negative. Has several concerns about potential side effects and medication interactions.   Allergies  Allergen Reactions  . Aspirin Itching and Rash      Outpatient Medications Prior to Visit  Medication Sig Dispense Refill  . amLODipine (NORVASC) 10 MG tablet amlodipine 10 mg tablet  Take 1 tablet every day by oral route.    Marland Kitchen atenolol (TENORMIN) 25 MG tablet Take 1 tablet by mouth daily.    . B Complex Vitamins (B-COMPLEX/B-12 PO) Take by mouth.    . cholecalciferol (VITAMIN D) 1000 units tablet Take 1,000 Units by mouth daily.    . ferrous sulfate 325 (65 FE) MG tablet Take 1 tablet (325 mg total) by mouth daily. (Patient taking differently: Take 325 mg by mouth 3 (three) times daily.) 30 tablet 0  . insulin NPH Human (NOVOLIN N) 100 UNIT/ML injection Novolin N NPH U-100 Insulin isophane 100 unit/mL subcutaneous susp  INJECT 10 UNITS SUBCUTANEOUSLY ONCE DAILY AT BEDTIME    . levothyroxine (SYNTHROID, LEVOTHROID) 200 MCG tablet levothyroxine 200 mcg tablet  Take 1 tablet every day by oral route for 30 days.    Marland Kitchen lisinopril (PRINIVIL,ZESTRIL) 20 MG tablet lisinopril 20 mg tablet  TAKE 1 TABLET BY MOUTH DAILY    . metFORMIN (GLUCOPHAGE) 1000 MG tablet Take 1,000 mg by mouth 2 (two) times daily with a meal.    . Norgestimate-Ethinyl Estradiol Triphasic 0.18/0.215/0.25 MG-35 MCG tablet Take 1 tablet by mouth daily.    . Omega-3 Fatty Acids (FISH  OIL) 1000 MG CAPS Take 1 capsule by mouth daily.     . vitamin C (ASCORBIC ACID) 500 MG tablet Take 500 mg by mouth daily.    Marland Kitchen lovastatin (MEVACOR) 40 MG tablet Take 40 mg by mouth at bedtime.    Marland Kitchen atorvastatin (LIPITOR) 40 MG tablet atorvastatin 40 mg tablet    . glipiZIDE (GLUCOTROL) 10 MG tablet glipizide 10 mg tablet     No facility-administered medications prior to visit.     Past Medical History:  Diagnosis Date  . Asthma   . Diabetes (HCC) 07/11/2017  . Gestational diabetes 01/25/2012  . Hypertension   . Palpitations   . Pregnancy induced hypertension   . Thyroid disease       Past Surgical History:  Procedure Laterality Date  . APPENDECTOMY  1980's  . CESAREAN SECTION  06/06/2010  . CESAREAN SECTION  03/27/2012   Procedure: CESAREAN SECTION;  Surgeon: Kathreen Cosier, MD;  Location: [facility];  Service: Gynecology;  Laterality: N/A;  Repeat Cesarean Section Delivery   Boy  @ 0005 , Apgars 8/9  . CHOLECYSTECTOMY  04/22/2012   Procedure: LAPAROSCOPIC CHOLECYSTECTOMY WITH INTRAOPERATIVE CHOLANGIOGRAM;  Surgeon: Mariella Saa, MD;  Location: [facility];  Service: General;  Laterality: N/A;  . DILATION AND EVACUATION N/A 07/24/2017   Procedure: DILATATION AND EVACUATION;  Surgeon: Tilda Burrow, MD;  Location: [facility];  Service: Gynecology;  Laterality: N/A;  .  THYROIDECTOMY    . UMBILICAL HERNIA REPAIR  09/07/2012   Procedure: HERNIA REPAIR UMBILICAL ADULT;  Surgeon: Adolph Pollack, MD;  Location: [facility];  Service: General;  Laterality: N/A;  Repair of Incisional Incarcarated hernia      Family History  Problem Relation Age of Onset  . Hypertension Mother   . Diabetes Father   . Asthma Father   . Hypertension Father   . Anesthesia problems Neg Hx       Social History   Socioeconomic History  . Marital status: Married    Spouse name: Not on file  . Number of children: Not on file  . Years of education: Not on file  . Highest education level: Not on file   Occupational History  . Not on file  Tobacco Use  . Smoking status: Never Smoker  . Smokeless tobacco: Never Used  Vaping Use  . Vaping Use: Never used  Substance and Sexual Activity  . Alcohol use: No  . Drug use: No  . Sexual activity: Yes    Birth control/protection: None  Other Topics Concern  . Not on file  Social History Narrative   Lives in [place] with spouse and children.  Unemployed.   Social Determinants of Health   Financial Resource Strain: Not on file  Food Insecurity: Not on file  Transportation Needs: Not on file  Physical Activity: Not on file  Stress: Not on file  Social Connections: Not on file  Intimate Partner Violence: Not on file      Review of Systems  Constitutional: Negative for appetite change, chills, diaphoresis, fatigue, fever and unexpected weight change.  Eyes:       Negative for acute change in vision  Respiratory: Negative for chest tightness, shortness of breath and wheezing.   Cardiovascular: Negative for chest pain.  Gastrointestinal: Negative for diarrhea, nausea and vomiting.  Genitourinary: Negative for dysuria, pelvic pain and vaginal discharge.  Musculoskeletal: Negative for neck pain and neck stiffness.  Skin: Negative for rash.  Neurological: Negative for seizures, syncope, weakness and headaches.  Hematological: Negative for adenopathy. Does not bruise/bleed easily.  Psychiatric/Behavioral: Negative for hallucinations.       Objective:    Ht 5\' 3"  (1.6 m)   Wt 243 lb (110.2 kg)   BMI 43.05 kg/m  Nursing note and vital signs reviewed.  Physical Exam Constitutional:      General: She is not in acute distress.    Appearance: She is well-developed.  Eyes:     Conjunctiva/sclera: Conjunctivae normal.  Cardiovascular:     Rate and Rhythm: Normal rate and regular rhythm.     Heart sounds: Normal heart sounds. No murmur heard. No friction rub. No gallop.   Pulmonary:     Effort: Pulmonary effort is normal. No  respiratory distress.     Breath sounds: Normal breath sounds. No wheezing or rales.  Chest:     Chest wall: No tenderness.  Abdominal:     General: Bowel sounds are normal.     Palpations: Abdomen is soft.     Tenderness: There is no abdominal tenderness.  Musculoskeletal:     Cervical back: Neck supple.  Lymphadenopathy:     Cervical: No cervical adenopathy.  Skin:    General: Skin is warm and dry.     Findings: No rash.  Neurological:     Mental Status: She is alert and oriented to person, place, and time.  Psychiatric:        Behavior:  Behavior normal.        Thought Content: Thought content normal.        Judgment: Judgment normal.         Assessment & Plan:   Patient Active Problem List   Diagnosis Date Noted  . High risk heterosexual behavior 01/07/2021  . Vitamin B 12 deficiency 04/28/2018  . Iron deficiency anemia 04/28/2018  . Anemia 04/28/2018  . Left elbow pain 11/04/2017  . Syncope 07/20/2017  . Symptomatic anemia 07/20/2017  . Incomplete abortion with delayed or excessive hemorrhage 07/20/2017  . Type 2 diabetes mellitus affecting pregnancy in first trimester, antepartum 07/11/2017  . Goiter, nodular 07/04/2015  . Vitamin D deficiency 03/27/2015  . Unspecified essential hypertension 04/13/2014  . Hypercalcemia 02/09/2014  . Subclinical hyperthyroidism 02/09/2014  . Incarcerated incisional hernia-s/p repair 09/07/12 10/05/2012  . Supervision of high-risk pregnancy 01/25/2012  . Gestational diabetes 01/25/2012  . Chronic hypertension complicating or reason for care during pregnancy 01/25/2012  . Asthma 01/25/2012     Problem List Items Addressed This Visit      Other   High risk heterosexual behavior - Primary    Ms. Lievanos is a 45 y/o AA female wishing to start PrEP with risk factor of heterosexual contact and husband who is HIV positive and well controlled on medication. Most recent HIV test negative. Discussed the risks and benefits of PrEP  and the importance of safe sexual practice to reduce the risk of STI. Check lab work today. Pharmacy obtained Advancing Access for PrEP and prescription for Truvada sent. Plan for follow up in 3 months or sooner if needed.       Relevant Orders   Basic metabolic panel   Hepatitis B surface antibody,qualitative   Hepatitis B surface antigen   Hepatitis C antibody   RPR   HIV antibody (with reflex)   POCT urine pregnancy (Completed)    Other Visit Diagnoses    Screening for STDs (sexually transmitted diseases)       Relevant Orders   RPR   Urine cytology ancillary only([place])   POCT urine pregnancy (Completed)       I have discontinued Lavaughn Axford's lovastatin. I am also having her start on Truvada. Additionally, I am having her maintain her ferrous sulfate, amLODipine, lisinopril, Norgestimate-Ethinyl Estradiol Triphasic, Fish Oil, vitamin C, cholecalciferol, levothyroxine, atenolol, metFORMIN, B Complex Vitamins (B-COMPLEX/B-12 PO), insulin NPH Human, atorvastatin, and glipiZIDE.   Meds ordered this encounter  Medications  . TRUVADA 200-300 MG tablet    Sig: Take 1 tablet by mouth daily.    Dispense:  30 tablet    Refill:  2    [employer] Advancing [employer] BIN# [id number] PCN: [id number] Group [id number] Member # [id number]    Order Specific Question:   Supervising Provider    Answer:   Judyann Munson [[id number]]     Follow-up: Return in about 3 months (around 04/09/2021), or if symptoms worsen or fail to improve.    Marcos Eke, MSN, FNP-C Nurse Practitioner [facility] Main number: [phone]

## 2021-01-07 NOTE — Patient Instructions (Signed)
Nice to see you.  We will get you started on Truvada - take 1 tablet by mouth daily.  Copay information has been sent to the pharmacy.  We will check your lab work today.   Plan for follow up in 3 months or sooner if needed with Cassie Kuppelweiser  Have a great day and stay safe!

## 2021-01-07 NOTE — Telephone Encounter (Addendum)
[facility] Patient Advocate Encounter  Completed and sent [employer] Advancing Access application for Truvada for this patient who is uninsured.    Patient is approved 01/07/21 to 10/19/21.  BIN      [id number] PCN    [id number] GRP    [id number] ID        [id number]   Clearance Coots, CPhT Specialty Pharmacy Patient [facility] Phone: [phone] Fax:  [phone]

## 2021-01-07 NOTE — Assessment & Plan Note (Signed)
Ms. Kattner is a 45 y/o AA female wishing to start PrEP with risk factor of heterosexual contact and husband who is HIV positive and well controlled on medication. Most recent HIV test negative. Discussed the risks and benefits of PrEP and the importance of safe sexual practice to reduce the risk of STI. Check lab work today. Pharmacy obtained Advancing Access for PrEP and prescription for Truvada sent. Plan for follow up in 3 months or sooner if needed.

## 2021-01-08 LAB — URINE CYTOLOGY ANCILLARY ONLY
Chlamydia: NEGATIVE
Comment: NEGATIVE
Comment: NORMAL
Neisseria Gonorrhea: NEGATIVE

## 2021-01-08 LAB — BASIC METABOLIC PANEL
BUN: 9 mg/dL (ref 7–25)
CO2: 29 mmol/L (ref 20–32)
Calcium: 8.7 mg/dL (ref 8.6–10.2)
Chloride: 100 mmol/L (ref 98–110)
Creat: 0.51 mg/dL (ref 0.50–1.10)
Glucose, Bld: 366 mg/dL — ABNORMAL HIGH (ref 65–99)
Potassium: 4.4 mmol/L (ref 3.5–5.3)
Sodium: 138 mmol/L (ref 135–146)

## 2021-01-08 LAB — HIV ANTIBODY (ROUTINE TESTING W REFLEX): HIV 1&2 Ab, 4th Generation: NONREACTIVE

## 2021-01-08 LAB — RPR: RPR Ser Ql: NONREACTIVE

## 2021-01-08 LAB — HEPATITIS C ANTIBODY
Hepatitis C Ab: NONREACTIVE
SIGNAL TO CUT-OFF: 0.01 (ref <1.00)

## 2021-01-08 LAB — HEPATITIS B SURFACE ANTIGEN: Hepatitis B Surface Ag: NONREACTIVE

## 2021-01-08 LAB — HEPATITIS B SURFACE ANTIBODY,QUALITATIVE: Hep B S Ab: NONREACTIVE

## 2021-01-09 MED FILL — TRUVADA 200-300 MG TABS: 200-300 | 30 days supply | Qty: 30 | Fill #0

## 2021-01-15 ENCOUNTER — Other Ambulatory Visit (HOSPITAL_COMMUNITY): Payer: Self-pay

## 2021-01-15 NOTE — Progress Notes (Signed)
Patient informed of lab results  collected on 01/07/21 and confirmed next follow up visit.

## 2021-01-30 ENCOUNTER — Other Ambulatory Visit: Payer: Self-pay | Admitting: Pharmacist

## 2021-01-30 ENCOUNTER — Other Ambulatory Visit (HOSPITAL_COMMUNITY): Payer: Self-pay

## 2021-01-30 MED ORDER — EMTRICITABINE-TENOFOVIR DF 200-300 MG PO TABS
1.0000 | ORAL_TABLET | Freq: Every day | ORAL | 1 refills | Status: DC
  Filled 2021-01-30: qty 30, 30d supply, fill #0
  Filled 2021-03-04: qty 30, 30d supply, fill #1

## 2021-02-06 ENCOUNTER — Other Ambulatory Visit (HOSPITAL_COMMUNITY): Payer: Self-pay

## 2021-03-04 ENCOUNTER — Other Ambulatory Visit (HOSPITAL_COMMUNITY): Payer: Self-pay

## 2021-03-09 ENCOUNTER — Other Ambulatory Visit (HOSPITAL_COMMUNITY): Payer: Self-pay

## 2021-03-11 ENCOUNTER — Other Ambulatory Visit (HOSPITAL_COMMUNITY): Payer: Self-pay

## 2021-04-03 ENCOUNTER — Other Ambulatory Visit (HOSPITAL_COMMUNITY): Payer: Self-pay

## 2021-04-08 ENCOUNTER — Other Ambulatory Visit: Payer: Self-pay

## 2021-04-08 ENCOUNTER — Other Ambulatory Visit: Payer: Self-pay | Admitting: Pharmacist

## 2021-04-08 ENCOUNTER — Ambulatory Visit: Payer: Self-pay | Admitting: Pharmacist

## 2021-04-08 ENCOUNTER — Other Ambulatory Visit (HOSPITAL_COMMUNITY): Payer: Self-pay

## 2021-04-08 DIAGNOSIS — Z7251 High risk heterosexual behavior: Secondary | ICD-10-CM

## 2021-04-08 NOTE — Progress Notes (Signed)
Date:  04/08/2021   HPI: Kylie Baker is a 45 y.o. female who presents to the [facility] pharmacy clinic for HIV PrEP follow-up.  Insured   []    Uninsured  [x]    Patient Active Problem List   Diagnosis Date Noted   High risk heterosexual behavior 01/07/2021   Vitamin B 12 deficiency 04/28/2018   Iron deficiency anemia 04/28/2018   Anemia 04/28/2018   Left elbow pain 11/04/2017   Syncope 07/20/2017   Symptomatic anemia 07/20/2017   Incomplete abortion with delayed or excessive hemorrhage 07/20/2017   Type 2 diabetes mellitus affecting pregnancy in first trimester, antepartum 07/11/2017   Goiter, nodular 07/04/2015   Vitamin D deficiency 03/27/2015   Unspecified essential hypertension 04/13/2014   Hypercalcemia 02/09/2014   Subclinical hyperthyroidism 02/09/2014   Incarcerated incisional hernia-s/p repair 09/07/12 10/05/2012   Supervision of high-risk pregnancy 01/25/2012   Gestational diabetes 01/25/2012   Chronic hypertension complicating or reason for care during pregnancy 01/25/2012   Asthma 01/25/2012    Patient's Medications  New Prescriptions   No medications on file  Previous Medications   AMLODIPINE (NORVASC) 10 MG TABLET    amlodipine 10 mg tablet  Take 1 tablet every day by oral route.   ATENOLOL (TENORMIN) 25 MG TABLET    Take 1 tablet by mouth daily.   ATORVASTATIN (LIPITOR) 40 MG TABLET    atorvastatin 40 mg tablet   B COMPLEX VITAMINS (B-COMPLEX/B-12 PO)    Take by mouth.   CHOLECALCIFEROL (VITAMIN D) 1000 UNITS TABLET    Take 1,000 Units by mouth daily.   EMTRICITABINE-TENOFOVIR (TRUVADA) 200-300 MG TABLET    TAKE 1 TABLET BY MOUTH DAILY   FERROUS SULFATE 325 (65 FE) MG TABLET    Take 1 tablet (325 mg total) by mouth daily.   GLIPIZIDE (GLUCOTROL) 10 MG TABLET    glipizide 10 mg tablet   INSULIN NPH HUMAN (NOVOLIN N) 100 UNIT/ML INJECTION    Novolin N NPH U-100 Insulin isophane 100 unit/mL subcutaneous susp  INJECT 10 UNITS SUBCUTANEOUSLY ONCE DAILY AT BEDTIME    LEVOTHYROXINE (SYNTHROID, LEVOTHROID) 200 MCG TABLET    levothyroxine 200 mcg tablet  Take 1 tablet every day by oral route for 30 days.   LISINOPRIL (PRINIVIL,ZESTRIL) 20 MG TABLET    lisinopril 20 mg tablet  TAKE 1 TABLET BY MOUTH DAILY   METFORMIN (GLUCOPHAGE) 1000 MG TABLET    Take 1,000 mg by mouth 2 (two) times daily with a meal.   NORGESTIMATE-ETHINYL ESTRADIOL TRIPHASIC 0.18/0.215/0.25 MG-35 MCG TABLET    Take 1 tablet by mouth daily.   OMEGA-3 FATTY ACIDS (FISH OIL) 1000 MG CAPS    Take 1 capsule by mouth daily.    TRUVADA 200-300 MG TABLET    Take 1 tablet by mouth daily.   VITAMIN C (ASCORBIC ACID) 500 MG TABLET    Take 500 mg by mouth daily.  Modified Medications   No medications on file  Discontinued Medications   No medications on file    Allergies: Allergies  Allergen Reactions   Aspirin Itching and Rash    Past Medical History: Past Medical History:  Diagnosis Date   Asthma    Diabetes (HCC) 07/11/2017   Gestational diabetes 01/25/2012   Hypertension    Palpitations    Pregnancy induced hypertension    Thyroid disease     Social History: Social History   Socioeconomic History   Marital status: Married    Spouse name: Not on file   Number of  children: Not on file   Years of education: Not on file   Highest education level: Not on file  Occupational History   Not on file  Tobacco Use   Smoking status: Never   Smokeless tobacco: Never  Vaping Use   Vaping Use: Never used  Substance and Sexual Activity   Alcohol use: No   Drug use: No   Sexual activity: Yes    Birth control/protection: None  Other Topics Concern   Not on file  Social History Narrative   Lives in [place] with spouse and children.  Unemployed.   Social Determinants of Health   Financial Resource Strain: Not on file  Food Insecurity: Not on file  Transportation Needs: Not on file  Physical Activity: Not on file  Stress: Not on file  Social Connections: Not on file     No flowsheet data found.  Labs:  SCr: Lab Results  Component Value Date   CREATININE 0.51 01/07/2021   CREATININE 0.73 08/29/2019   CREATININE 0.71 12/12/2018   CREATININE 0.67 03/24/2018   CREATININE 0.65 11/03/2017   HIV Lab Results  Component Value Date   HIV NON-REACTIVE 01/07/2021   HIV Non Reactive 07/11/2017   HIV Non-reactive 11/10/2011   Hepatitis B Lab Results  Component Value Date   HEPBSAB NON-REACTIVE 01/07/2021   HEPBSAG NON-REACTIVE 01/07/2021   Hepatitis C Lab Results  Component Value Date   HEPCAB NON-REACTIVE 01/07/2021   Hepatitis A No results found for: HAV RPR and STI Lab Results  Component Value Date   LABRPR NON-REACTIVE 01/07/2021   LABRPR NON REACTIVE 03/26/2012   LABRPR Nonreactive 11/10/2011   LABRPR NON REACTIVE 06/06/2010    STI Results GC CT  01/07/2021 Negative Negative  07/11/2017 Negative Negative  11/18/2011 - NEGATIVE    Assessment: Collins presents for 3 month HIV PrEP follow up after starting Truvada in March 2022. She is taking Truvada daily with no missed doses or adverse effects. However, she does report some unilateral back pain that started over the last week. We are checking kidney function today but it may be musculoskeletal. She initially had issues filling Truvada from [facility] but once she switched pharmacies to [facility] (who mails it to her) she has had no issues. Her HIV+ husband is doing well on Biktarvy. She reports he is undetectable. No partners other than her husband.   Asks if she needs to use condoms to prevent transmission if her husband is undetectable. Explained U=U, that there is no risk of transmission if her husband is undetectable/taking his medications and she is taking PrEP, so she does not have to use condoms but encouraged her to do what she is comfortable with.  Hepatitis B surface antibody is non-reactive, indicating no immunity. She needs hepatitis B vaccine series. Will check on coverage for  vaccines for uninsured patients and follow up at next visit.    Plan: -HIV antibody, BMET -Truvada x 3 months if HIV negative -F/u visit in 3 months, will follow up on hepatitis B vaccine coverage  Pervis Hocking, PharmD PGY1 Pharmacy Resident 04/08/2021 10:43 AM

## 2021-04-09 ENCOUNTER — Telehealth: Payer: Self-pay | Admitting: Student-PharmD

## 2021-04-09 ENCOUNTER — Other Ambulatory Visit (HOSPITAL_COMMUNITY): Payer: Self-pay

## 2021-04-09 LAB — BASIC METABOLIC PANEL
BUN: 10 mg/dL (ref 7–25)
CO2: 29 mmol/L (ref 20–32)
Calcium: 8.9 mg/dL (ref 8.6–10.2)
Chloride: 103 mmol/L (ref 98–110)
Creat: 0.6 mg/dL (ref 0.50–1.10)
Glucose, Bld: 278 mg/dL — ABNORMAL HIGH (ref 65–99)
Potassium: 3.8 mmol/L (ref 3.5–5.3)
Sodium: 139 mmol/L (ref 135–146)

## 2021-04-09 LAB — HIV ANTIBODY (ROUTINE TESTING W REFLEX): HIV 1&2 Ab, 4th Generation: NONREACTIVE

## 2021-04-09 MED ORDER — EMTRICITABINE-TENOFOVIR DF 200-300 MG PO TABS
1.0000 | ORAL_TABLET | Freq: Every day | ORAL | 2 refills | Status: DC
  Filled 2021-04-09 – 2021-04-10 (×2): qty 30, 30d supply, fill #0
  Filled 2021-05-08: qty 30, 30d supply, fill #1
  Filled 2021-06-06: qty 30, 30d supply, fill #2

## 2021-04-09 NOTE — Telephone Encounter (Signed)
Called patient to let her know that her HIV antibody test from 04/08/21 is negative and refills for Truvada for three months have been sent to [facility].   Also informed her that her kidney function looks good, but her blood glucose is very elevated. She states she is followed by per PCP for diabetes management and she was recently started on a new medication. Asks what her blood glucose should be and reviewed fasting and post-prandial goals with her. She thanked me for the information and lab results.   Pervis Hocking, PharmD PGY1 Pharmacy Resident 04/09/2021 11:47 AM

## 2021-04-10 ENCOUNTER — Other Ambulatory Visit (HOSPITAL_COMMUNITY): Payer: Self-pay

## 2021-05-06 ENCOUNTER — Other Ambulatory Visit (HOSPITAL_COMMUNITY): Payer: Self-pay

## 2021-05-08 ENCOUNTER — Other Ambulatory Visit (HOSPITAL_COMMUNITY): Payer: Self-pay

## 2021-06-04 ENCOUNTER — Other Ambulatory Visit (HOSPITAL_COMMUNITY): Payer: Self-pay

## 2021-06-06 ENCOUNTER — Other Ambulatory Visit (HOSPITAL_COMMUNITY): Payer: Self-pay

## 2021-07-03 ENCOUNTER — Other Ambulatory Visit (HOSPITAL_COMMUNITY): Payer: Self-pay

## 2021-07-09 ENCOUNTER — Other Ambulatory Visit: Payer: Self-pay

## 2021-07-09 ENCOUNTER — Ambulatory Visit (INDEPENDENT_AMBULATORY_CARE_PROVIDER_SITE_OTHER): Payer: Self-pay | Admitting: Pharmacist

## 2021-07-09 ENCOUNTER — Other Ambulatory Visit (HOSPITAL_COMMUNITY): Payer: Self-pay

## 2021-07-09 DIAGNOSIS — Z7251 High risk heterosexual behavior: Secondary | ICD-10-CM

## 2021-07-09 DIAGNOSIS — Z23 Encounter for immunization: Secondary | ICD-10-CM

## 2021-07-09 NOTE — Progress Notes (Signed)
Date:  07/09/2021   HPI: Kylie Baker is a 45 y.o. female who presents to the [facility] pharmacy clinic to discuss and initiate PrEP.  Insured   []    Uninsured  [x]    Patient Active Problem List   Diagnosis Date Noted   High risk heterosexual behavior 01/07/2021   Vitamin B 12 deficiency 04/28/2018   Iron deficiency anemia 04/28/2018   Anemia 04/28/2018   Left elbow pain 11/04/2017   Syncope 07/20/2017   Symptomatic anemia 07/20/2017   Incomplete abortion with delayed or excessive hemorrhage 07/20/2017   Type 2 diabetes mellitus affecting pregnancy in first trimester, antepartum 07/11/2017   Goiter, nodular 07/04/2015   Vitamin D deficiency 03/27/2015   Unspecified essential hypertension 04/13/2014   Hypercalcemia 02/09/2014   Subclinical hyperthyroidism 02/09/2014   Incarcerated incisional hernia-s/p repair 09/07/12 10/05/2012   Supervision of high-risk pregnancy 01/25/2012   Gestational diabetes 01/25/2012   Chronic hypertension complicating or reason for care during pregnancy 01/25/2012   Asthma 01/25/2012    Patient's Medications  New Prescriptions   No medications on file  Previous Medications   AMLODIPINE (NORVASC) 10 MG TABLET    amlodipine 10 mg tablet  Take 1 tablet every day by oral route.   ATENOLOL (TENORMIN) 25 MG TABLET    Take 1 tablet by mouth daily.   ATORVASTATIN (LIPITOR) 40 MG TABLET    atorvastatin 40 mg tablet   B COMPLEX VITAMINS (B-COMPLEX/B-12 PO)    Take by mouth.   CHOLECALCIFEROL (VITAMIN D) 1000 UNITS TABLET    Take 1,000 Units by mouth daily.   EMTRICITABINE-TENOFOVIR (TRUVADA) 200-300 MG TABLET    Take 1 tablet by mouth daily.   FERROUS SULFATE 325 (65 FE) MG TABLET    Take 1 tablet (325 mg total) by mouth daily.   GLIPIZIDE (GLUCOTROL) 10 MG TABLET    glipizide 10 mg tablet   INSULIN NPH HUMAN (NOVOLIN N) 100 UNIT/ML INJECTION    Novolin N NPH U-100 Insulin isophane 100 unit/mL subcutaneous susp  INJECT 10 UNITS SUBCUTANEOUSLY ONCE DAILY AT  BEDTIME   LEVOTHYROXINE (SYNTHROID, LEVOTHROID) 200 MCG TABLET    levothyroxine 200 mcg tablet  Take 1 tablet every day by oral route for 30 days.   LISINOPRIL (PRINIVIL,ZESTRIL) 20 MG TABLET    lisinopril 20 mg tablet  TAKE 1 TABLET BY MOUTH DAILY   METFORMIN (GLUCOPHAGE) 1000 MG TABLET    Take 1,000 mg by mouth 2 (two) times daily with a meal.   NORGESTIMATE-ETHINYL ESTRADIOL TRIPHASIC 0.18/0.215/0.25 MG-35 MCG TABLET    Take 1 tablet by mouth daily.   OMEGA-3 FATTY ACIDS (FISH OIL) 1000 MG CAPS    Take 1 capsule by mouth daily.    VITAMIN C (ASCORBIC ACID) 500 MG TABLET    Take 500 mg by mouth daily.  Modified Medications   No medications on file  Discontinued Medications   No medications on file    Allergies: Allergies  Allergen Reactions   Aspirin Itching and Rash    Past Medical History: Past Medical History:  Diagnosis Date   Asthma    Diabetes (HCC) 07/11/2017   Gestational diabetes 01/25/2012   Hypertension    Palpitations    Pregnancy induced hypertension    Thyroid disease     Social History: Social History   Socioeconomic History   Marital status: Married    Spouse name: Not on file   Number of children: Not on file   Years of education: Not on file  Highest education level: Not on file  Occupational History   Not on file  Tobacco Use   Smoking status: Never   Smokeless tobacco: Never  Vaping Use   Vaping Use: Never used  Substance and Sexual Activity   Alcohol use: No   Drug use: No   Sexual activity: Yes    Birth control/protection: None  Other Topics Concern   Not on file  Social History Narrative   Lives in [place] with spouse and children.  Unemployed.   Social Determinants of Health   Financial Resource Strain: Not on file  Food Insecurity: Not on file  Transportation Needs: Not on file  Physical Activity: Not on file  Stress: Not on file  Social Connections: Not on file    No flowsheet data found.  Labs:  SCr: Lab Results   Component Value Date   CREATININE 0.60 04/08/2021   CREATININE 0.51 01/07/2021   CREATININE 0.73 08/29/2019   CREATININE 0.71 12/12/2018   CREATININE 0.67 03/24/2018   HIV Lab Results  Component Value Date   HIV NON-REACTIVE 04/08/2021   HIV NON-REACTIVE 01/07/2021   HIV Non Reactive 07/11/2017   HIV Non-reactive 11/10/2011   Hepatitis B Lab Results  Component Value Date   HEPBSAB NON-REACTIVE 01/07/2021   HEPBSAG NON-REACTIVE 01/07/2021   Hepatitis C Lab Results  Component Value Date   HEPCAB NON-REACTIVE 01/07/2021   Hepatitis A No results found for: HAV RPR and STI Lab Results  Component Value Date   LABRPR NON-REACTIVE 01/07/2021   LABRPR NON REACTIVE 03/26/2012   LABRPR Nonreactive 11/10/2011   LABRPR NON REACTIVE 06/06/2010    STI Results GC CT  01/07/2021 Negative Negative  07/11/2017 Negative Negative  11/18/2011 - NEGATIVE    Assessment:  Patient presented today for PrEP follow-up. Patient has no complaints with Truvada, but states she only has ~6 pills left. I told her we would be able to send her prescription to [facility] after she has a negative HIV Ab test, which should just take a day or 2. Patient was slightly confused about why she was taking PrEP and I explained to her that since her husband was HIV +, PrEP will offer her additional protection. Further explained to her the undetectable=untransmittable (U=U) principle, but emphasized there is still the potential for "blips" which could increase her risk of acquiring HIV, which Truvada will hopefully prevent.   Influenza vaccine and hepatitis B vaccine were also administered in clinic. Patient inquired about whether she should get the Hepatitis B vaccine and I informed her that some people are at greater risk of acquiring Hepatitis B based on occupation, such as healthcare providers and certain behaviors, such as PWID. Further explained how HepB is highly virulent and can cause severe damage to her liver in the  event of infection and she consented to receive the vaccine. Patient was informed of the cost (~$170/dose) and informed her she would have to cover the cost of the vaccine as it does not fall under the PrEP fund, to which she voiced understanding.  Plan: - Influenza vaccine IM x1 - Heplisav #1/2 (F/U for second vaccine 08/08/21) - Continue Truvada (pending negative HIV Ab)  - F/U HIV Ab  - F/U with pharmacy PrEP clinic (10/08/21)   Jani Gravel, PharmD PGY-1 Acute Care Resident  07/09/2021 8:58 AM

## 2021-07-10 ENCOUNTER — Other Ambulatory Visit: Payer: Self-pay | Admitting: Pharmacist

## 2021-07-10 ENCOUNTER — Other Ambulatory Visit (HOSPITAL_COMMUNITY): Payer: Self-pay

## 2021-07-10 DIAGNOSIS — Z7251 High risk heterosexual behavior: Secondary | ICD-10-CM

## 2021-07-10 LAB — HIV ANTIBODY (ROUTINE TESTING W REFLEX): HIV 1&2 Ab, 4th Generation: NONREACTIVE

## 2021-07-10 MED ORDER — EMTRICITABINE-TENOFOVIR DF 200-300 MG PO TABS
1.0000 | ORAL_TABLET | Freq: Every day | ORAL | 2 refills | Status: DC
Start: 2021-07-10 — End: 2021-10-09
  Filled 2021-07-10: qty 30, 30d supply, fill #0
  Filled 2021-08-05: qty 30, 30d supply, fill #1
  Filled 2021-08-27: qty 30, 30d supply, fill #2

## 2021-08-05 ENCOUNTER — Other Ambulatory Visit (HOSPITAL_COMMUNITY): Payer: Self-pay

## 2021-08-08 ENCOUNTER — Other Ambulatory Visit (HOSPITAL_COMMUNITY): Payer: Self-pay

## 2021-08-08 ENCOUNTER — Other Ambulatory Visit: Payer: Self-pay

## 2021-08-08 ENCOUNTER — Ambulatory Visit (INDEPENDENT_AMBULATORY_CARE_PROVIDER_SITE_OTHER): Payer: Self-pay | Admitting: Pharmacist

## 2021-08-08 DIAGNOSIS — Z23 Encounter for immunization: Secondary | ICD-10-CM

## 2021-08-08 NOTE — Progress Notes (Signed)
   [facility] Vaccination Visit  HPI: Kylie Baker is a 45 y.o. female who presents to the [facility] pharmacy clinic for vaccine administration.  Hepatitis B Lab Results  Component Value Date   HEPBSAB NON-REACTIVE 01/07/2021   Lab Results  Component Value Date   HEPBSAG NON-REACTIVE 01/07/2021    Hepatitis C No results found for: HCVAB  Hepatitis A No results found for: HAV  Assessment & Plan: - Administered Heplisav vaccine #2/2 - Patient tolerated well - Next appt on 12/20 for HIV PrEP follow up  Rada Zegers L. Josslynn Mentzer, PharmD, BCIDP, AAHIVP, CPP Clinical Pharmacist Practitioner Infectious Diseases Clinical Pharmacist [facility] 08/08/2021, 9:09 AM

## 2021-08-27 ENCOUNTER — Other Ambulatory Visit (HOSPITAL_COMMUNITY): Payer: Self-pay

## 2021-09-05 ENCOUNTER — Other Ambulatory Visit (HOSPITAL_COMMUNITY): Payer: Self-pay

## 2021-09-27 ENCOUNTER — Other Ambulatory Visit (HOSPITAL_COMMUNITY): Payer: Self-pay

## 2021-10-08 ENCOUNTER — Other Ambulatory Visit: Payer: Self-pay

## 2021-10-08 ENCOUNTER — Ambulatory Visit (INDEPENDENT_AMBULATORY_CARE_PROVIDER_SITE_OTHER): Payer: Self-pay | Admitting: Pharmacist

## 2021-10-08 DIAGNOSIS — Z79899 Other long term (current) drug therapy: Secondary | ICD-10-CM

## 2021-10-08 LAB — HIV ANTIBODY (ROUTINE TESTING W REFLEX): HIV 1&2 Ab, 4th Generation: NONREACTIVE

## 2021-10-08 NOTE — Progress Notes (Signed)
Date:  10/08/2021   HPI: Kylie Baker is a 45 y.o. female who presents to the [facility] clinic for HIV PrEP follow-up.  Insured   []    Uninsured  []    Patient Active Problem List   Diagnosis Date Noted   High risk heterosexual behavior 01/07/2021   Vitamin B 12 deficiency 04/28/2018   Iron deficiency anemia 04/28/2018   Anemia 04/28/2018   Left elbow pain 11/04/2017   Syncope 07/20/2017   Symptomatic anemia 07/20/2017   Incomplete abortion with delayed or excessive hemorrhage 07/20/2017   Type 2 diabetes mellitus affecting pregnancy in first trimester, antepartum 07/11/2017   Goiter, nodular 07/04/2015   Vitamin D deficiency 03/27/2015   Unspecified essential hypertension 04/13/2014   Hypercalcemia 02/09/2014   Subclinical hyperthyroidism 02/09/2014   Incarcerated incisional hernia-s/p repair 09/07/12 10/05/2012   Supervision of high-risk pregnancy 01/25/2012   Gestational diabetes 01/25/2012   Chronic hypertension complicating or reason for care during pregnancy 01/25/2012   Asthma 01/25/2012    Patient's Medications  New Prescriptions   No medications on file  Previous Medications   AMLODIPINE (NORVASC) 10 MG TABLET    amlodipine 10 mg tablet  Take 1 tablet every day by oral route.   ATENOLOL (TENORMIN) 25 MG TABLET    Take 1 tablet by mouth daily.   ATORVASTATIN (LIPITOR) 40 MG TABLET    atorvastatin 40 mg tablet   B COMPLEX VITAMINS (B-COMPLEX/B-12 PO)    Take by mouth.   CHOLECALCIFEROL (VITAMIN D) 1000 UNITS TABLET    Take 1,000 Units by mouth daily.   EMTRICITABINE-TENOFOVIR (TRUVADA) 200-300 MG TABLET    Take 1 tablet by mouth daily.   FERROUS SULFATE 325 (65 FE) MG TABLET    Take 1 tablet (325 mg total) by mouth daily.   GLIPIZIDE (GLUCOTROL) 10 MG TABLET    glipizide 10 mg tablet   INSULIN NPH HUMAN (NOVOLIN N) 100 UNIT/ML INJECTION    Novolin N NPH U-100 Insulin isophane 100 unit/mL subcutaneous susp  INJECT 10 UNITS SUBCUTANEOUSLY ONCE DAILY AT  BEDTIME   LEVOTHYROXINE (SYNTHROID, LEVOTHROID) 200 MCG TABLET    levothyroxine 200 mcg tablet  Take 1 tablet every day by oral route for 30 days.   LISINOPRIL (PRINIVIL,ZESTRIL) 20 MG TABLET    lisinopril 20 mg tablet  TAKE 1 TABLET BY MOUTH DAILY   METFORMIN (GLUCOPHAGE) 1000 MG TABLET    Take 1,000 mg by mouth 2 (two) times daily with a meal.   NORGESTIMATE-ETHINYL ESTRADIOL TRIPHASIC 0.18/0.215/0.25 MG-35 MCG TABLET    Take 1 tablet by mouth daily.   OMEGA-3 FATTY ACIDS (FISH OIL) 1000 MG CAPS    Take 1 capsule by mouth daily.    VITAMIN C (ASCORBIC ACID) 500 MG TABLET    Take 500 mg by mouth daily.  Modified Medications   No medications on file  Discontinued Medications   No medications on file    Allergies: Allergies  Allergen Reactions   Aspirin Itching and Rash    Past Medical History: Past Medical History:  Diagnosis Date   Asthma    Diabetes ([place]) 07/11/2017   Gestational diabetes 01/25/2012   Hypertension    Palpitations    Pregnancy induced hypertension    Thyroid disease     Social History: Social History   Socioeconomic History   Marital status: Married    Spouse name: Not on file   Number of children: Not on file   Years of education: Not on file   Highest  education level: Not on file  Occupational History   Not on file  Tobacco Use   Smoking status: Never   Smokeless tobacco: Never  Vaping Use   Vaping Use: Never used  Substance and Sexual Activity   Alcohol use: No   Drug use: No   Sexual activity: Yes    Birth control/protection: None  Other Topics Concern   Not on file  Social History Narrative   Lives in [place] with spouse and children.  Unemployed.   Social Determinants of Health   Financial Resource Strain: Not on file  Food Insecurity: Not on file  Transportation Needs: Not on file  Physical Activity: Not on file  Stress: Not on file  Social Connections: Not on file    No flowsheet data found.  Labs:  SCr: Lab Results   Component Value Date   CREATININE 0.60 04/08/2021   CREATININE 0.51 01/07/2021   CREATININE 0.73 08/29/2019   CREATININE 0.71 12/12/2018   CREATININE 0.67 03/24/2018   HIV Lab Results  Component Value Date   HIV NON-REACTIVE 07/09/2021   HIV NON-REACTIVE 04/08/2021   HIV NON-REACTIVE 01/07/2021   HIV Non Reactive 07/11/2017   HIV Non-reactive 11/10/2011   Hepatitis B Lab Results  Component Value Date   HEPBSAB NON-REACTIVE 01/07/2021   HEPBSAG NON-REACTIVE 01/07/2021   Hepatitis C Lab Results  Component Value Date   HEPCAB NON-REACTIVE 01/07/2021   Hepatitis A No results found for: HAV RPR and STI Lab Results  Component Value Date   LABRPR NON-REACTIVE 01/07/2021   LABRPR NON REACTIVE 03/26/2012   LABRPR Nonreactive 11/10/2011   LABRPR NON REACTIVE 06/06/2010    STI Results GC CT  01/07/2021 Negative Negative  07/11/2017 Negative Negative  11/18/2011 - NEGATIVE    Assessment: Desarie comes in today to follow up for HIV  PrEP. Her husband is HIV positive, on medication and doing well. She takes Truvada every day with no side effects or issues with tolerability. She does not miss doses and has about 5 tablets remaining. No issues today and no new medications since she was last seen. Kidney function checked back in June and stable. Will check a HIV antibody today and push out her appointment to every 6 months since she is only sexually active with her husband and he is controlled. She is in agreement with the plan and has no questions today.   Plan: - HIV antibody today - Truvada x 6 months if HIV negative - F/u with me in 6 months (June 2023)  Noeh Sparacino L. Sarahbeth Cashin, PharmD, BCIDP, AAHIVP, CPP Clinical Pharmacist Practitioner Infectious Diseases Clinical Pharmacist [facility] 10/08/2021, 9:05 AM

## 2021-10-09 ENCOUNTER — Other Ambulatory Visit (HOSPITAL_COMMUNITY): Payer: Self-pay

## 2021-10-09 ENCOUNTER — Other Ambulatory Visit: Payer: Self-pay | Admitting: Pharmacist

## 2021-10-09 DIAGNOSIS — Z7251 High risk heterosexual behavior: Secondary | ICD-10-CM

## 2021-10-09 MED ORDER — EMTRICITABINE-TENOFOVIR DF 200-300 MG PO TABS
1.0000 | ORAL_TABLET | Freq: Every day | ORAL | 2 refills | Status: DC
  Filled 2021-10-09 (×2): qty 30, 30d supply, fill #0
  Filled 2021-11-04: qty 30, 30d supply, fill #1
  Filled 2021-12-03: qty 30, 30d supply, fill #2

## 2021-10-18 ENCOUNTER — Other Ambulatory Visit (HOSPITAL_COMMUNITY): Payer: Self-pay

## 2021-11-01 ENCOUNTER — Other Ambulatory Visit (HOSPITAL_COMMUNITY): Payer: Self-pay

## 2021-11-04 ENCOUNTER — Other Ambulatory Visit (HOSPITAL_COMMUNITY): Payer: Self-pay

## 2021-11-05 ENCOUNTER — Encounter: Payer: Self-pay | Admitting: Gastroenterology

## 2021-11-07 ENCOUNTER — Other Ambulatory Visit (HOSPITAL_COMMUNITY): Payer: Self-pay

## 2021-11-20 ENCOUNTER — Encounter: Payer: Self-pay | Admitting: Gastroenterology

## 2021-11-20 ENCOUNTER — Ambulatory Visit (INDEPENDENT_AMBULATORY_CARE_PROVIDER_SITE_OTHER): Payer: Self-pay | Admitting: Gastroenterology

## 2021-11-20 VITALS — BP 130/90 | HR 89 | Ht 62.0 in | Wt 225.0 lb

## 2021-11-20 DIAGNOSIS — R195 Other fecal abnormalities: Secondary | ICD-10-CM

## 2021-11-20 NOTE — Progress Notes (Signed)
[facility] Consult Note:  History: Kylie Baker 11/20/2021  Referring provider: [name], PA-C  Reason for consult/chief complaint: Blood In Stools (Pt was heme positive at pcp's office; she also reports a hx of hemorrhoids that sometimes protrude and bleed)   Subjective  HPI:  Kylie Baker was referred by primary care for heme positive stool, and office notes indicate it was discovered on rectal exam at a routine visit.  Patient has described intermittent painless rectal bleeding she believes to be hemorrhoidal in nature, about every few months and usually if she strains for bowel movement or to pass gas.  She denies chronic abdominal pain, usually has regular BMs without difficulty, denies dysphagia, nausea vomiting or early satiety.   ROS:  Review of Systems  Constitutional:  Negative for appetite change and unexpected weight change.  HENT:  Negative for mouth sores and voice change.   Eyes:  Negative for pain and redness.  Respiratory:  Negative for cough and shortness of breath.   Cardiovascular:  Negative for chest pain and palpitations.  Genitourinary:  Negative for dysuria and hematuria.  Musculoskeletal:  Negative for arthralgias and myalgias.  Skin:  Negative for pallor and rash.  Neurological:  Negative for weakness and headaches.  Hematological:  Negative for adenopathy.    Past Medical History: Past Medical History:  Diagnosis Date   Asthma    Diabetes (HCC) 07/11/2017   Gestational diabetes 01/25/2012   Hypertension    Palpitations    Pregnancy induced hypertension    Thyroid disease      Past Surgical History: Past Surgical History:  Procedure Laterality Date   APPENDECTOMY  1980's   CESAREAN SECTION  06/06/2010   CESAREAN SECTION  03/27/2012   Procedure: CESAREAN SECTION;  Surgeon: Kathreen Cosier, MD;  Location: [facility];  Service: Gynecology;  Laterality: N/A;  Repeat Cesarean Section Delivery   Boy  @ 0005 , Apgars 8/9    CHOLECYSTECTOMY  04/22/2012   Procedure: LAPAROSCOPIC CHOLECYSTECTOMY WITH INTRAOPERATIVE CHOLANGIOGRAM;  Surgeon: Mariella Saa, MD;  Location: [facility];  Service: General;  Laterality: N/A;   DILATION AND EVACUATION N/A 07/24/2017   Procedure: DILATATION AND EVACUATION;  Surgeon: Tilda Burrow, MD;  Location: [facility];  Service: Gynecology;  Laterality: N/A;   THYROIDECTOMY     UMBILICAL HERNIA REPAIR  09/07/2012   Procedure: HERNIA REPAIR UMBILICAL ADULT;  Surgeon: Adolph Pollack, MD;  Location: [facility];  Service: General;  Laterality: N/A;  Repair of Incisional Incarcarated hernia     Family History: Family History  Problem Relation Age of Onset   Hypertension Mother    Diabetes Father    Asthma Father    Hypertension Father    Anesthesia problems Neg Hx     Social History: Social History   Socioeconomic History   Marital status: Married    Spouse name: Not on file   Number of children: Not on file   Years of education: Not on file   Highest education level: Not on file  Occupational History   Not on file  Tobacco Use   Smoking status: Never   Smokeless tobacco: Never  Vaping Use   Vaping Use: Never used  Substance and Sexual Activity   Alcohol use: No   Drug use: No   Sexual activity: Yes    Birth control/protection: None  Other Topics Concern   Not on file  Social History Narrative   Lives in [place] with spouse and children.  Unemployed.  Social Determinants of Health   Financial Resource Strain: Not on file  Food Insecurity: Not on file  Transportation Needs: Not on file  Physical Activity: Not on file  Stress: Not on file  Social Connections: Not on file   Originally from [place]  Allergies: Allergies  Allergen Reactions   Aspirin Itching and Rash    Outpatient Meds: Current Outpatient Medications  Medication Sig Dispense Refill   amLODipine (NORVASC) 10 MG tablet amlodipine 10 mg tablet  Take 1 tablet every day by oral route.      atenolol (TENORMIN) 25 MG tablet Take 1 tablet by mouth daily.     atorvastatin (LIPITOR) 40 MG tablet atorvastatin 40 mg tablet     B Complex Vitamins (B-COMPLEX/B-12 PO) Take by mouth.     cholecalciferol (VITAMIN D) 1000 units tablet Take 1,000 Units by mouth daily.     emtricitabine-tenofovir (TRUVADA) 200-300 MG tablet Take 1 tablet by mouth daily. 30 tablet 2   ferrous sulfate 325 (65 FE) MG tablet Take 1 tablet (325 mg total) by mouth daily. (Patient taking differently: Take 325 mg by mouth 3 (three) times daily.) 30 tablet 0   glipiZIDE (GLUCOTROL) 10 MG tablet glipizide 10 mg tablet     insulin NPH Human (NOVOLIN N) 100 UNIT/ML injection Novolin N NPH U-100 Insulin isophane 100 unit/mL subcutaneous susp  INJECT 10 UNITS SUBCUTANEOUSLY ONCE DAILY AT BEDTIME     levothyroxine (SYNTHROID, LEVOTHROID) 200 MCG tablet levothyroxine 200 mcg tablet  Take 1 tablet every day by oral route for 30 days.     lisinopril (PRINIVIL,ZESTRIL) 20 MG tablet lisinopril 20 mg tablet  TAKE 1 TABLET BY MOUTH DAILY     metFORMIN (GLUCOPHAGE) 1000 MG tablet Take 1,000 mg by mouth 2 (two) times daily with a meal.     Norgestimate-Ethinyl Estradiol Triphasic 0.18/0.215/0.25 MG-35 MCG tablet Take 1 tablet by mouth daily.     Omega-3 Fatty Acids (FISH OIL) 1000 MG CAPS Take 1 capsule by mouth daily.      vitamin C (ASCORBIC ACID) 500 MG tablet Take 500 mg by mouth daily.     No current facility-administered medications for this visit.      ___________________________________________________________________ Objective   Exam:  BP 130/90    Pulse 89    Ht 5\' 2"  (1.575 m)    Wt 225 lb (102.1 kg)    BMI 41.15 kg/m  Wt Readings from Last 3 Encounters:  11/20/21 225 lb (102.1 kg)  01/07/21 243 lb (110.2 kg)  08/29/19 233 lb 3.2 oz (105.8 kg)    General: Well-appearing Eyes: sclera anicteric, no redness ENT: oral mucosa moist without lesions, no cervical or supraclavicular lymphadenopathy CV: RRR without  murmur, S1/S2, no JVD, no peripheral edema Resp: clear to auscultation bilaterally, normal RR and effort noted GI: soft, obese, no tenderness, with active bowel sounds. No guarding or palpable organomegaly noted. Skin; warm and dry, no rash or jaundice noted Neuro: awake, alert and oriented x 3. Normal gross motor function and fluent speech Rectal: Normal perianal exam.  Normal sphincter tone, no tenderness, fissure or palpable internal lesion.  Soft brown stool rectal vault Labs:  CBC Latest Ref Rng & Units 08/29/2019 12/12/2018 04/28/2018  WBC 4.0 - 10.5 K/uL 6.8 6.0 6.3  Hemoglobin 12.0 - 15.0 g/dL 12.6 13.6 11.9  Hematocrit 36.0 - 46.0 % 42.5 48.1(H) 38.8  Platelets 150 - 400 K/uL 197 179 175   Iron/TIBC/Ferritin/ %Sat    Component Value Date/Time   IRON  100 03/24/2018 1235   TIBC 321 03/24/2018 1235   FERRITIN 25 03/24/2018 1235   IRONPCTSAT 31 03/24/2018 1235    No lab work accompanying the primary care referral notes.  Assessment: Encounter Diagnosis  Name Primary?   Heme positive stool Yes    Reportedly found on primary care routine exam.  Patient has what sounds like intermittent hemorrhoidal bleeding.  Colonoscopy recommended.  She is currently uninsured, and was scheduled for the procedure given the required financial disclosures.  The benefits and risks of the planned procedure were described in detail with the patient or (when appropriate) their health care proxy.  Risks were outlined as including, but not limited to, bleeding, infection, perforation, adverse medication reaction leading to cardiac or pulmonary decompensation, pancreatitis (if ERCP).  The limitation of incomplete mucosal visualization was also discussed.  No guarantees or warranties were given.   Thank you for the courtesy of this consult.  Please call me with any questions or concerns.  Nelida Meuse III  CC: Referring provider noted above

## 2021-11-20 NOTE — Patient Instructions (Signed)
If you are age 47 or older, your body mass index should be between 23-30. Your Body mass index is 41.15 kg/m. If this is out of the aforementioned range listed, please consider follow up with your Primary Care Provider.  If you are age 20 or younger, your body mass index should be between 19-25. Your Body mass index is 41.15 kg/m. If this is out of the aformentioned range listed, please consider follow up with your Primary Care Provider.   ________________________________________________________  The [facility] providers would like to encourage you to use [facility] to communicate with providers for non-urgent requests or questions.  Due to long hold times on the telephone, sending your provider a message by [facility] may be a faster and more efficient way to get a response.  Please allow 48 business hours for a response.  Please remember that this is for non-urgent requests.  _______________________________________________________  It has been recommended to you by your physician that you have a(n) Colonoscopy completed. Per your request, we did not schedule the procedure(s) today. Please contact our office at [phone] should you decide to have the procedure completed. You will be scheduled for a pre-visit and procedure at that time.   It was a pleasure to see you today!  Thank you for trusting me with your gastrointestinal care!

## 2021-12-03 ENCOUNTER — Other Ambulatory Visit (HOSPITAL_COMMUNITY): Payer: Self-pay

## 2021-12-04 ENCOUNTER — Other Ambulatory Visit (HOSPITAL_COMMUNITY): Payer: Self-pay

## 2021-12-10 ENCOUNTER — Other Ambulatory Visit (HOSPITAL_COMMUNITY): Payer: Self-pay

## 2021-12-31 ENCOUNTER — Other Ambulatory Visit (HOSPITAL_COMMUNITY): Payer: Self-pay

## 2021-12-31 ENCOUNTER — Other Ambulatory Visit: Payer: Self-pay | Admitting: Pharmacist

## 2021-12-31 DIAGNOSIS — Z7251 High risk heterosexual behavior: Secondary | ICD-10-CM

## 2021-12-31 MED ORDER — EMTRICITABINE-TENOFOVIR DF 200-300 MG PO TABS
1.0000 | ORAL_TABLET | Freq: Every day | ORAL | 2 refills | Status: DC
  Filled 2021-12-31: qty 30, 30d supply, fill #0
  Filled 2022-02-03 – 2022-02-06 (×2): qty 30, 30d supply, fill #1
  Filled 2022-02-24: qty 30, 30d supply, fill #2

## 2022-01-06 ENCOUNTER — Other Ambulatory Visit (HOSPITAL_COMMUNITY): Payer: Self-pay

## 2022-01-09 ENCOUNTER — Telehealth: Payer: Self-pay | Admitting: Gastroenterology

## 2022-01-09 ENCOUNTER — Encounter: Payer: Self-pay | Admitting: Gastroenterology

## 2022-01-09 NOTE — Telephone Encounter (Signed)
[place], ? ?Please call this patient next week to reschedule.  It is a diagnostic colonoscopy for Heme positive stool. ? ?- HD ?

## 2022-01-09 NOTE — Telephone Encounter (Signed)
Called patient to see if she was coming in for her procedure. Pt states she was told she had to pay up front. Advised pt we do not take money that we would bill her for everything. Patient will call back to R/S.  ?

## 2022-01-10 NOTE — Telephone Encounter (Signed)
Contacted patient to find out if she had been given a prep in the office due to being self pay. She had advised me that she had not. I let her know that I would set a prep at the front desk for her to pick up along with instruction.  ?

## 2022-01-10 NOTE — Telephone Encounter (Signed)
Rescheduled patient's colonoscopy for 03/06/22 at 8:30 a.m.  Please send prep instructions to reflect date & time.  Thank you. ?

## 2022-01-10 NOTE — Telephone Encounter (Signed)
Noted, thank you

## 2022-01-30 ENCOUNTER — Other Ambulatory Visit (HOSPITAL_COMMUNITY): Payer: Self-pay

## 2022-02-03 ENCOUNTER — Other Ambulatory Visit (HOSPITAL_COMMUNITY): Payer: Self-pay

## 2022-02-04 ENCOUNTER — Other Ambulatory Visit (HOSPITAL_COMMUNITY): Payer: Self-pay

## 2022-02-06 ENCOUNTER — Telehealth: Payer: Self-pay

## 2022-02-06 ENCOUNTER — Other Ambulatory Visit (HOSPITAL_COMMUNITY): Payer: Self-pay

## 2022-02-06 NOTE — Telephone Encounter (Signed)
[facility] Patient Advocate Encounter ? ?Completed and sent [employer] Advancing Access application for Truvada for this patient who is uninsured.   ? ?Patient is approved 02/05/22 through 02/05/23. ? ?BIN      [id number] ?PCN    [id number] ?GRP    [id number] ?ID        [id number] ? ? ?Clearance Coots, CPhT ?Specialty Pharmacy Patient Advocate ?[facility] ?Phone: [phone] ?Fax:  [phone]  ?

## 2022-02-19 ENCOUNTER — Encounter: Payer: Self-pay | Admitting: Internal Medicine

## 2022-02-19 ENCOUNTER — Ambulatory Visit (INDEPENDENT_AMBULATORY_CARE_PROVIDER_SITE_OTHER): Payer: Self-pay | Admitting: Internal Medicine

## 2022-02-19 VITALS — BP 130/74 | HR 72 | Ht 62.0 in | Wt 220.0 lb

## 2022-02-19 DIAGNOSIS — R739 Hyperglycemia, unspecified: Secondary | ICD-10-CM

## 2022-02-19 DIAGNOSIS — E1165 Type 2 diabetes mellitus with hyperglycemia: Secondary | ICD-10-CM

## 2022-02-19 DIAGNOSIS — Z794 Long term (current) use of insulin: Secondary | ICD-10-CM

## 2022-02-19 DIAGNOSIS — E890 Postprocedural hypothyroidism: Secondary | ICD-10-CM

## 2022-02-19 LAB — BASIC METABOLIC PANEL
BUN: 12 mg/dL (ref 6–23)
CO2: 27 mEq/L (ref 19–32)
Calcium: 8.8 mg/dL (ref 8.4–10.5)
Chloride: 100 mEq/L (ref 96–112)
Creatinine, Ser: 0.64 mg/dL (ref 0.40–1.20)
GFR: 105.59 mL/min (ref >60.00)
Glucose, Bld: 396 mg/dL — ABNORMAL HIGH (ref 70–99)
Potassium: 3.7 mEq/L (ref 3.5–5.1)
Sodium: 135 mEq/L (ref 135–145)

## 2022-02-19 LAB — POCT GLYCOSYLATED HEMOGLOBIN (HGB A1C): Hemoglobin A1C: 12.1 % — AB (ref 4.0–5.6)

## 2022-02-19 LAB — TSH: TSH: 7.73 u[IU]/mL — ABNORMAL HIGH (ref 0.35–5.50)

## 2022-02-19 LAB — POCT GLUCOSE (DEVICE FOR HOME USE): Glucose Fasting, POC: 390 mg/dL — AB (ref 70–99)

## 2022-02-19 MED ORDER — INSULIN NPH (HUMAN) (ISOPHANE) 100 UNIT/ML ~~LOC~~ SUSP: 12.0000 [IU] | Freq: Two times a day (BID) | SUBCUTANEOUS | 6 refills | Status: DC

## 2022-02-19 MED ORDER — METFORMIN HCL ER 750 MG PO TB24: 750.0000 mg | ORAL_TABLET | Freq: Every day | ORAL | 3 refills | Status: DC

## 2022-02-19 MED ORDER — "INSULIN SYRINGE 31G X 5/16"" 0.3 ML MISC": 1.0000 | Freq: Two times a day (BID) | 3 refills | Status: DC

## 2022-02-19 NOTE — Patient Instructions (Addendum)
-   Stop Metformin 1000 mg  ?- Start Metformin 750 mg XR , 1 tablet with Dinner ?- Continue Glipizide 10 mg , 1 tablet before Breakfast and Before Supper  ?- Take ReliOn-N ( NPH) 12 units TWICE daily  ? ? ? ? ?HOW TO TREAT LOW BLOOD SUGARS (Blood sugar LESS THAN 70 MG/DL) ?Please follow the RULE OF 15 for the treatment of hypoglycemia treatment (when your (blood sugars are less than 70 mg/dL)  ? ?STEP 1: Take 15 grams of carbohydrates when your blood sugar is low, which includes:  ?3-4 GLUCOSE TABS  OR ?3-4 OZ OF JUICE OR REGULAR SODA OR ?ONE TUBE OF GLUCOSE GEL   ? ?STEP 2: RECHECK blood sugar in 15 MINUTES ?STEP 3: If your blood sugar is still low at the 15 minute recheck --> then, go back to STEP 1 and treat AGAIN with another 15 grams of carbohydrates. ? ?

## 2022-02-19 NOTE — Progress Notes (Signed)
?Name: Kylie Baker  ?MRN/ DOB: [id number], 1976-06-26   ?Age/ Sex: 46 y.o., female   ? ?PCP: [name], PA-C   ?Reason for Endocrinology Evaluation: Type 2 Diabetes Mellitus/ Hypothyridism  ?   ?Date of Initial Endocrinology Visit: 02/19/2022   ? ? ?PATIENT IDENTIFIER: Kylie Baker is a 46 y.o. female with a past medical history of T2Dm, HTN, Dyslipidemia and Hypothyroidism. The patient presented for initial endocrinology clinic visit on 02/19/2022 for consultative assistance with her diabetes management.  ? ? ?HPI: ?Kylie Baker was  ? ? ?Diagnosed with DM years ago  ?Prior Medications tried/Intolerance: intolerant to BID dosing of metformin  ?Currently checking blood sugars occasionally  x / day ?Hypoglycemia episodes : no               ?Hemoglobin A1c has ranged from 11.6% in 02/2021, peaking at 12.1% in 2023 ?Patient required assistance for hypoglycemia: no ?Patient has required hospitalization within the last 1 year from hyper or hypoglycemia: no  ? ?In terms of diet, the patient eats 2 meals a day, avoids snacks and sugar-sweetened beverages  ? ?She takes Metformin once a day due to GI issues if she takes in the morning  ? ?She has not taken insulin in 5 days  ? ? ?THYROID HISTORY: ?She has been diagnosed with hypothyroidism for many years, she is S/P total thyroidectomy due to benign reasons.  She is on LT-4  replacement.  HerTSH has been fluctuating over the years. ? ? ? ? ? ?HOME ENDOCRINE REGIMEN: ?Metformin 1000 mg twice daily- takes once daily  ?Glipizide 10 mg BID  ?NPH 11 units at bedtime - has not taken in 5 days  ?Levothyroxine 200 mcg daily  ? ? ?Statin: Yes ?ACE-I/ARB: Yes ? ? ? ?METER DOWNLOAD SUMMARY: did not bring  ? ?DIABETIC COMPLICATIONS: ?Microvascular complications:  ? ?Denies: neuropathy, retinopathy,  ?Last eye exam: Completed  ? ?Macrovascular complications:  ? ?Denies: CAD, PVD, CVA ? ? ?PAST HISTORY: ?Past Medical History:  ?Past Medical History:  ?Diagnosis Date  ?  Asthma   ? Diabetes (HCC) 07/11/2017  ? Gestational diabetes 01/25/2012  ? Hypertension   ? Palpitations   ? Pregnancy induced hypertension   ? Thyroid disease   ? ?Past Surgical History:  ?Past Surgical History:  ?Procedure Laterality Date  ? APPENDECTOMY  1980's  ? CESAREAN SECTION  06/06/2010  ? CESAREAN SECTION  03/27/2012  ? Procedure: CESAREAN SECTION;  Surgeon: Kathreen Cosier, MD;  Location: [facility];  Service: Gynecology;  Laterality: N/A;  Repeat Cesarean Section Delivery   Boy  @ 0005 , Apgars 8/9  ? CHOLECYSTECTOMY  04/22/2012  ? Procedure: LAPAROSCOPIC CHOLECYSTECTOMY WITH INTRAOPERATIVE CHOLANGIOGRAM;  Surgeon: Mariella Saa, MD;  Location: [facility];  Service: General;  Laterality: N/A;  ? DILATION AND EVACUATION N/A 07/24/2017  ? Procedure: DILATATION AND EVACUATION;  Surgeon: Tilda Burrow, MD;  Location: [facility];  Service: Gynecology;  Laterality: N/A;  ? THYROIDECTOMY    ? UMBILICAL HERNIA REPAIR  09/07/2012  ? Procedure: HERNIA REPAIR UMBILICAL ADULT;  Surgeon: Adolph Pollack, MD;  Location: [facility];  Service: General;  Laterality: N/A;  Repair of Incisional Incarcarated hernia  ?  ?Social History:  reports that she has never smoked. She has never used smokeless tobacco. She reports that she does not drink alcohol and does not use drugs. ?Family History:  ?Family History  ?Problem Relation Age of Onset  ? Hypertension Mother   ? Diabetes Father   ?  Asthma Father   ? Hypertension Father   ? Anesthesia problems Neg Hx   ? ? ? ?HOME MEDICATIONS: ?Allergies as of 02/19/2022   ? ?   Reactions  ? Aspirin Itching, Rash  ? ?  ? ?  ?Medication List  ?  ? ?  ? Accurate as of Feb 19, 2022  8:42 AM. If you have any questions, ask your nurse or doctor.  ?  ?  ? ?  ? ?STOP taking these medications   ? ?B-COMPLEX/B-12 PO ?Stopped by: Scarlette Shorts, MD ?  ?metFORMIN 1000 MG tablet ?Commonly known as: GLUCOPHAGE ?Replaced by: metFORMIN 750 MG 24 hr tablet ?Stopped by: Scarlette Shorts, MD ?  ? ?  ? ?TAKE  these medications   ? ?amLODipine 10 MG tablet ?Commonly known as: NORVASC ?amlodipine 10 mg tablet ? Take 1 tablet every day by oral route. ?  ?atenolol 25 MG tablet ?Commonly known as: TENORMIN ?Take 1 tablet by mouth daily. ?  ?atorvastatin 40 MG tablet ?Commonly known as: LIPITOR ?atorvastatin 40 mg tablet ?  ?cholecalciferol 1000 units tablet ?Commonly known as: VITAMIN D ?Take 1,000 Units by mouth daily. ?  ?ferrous sulfate 325 (65 FE) MG tablet ?Take 1 tablet (325 mg total) by mouth daily. ?What changed: when to take this ?  ?Fish Oil 1000 MG Caps ?Take 1 capsule by mouth daily. ?  ?glipiZIDE 10 MG tablet ?Commonly known as: GLUCOTROL ?glipizide 10 mg tablet ?  ?insulin NPH Human 100 UNIT/ML injection ?Commonly known as: NOVOLIN N ?Novolin N NPH U-100 Insulin isophane 100 unit/mL subcutaneous susp ? INJECT 10 UNITS SUBCUTANEOUSLY ONCE DAILY AT BEDTIME ?  ?levothyroxine 200 MCG tablet ?Commonly known as: SYNTHROID ?levothyroxine 200 mcg tablet ? Take 1 tablet every day by oral route for 30 days. ?  ?lisinopril 20 MG tablet ?Commonly known as: ZESTRIL ?lisinopril 20 mg tablet ? TAKE 1 TABLET BY MOUTH DAILY ?  ?metFORMIN 750 MG 24 hr tablet ?Commonly known as: GLUCOPHAGE-XR ?Take 1 tablet (750 mg total) by mouth daily with breakfast. ?Replaces: metFORMIN 1000 MG tablet ?Started by: Scarlette Shorts, MD ?  ?Norgestimate-Ethinyl Estradiol Triphasic 0.18/0.215/0.25 MG-35 MCG tablet ?Take 1 tablet by mouth daily. ?  ?Truvada 200-300 MG tablet ?Generic drug: emtricitabine-tenofovir ?Take 1 tablet by mouth daily. ?  ?vitamin C 500 MG tablet ?Commonly known as: ASCORBIC ACID ?Take 500 mg by mouth daily. ?  ? ?  ? ? ? ?ALLERGIES: ?Allergies  ?Allergen Reactions  ? Aspirin Itching and Rash  ? ? ? ?REVIEW OF SYSTEMS: ?A comprehensive ROS was conducted with the patient and is negative except as per HPI and below:  ?Review of Systems  ?Gastrointestinal:  Negative for diarrhea and nausea.  ?Genitourinary:  Negative for  frequency.  ?Endo/Heme/Allergies:  Negative for polydipsia.  ? ?  ?OBJECTIVE:  ? ?VITAL SIGNS: BP 130/74 (BP Location: Left Arm, Patient Position: Sitting, Cuff Size: Large)   Pulse 72   Ht 5\' 2"  (1.575 m)   Wt 220 lb (99.8 kg)   SpO2 99%   BMI 40.24 kg/m?   ? ?PHYSICAL EXAM:  ?General: Pt appears well and is in NAD  ?Neck: General: Supple without adenopathy or carotid bruits. ?Thyroid: Thyroid size normal.  No goiter or nodules appreciated.  ?Lungs: Clear with good BS bilat with no rales, rhonchi, or wheezes  ?Heart: RRR with normal S1 and S2 and no gallops; no murmurs; no rub  ?Abdomen: Normoactive bowel sounds, soft, nontender, without masses or  organomegaly palpable  ?Extremities:  ?Lower extremities - No pretibial edema. No lesions.  ?Neuro: MS is good with appropriate affect, pt is alert and Ox3  ? ? ?DM foot exam: 02/19/2022 ? ?The skin of the feet is intact without sores or ulcerations. ?The pedal pulses are 2+ on right and 2+ on left. ?The sensation is intact to a screening 5.07, 10 gram monofilament bilaterally ? ? ?DATA REVIEWED: ? ?Lab Results  ?Component Value Date  ? HGBA1C 12.1 (A) 02/19/2022  ? ? Latest Reference Range & Units 02/19/22 09:04  ?Sodium 135 - 145 mEq/L 135  ?Potassium 3.5 - 5.1 mEq/L 3.7  ?Chloride 96 - 112 mEq/L 100  ?CO2 19 - 32 mEq/L 27  ?Glucose 70 - 99 mg/dL 119396 (H)  ?BUN 6 - 23 mg/dL 12  ?Creatinine 0.40 - 1.20 mg/dL 1.470.64  ?Calcium 8.4 - 10.5 mg/dL 8.8  ?GFR >60.00 mL/min 105.59  ? ? Latest Reference Range & Units 02/19/22 09:04  ?TSH 0.35 - 5.50 uIU/mL 7.73 (H)  ? ? ? ? ? ? ?07/03/2021 ?A1c 9.0% ?TG 83 ?HDL 33 ?LDL 67 ?TSH 8.5 ?ASSESSMENT / PLAN / RECOMMENDATIONS:  ? ?1) Type 2 Diabetes Mellitus, Poorly controlled, Without complications - Most recent A1c of 12.1 %. Goal A1c < 7.0 %.   ? ? ?I have discussed with the patient the pathophysiology of diabetes. We went over the natural progression of the disease. We talked about both insulin resistance and insulin deficiency. We  stressed the importance of lifestyle changes including diet and exercise. I explained the complications associated with diabetes including retinopathy, nephropathy, neuropathy as well as increased risk of cardiovasc

## 2022-02-20 MED ORDER — LEVOTHYROXINE SODIUM 200 MCG PO TABS: 200.0000 ug | ORAL_TABLET | ORAL | 2 refills | Status: DC

## 2022-02-24 ENCOUNTER — Other Ambulatory Visit (HOSPITAL_COMMUNITY): Payer: Self-pay

## 2022-03-06 ENCOUNTER — Encounter: Payer: Self-pay | Admitting: Gastroenterology

## 2022-03-06 ENCOUNTER — Ambulatory Visit (AMBULATORY_SURGERY_CENTER): Payer: Self-pay | Admitting: Gastroenterology

## 2022-03-06 VITALS — BP 161/138 | HR 81 | Temp 96.8°F | Resp 16 | Ht 62.0 in | Wt 225.0 lb

## 2022-03-06 DIAGNOSIS — R195 Other fecal abnormalities: Secondary | ICD-10-CM

## 2022-03-06 DIAGNOSIS — K648 Other hemorrhoids: Secondary | ICD-10-CM

## 2022-03-06 DIAGNOSIS — K625 Hemorrhage of anus and rectum: Secondary | ICD-10-CM

## 2022-03-06 DIAGNOSIS — K5730 Diverticulosis of large intestine without perforation or abscess without bleeding: Secondary | ICD-10-CM

## 2022-03-06 MED ORDER — SODIUM CHLORIDE 0.9 % IV SOLN: 500.0000 mL | INTRAVENOUS | Status: DC

## 2022-03-06 NOTE — Progress Notes (Signed)
Report given to PACU, vss 

## 2022-03-06 NOTE — Patient Instructions (Signed)
Read all of the handouts given to you by your recovery room nurse. ? ?YOU HAD AN ENDOSCOPIC PROCEDURE TODAY AT THE [facility]:   Refer to the procedure report that was given to you for any specific questions about what was found during the examination.  If the procedure report does not answer your questions, please call your gastroenterologist to clarify.  If you requested that your care partner not be given the details of your procedure findings, then the procedure report has been included in a sealed envelope for you to review at your convenience later. ? ?YOU SHOULD EXPECT: Some feelings of bloating in the abdomen. Passage of more gas than usual.  Walking can help get rid of the air that was put into your GI tract during the procedure and reduce the bloating. If you had a lower endoscopy (such as a colonoscopy or flexible sigmoidoscopy) you may notice spotting of blood in your stool or on the toilet paper. If you underwent a bowel prep for your procedure, you may not have a normal bowel movement for a few days. ? ?Please Note:  You might notice some irritation and congestion in your nose or some drainage.  This is from the oxygen used during your procedure.  There is no need for concern and it should clear up in a day or so. ? ?SYMPTOMS TO REPORT IMMEDIATELY: ? ?Following lower endoscopy (colonoscopy or flexible sigmoidoscopy): ? Excessive amounts of blood in the stool ? Significant tenderness or worsening of abdominal pains ? Swelling of the abdomen that is new, acute ? Fever of 100?F or higher ? ? ?For urgent or emergent issues, a gastroenterologist can be reached at any hour by calling [phone]. ?Do not use MyChart messaging for urgent concerns.  ? ? ?DIET:  We do recommend a small meal at first, but then you may proceed to your regular diet.  Drink plenty of fluids but you should avoid alcoholic beverages for 24 hours. ? ?ACTIVITY:  You should plan to take it easy for the rest of today and  you should NOT DRIVE or use heavy machinery until tomorrow (because of the sedation medicines used during the test).   ? ?FOLLOW UP: ?Our staff will call the number listed on your records 48-72 hours following your procedure to check on you and address any questions or concerns that you may have regarding the information given to you following your procedure. If we do not reach you, we will leave a message.  We will attempt to reach you two times.  During this call, we will ask if you have developed any symptoms of COVID 19. If you develop any symptoms (ie: fever, flu-like symptoms, shortness of breath, cough etc.) before then, please call [phone].  If you test positive for Covid 19 in the 2 weeks post procedure, please call and report this information to us.   ? ? ?SIGNATURES/CONFIDENTIALITY: ?You and/or your care partner have signed paperwork which will be entered into your electronic medical record.  These signatures attest to the fact that that the information above on your After Visit Summary has been reviewed and is understood.  Full responsibility of the confidentiality of this discharge information lies with you and/or your care-partner.  ?

## 2022-03-06 NOTE — Op Note (Signed)
[facility] Patient Name: Kylie Baker Procedure Date: 03/06/2022 8:30 AM MRN: [id number] Endoscopist: Mallie Mussel L. Loletha Carrow , MD Age: 46 Referring MD:  Date of Birth: Jun 24, 1976 Gender: Female Account #: [id number] Procedure:                Colonoscopy Indications:              Heme positive stool (routine check at PCP)                           patient reports episodes of painless rectal                            bleeding every several months attributed to                            hemorrhoids Medicines:                Monitored Anesthesia Care Procedure:                Pre-Anesthesia Assessment:                           - Prior to the procedure, a History and Physical                            was performed, and patient medications and                            allergies were reviewed. The patient's tolerance of                            previous anesthesia was also reviewed. The risks                            and benefits of the procedure and the sedation                            options and risks were discussed with the patient.                            All questions were answered, and informed consent                            was obtained. Prior Anticoagulants: The patient has                            taken no previous anticoagulant or antiplatelet                            agents. ASA Grade Assessment: III - A patient with                            severe systemic disease. After reviewing the risks  and benefits, the patient was deemed in                            satisfactory condition to undergo the procedure.                           After obtaining informed consent, the colonoscope                            was passed under direct vision. Throughout the                            procedure, the patient's blood pressure, pulse, and                            oxygen saturations were monitored continuously. The                             CF [id number] was introduced through the anus                            and advanced to the the cecum, identified by                            appendiceal orifice and ileocecal valve. The                            colonoscopy was performed without difficulty. The                            patient tolerated the procedure well. The quality                            of the bowel preparation was excellent. The                            ileocecal valve, appendiceal orifice, and rectum                            were photographed. Scope In: 8:37:29 AM Scope Out: 8:51:41 AM Scope Withdrawal Time: 0 hours 10 minutes 54 seconds  Total Procedure Duration: 0 hours 14 minutes 12 seconds  Findings:                 The perianal and digital rectal examinations were                            normal.                           A few small-mouthed diverticula were found in the                            left colon.  Internal hemorrhoids were found. The hemorrhoids                            were small.                           Repeat examination of right colon under NBI                            performed.                           There is no endoscopic evidence of polyps in the                            entire colon.                           The exam was otherwise without abnormality on                            direct and retroflexion views. Complications:            No immediate complications. Estimated Blood Loss:     Estimated blood loss: none. Impression:               - Diverticulosis in the left colon.                           - Internal hemorrhoids.                           - The examination was otherwise normal on direct                            and retroflexion views.                           - No specimens collected.                           No cause for heme positive stool found. Recommendation:           - Patient has a contact  number available for                            emergencies. The signs and symptoms of potential                            delayed complications were discussed with the                            patient. Return to normal activities tomorrow.                            Written discharge instructions were provided to the  patient.                           - Resume previous diet.                           - Continue present medications.                           - Repeat colonoscopy in 10 years for screening                            purposes. Routine annual FOBT not recommended in                            the interim. Czar Ysaguirre L. Loletha Carrow, MD 03/06/2022 9:00:13 AM This report has been signed electronically.

## 2022-03-06 NOTE — Progress Notes (Signed)
History and Physical:  This patient presents for endoscopic testing for: Encounter Diagnosis  Name Primary?   Heme positive stool Yes    Clinical details in [facility] office consult note 11/20/2021.  Heme positive stool detected at PCP office, patient also reports intermittent painless rectal bleeding suspected to be hemorrhoidal in nature.  Patient is otherwise without complaints or active issues today.   Past Medical History: Past Medical History:  Diagnosis Date   Anemia    Asthma    Diabetes (HCC) 07/11/2017   Gestational diabetes 01/25/2012   Hypertension    Palpitations    Pregnancy induced hypertension    Thyroid disease      Past Surgical History: Past Surgical History:  Procedure Laterality Date   APPENDECTOMY  1980's   CESAREAN SECTION  06/06/2010   CESAREAN SECTION  03/27/2012   Procedure: CESAREAN SECTION;  Surgeon: Kathreen Cosier, MD;  Location: [facility];  Service: Gynecology;  Laterality: N/A;  Repeat Cesarean Section Delivery   Boy  @ 0005 , Apgars 8/9   CHOLECYSTECTOMY  04/22/2012   Procedure: LAPAROSCOPIC CHOLECYSTECTOMY WITH INTRAOPERATIVE CHOLANGIOGRAM;  Surgeon: Mariella Saa, MD;  Location: [facility];  Service: General;  Laterality: N/A;   DILATION AND EVACUATION N/A 07/24/2017   Procedure: DILATATION AND EVACUATION;  Surgeon: Tilda Burrow, MD;  Location: [facility];  Service: Gynecology;  Laterality: N/A;   THYROIDECTOMY     UMBILICAL HERNIA REPAIR  09/07/2012   Procedure: HERNIA REPAIR UMBILICAL ADULT;  Surgeon: Adolph Pollack, MD;  Location: [facility];  Service: General;  Laterality: N/A;  Repair of Incisional Incarcarated hernia    Allergies: Allergies  Allergen Reactions   Aspirin Itching and Rash    Outpatient Meds: Current Outpatient Medications  Medication Sig Dispense Refill   amLODipine (NORVASC) 10 MG tablet amlodipine 10 mg tablet  Take 1 tablet every day by oral route.     atenolol (TENORMIN) 25 MG tablet Take 1 tablet by mouth daily.      atorvastatin (LIPITOR) 40 MG tablet atorvastatin 40 mg tablet     emtricitabine-tenofovir (TRUVADA) 200-300 MG tablet Take 1 tablet by mouth daily. 30 tablet 2   glipiZIDE (GLUCOTROL) 10 MG tablet glipizide 10 mg tablet     insulin NPH Human (NOVOLIN N) 100 UNIT/ML injection Inject 0.12 mLs (12 Units total) into the skin 2 (two) times daily before a meal. 10 mL 6   levothyroxine (SYNTHROID) 200 MCG tablet Take 1 tablet (200 mcg total) by mouth as directed. 2 tablets on Sundays, 1 tablet Monday through Saturday 104 tablet 2   lisinopril (PRINIVIL,ZESTRIL) 20 MG tablet lisinopril 20 mg tablet  TAKE 1 TABLET BY MOUTH DAILY     metFORMIN (GLUCOPHAGE-XR) 750 MG 24 hr tablet Take 1 tablet (750 mg total) by mouth daily with breakfast. 90 tablet 3   Norgestimate-Ethinyl Estradiol Triphasic 0.18/0.215/0.25 MG-35 MCG tablet Take 1 tablet by mouth daily.     vitamin C (ASCORBIC ACID) 500 MG tablet Take 500 mg by mouth daily.     cholecalciferol (VITAMIN D) 1000 units tablet Take 1,000 Units by mouth daily. (Patient not taking: Reported on 03/06/2022)     ferrous sulfate 325 (65 FE) MG tablet Take 1 tablet (325 mg total) by mouth daily. (Patient taking differently: Take 325 mg by mouth 3 (three) times daily.) 30 tablet 0   Insulin Syringe-Needle U-100 (INSULIN SYRINGE .3CC/31GX5/16") 31G X 5/16" 0.3 ML MISC 1 Device by Does not apply route in the morning and  at bedtime. 200 each 3   Omega-3 Fatty Acids (FISH OIL) 1000 MG CAPS Take 1 capsule by mouth daily.      Current Facility-Administered Medications  Medication Dose Route Frequency Provider Last Rate Last Admin   0.9 %  sodium chloride infusion  500 mL Intravenous Continuous Danis, Starr Lake III, MD          ___________________________________________________________________ Objective   Exam:  BP 121/84   Pulse 84   Temp (!) 96.8 F (36 C)   Ht 5\' 2"  (1.575 m)   Wt 225 lb (102.1 kg)   LMP 10/17/2017   SpO2 99%   BMI 41.15 kg/m   CV: RRR  without murmur, S1/S2 Resp: clear to auscultation bilaterally, normal RR and effort noted GI: soft, no tenderness, with active bowel sounds.   Assessment: Encounter Diagnosis  Name Primary?   Heme positive stool Yes     Plan: Colonoscopy  The benefits and risks of the planned procedure were described in detail with the patient or (when appropriate) their health care proxy.  Risks were outlined as including, but not limited to, bleeding, infection, perforation, adverse medication reaction leading to cardiac or pulmonary decompensation, pancreatitis (if ERCP).  The limitation of incomplete mucosal visualization was also discussed.  No guarantees or warranties were given.    The patient is appropriate for an endoscopic procedure in the ambulatory setting.   - 10/19/2017, MD

## 2022-03-07 ENCOUNTER — Other Ambulatory Visit (HOSPITAL_COMMUNITY): Payer: Self-pay

## 2022-03-07 ENCOUNTER — Telehealth: Payer: Self-pay

## 2022-03-07 NOTE — Telephone Encounter (Signed)
  Follow up Call-     03/06/2022    7:44 AM  Call back number  Post procedure Call Back phone  # [phone]  Permission to leave phone message Yes     Patient questions:  Do you have a fever, pain , or abdominal swelling? No. Pain Score  0 *  Have you tolerated food without any problems? Yes.    Have you been able to return to your normal activities? Yes.    Do you have any questions about your discharge instructions: Diet   No. Medications  No. Follow up visit  No.  Do you have questions or concerns about your Care? No.  Actions: * If pain score is 4 or above: No action needed, pain <4.  Pt said he "butt" was sore from wiping.  I suggested pt try Destine, A&D Ointment, Vasoline or Aqaphor apply to behind where sore a few days.  If it does not help pt to call us pack.

## 2022-03-31 ENCOUNTER — Other Ambulatory Visit (HOSPITAL_COMMUNITY): Payer: Self-pay

## 2022-03-31 ENCOUNTER — Ambulatory Visit (INDEPENDENT_AMBULATORY_CARE_PROVIDER_SITE_OTHER): Payer: Self-pay | Admitting: Pharmacist

## 2022-03-31 ENCOUNTER — Other Ambulatory Visit: Payer: Self-pay | Admitting: Pharmacist

## 2022-03-31 ENCOUNTER — Other Ambulatory Visit: Payer: Self-pay

## 2022-03-31 DIAGNOSIS — Z79899 Other long term (current) drug therapy: Secondary | ICD-10-CM

## 2022-03-31 DIAGNOSIS — Z7251 High risk heterosexual behavior: Secondary | ICD-10-CM

## 2022-03-31 NOTE — Progress Notes (Signed)
Date:  03/31/2022   HPI: Kylie Baker is a 46 y.o. female who presents to the [facility] for HIV PrEP follow-up.  Insured   []    Uninsured  [x]    Patient Active Problem List   Diagnosis Date Noted   Postoperative hypothyroidism 02/19/2022   Type 2 diabetes mellitus with hyperglycemia, with long-term current use of insulin ([place]) 02/19/2022   High risk heterosexual behavior 01/07/2021   Vitamin B 12 deficiency 04/28/2018   Iron deficiency anemia 04/28/2018   Anemia 04/28/2018   Left elbow pain 11/04/2017   Syncope 07/20/2017   Symptomatic anemia 07/20/2017   Incomplete abortion with delayed or excessive hemorrhage 07/20/2017   Type 2 diabetes mellitus affecting pregnancy in first trimester, antepartum 07/11/2017   Goiter, nodular 07/04/2015   Vitamin D deficiency 03/27/2015   Unspecified essential hypertension 04/13/2014   Hypercalcemia 02/09/2014   Subclinical hyperthyroidism 02/09/2014   Incarcerated incisional hernia-s/p repair 09/07/12 10/05/2012   Supervision of high-risk pregnancy 01/25/2012   Gestational diabetes 01/25/2012   Chronic hypertension complicating or reason for care during pregnancy 01/25/2012   Asthma 01/25/2012    Patient's Medications  New Prescriptions   No medications on file  Previous Medications   AMLODIPINE (NORVASC) 10 MG TABLET    amlodipine 10 mg tablet  Take 1 tablet every day by oral route.   ATENOLOL (TENORMIN) 25 MG TABLET    Take 1 tablet by mouth daily.   ATORVASTATIN (LIPITOR) 40 MG TABLET    atorvastatin 40 mg tablet   CHOLECALCIFEROL (VITAMIN D) 1000 UNITS TABLET    Take 1,000 Units by mouth daily.   EMTRICITABINE-TENOFOVIR (TRUVADA) 200-300 MG TABLET    Take 1 tablet by mouth daily.   FERROUS SULFATE 325 (65 FE) MG TABLET    Take 1 tablet (325 mg total) by mouth daily.   GLIPIZIDE (GLUCOTROL) 10 MG TABLET    glipizide 10 mg tablet   INSULIN NPH HUMAN (NOVOLIN N) 100 UNIT/ML INJECTION    Inject 0.12 mLs (12 Units total)  into the skin 2 (two) times daily before a meal.   INSULIN SYRINGE-NEEDLE U-100 (INSULIN SYRINGE .3CC/31GX5/16") 31G X 5/16" 0.3 ML MISC    1 Device by Does not apply route in the morning and at bedtime.   LEVOTHYROXINE (SYNTHROID) 200 MCG TABLET    Take 1 tablet (200 mcg total) by mouth as directed. 2 tablets on Sundays, 1 tablet Monday through Saturday   LISINOPRIL (PRINIVIL,ZESTRIL) 20 MG TABLET    lisinopril 20 mg tablet  TAKE 1 TABLET BY MOUTH DAILY   METFORMIN (GLUCOPHAGE-XR) 750 MG 24 HR TABLET    Take 1 tablet (750 mg total) by mouth daily with breakfast.   NORGESTIMATE-ETHINYL ESTRADIOL TRIPHASIC 0.18/0.215/0.25 MG-35 MCG TABLET    Take 1 tablet by mouth daily.   OMEGA-3 FATTY ACIDS (FISH OIL) 1000 MG CAPS    Take 1 capsule by mouth daily.    VITAMIN C (ASCORBIC ACID) 500 MG TABLET    Take 500 mg by mouth daily.  Modified Medications   No medications on file  Discontinued Medications   No medications on file    Allergies: Allergies  Allergen Reactions   Aspirin Itching and Rash    Past Medical History: Past Medical History:  Diagnosis Date   Anemia    Asthma    Diabetes ([place]) 07/11/2017   Gestational diabetes 01/25/2012   Hypertension    Palpitations    Pregnancy induced hypertension    Thyroid disease  Social History: Social History   Socioeconomic History   Marital status: Married    Spouse name: Not on file   Number of children: Not on file   Years of education: Not on file   Highest education level: Not on file  Occupational History   Not on file  Tobacco Use   Smoking status: Never   Smokeless tobacco: Never  Vaping Use   Vaping Use: Never used  Substance and Sexual Activity   Alcohol use: No   Drug use: No   Sexual activity: Yes    Birth control/protection: Post-menopausal  Other Topics Concern   Not on file  Social History Narrative   Lives in [place] with spouse and children.  Unemployed.   Social Determinants of Health   Financial  Resource Strain: Not on file  Food Insecurity: Not on file  Transportation Needs: Not on file  Physical Activity: Not on file  Stress: Not on file  Social Connections: Not on file        No data to display          Labs:  SCr: Lab Results  Component Value Date   CREATININE 0.64 02/19/2022   CREATININE 0.60 04/08/2021   CREATININE 0.51 01/07/2021   CREATININE 0.73 08/29/2019   CREATININE 0.71 12/12/2018   HIV Lab Results  Component Value Date   HIV NON-REACTIVE 10/08/2021   HIV NON-REACTIVE 07/09/2021   HIV NON-REACTIVE 04/08/2021   HIV NON-REACTIVE 01/07/2021   HIV Non Reactive 07/11/2017   Hepatitis B Lab Results  Component Value Date   HEPBSAB NON-REACTIVE 01/07/2021   HEPBSAG NON-REACTIVE 01/07/2021   Hepatitis C Lab Results  Component Value Date   HEPCAB NON-REACTIVE 01/07/2021   Hepatitis A No results found for: "HAV" RPR and STI Lab Results  Component Value Date   LABRPR NON-REACTIVE 01/07/2021   LABRPR NON REACTIVE 03/26/2012   LABRPR Nonreactive 11/10/2011   LABRPR NON REACTIVE 06/06/2010    STI Results GC CT  01/07/2021 10:47 AM Negative  Negative   07/11/2017 12:00 AM Negative  Negative   11/18/2011  4:20 PM  NEGATIVE     Assessment: Kylie Baker is here today to follow up for HIV PrEP. She continues to take Truvada every day without any issues or side effects. She reports no missed doses. She has not started any new medications since I saw her in December. She does say that the health department started treatment for latent TB with isoniazid and pyridoxine, but she took it for 1 month and they told her to stop. Unsure the reason. She had her kidney function checked by her PCP in May and it was stable and within range. Her glucose continues to be elevated despite her telling me that she takes her diabetes medications as prescribed. Will leave that up to her PCP to manage.  She does inquire about the need for PrEP if her husband remains  undetectable. I explained U=U, but she would rather be safe and continue Truvada. Will check a HIV antibody today and send in 6 months of refills if she remains negative. No other partners and she politely declines STI testing today. She remains uninsured and is approved to receive Truvada through patient assistance until 02/05/23.  Plan: - HIV antibody today - Truvada x 6 months if HIV negative - F/u with me again on 12/11 at 845am  Tarin Navarez L. Dodi Leu, PharmD, BCIDP, AAHIVP, CPP Clinical Pharmacist Practitioner Infect[facility] [place] [facility] 03/31/2022, 8:49 AM

## 2022-04-01 ENCOUNTER — Other Ambulatory Visit (HOSPITAL_COMMUNITY): Payer: Self-pay

## 2022-04-01 ENCOUNTER — Other Ambulatory Visit: Payer: Self-pay | Admitting: Pharmacist

## 2022-04-01 DIAGNOSIS — Z7251 High risk heterosexual behavior: Secondary | ICD-10-CM

## 2022-04-01 LAB — HIV ANTIBODY (ROUTINE TESTING W REFLEX): HIV 1&2 Ab, 4th Generation: NONREACTIVE

## 2022-04-01 MED ORDER — EMTRICITABINE-TENOFOVIR DF 200-300 MG PO TABS
1.0000 | ORAL_TABLET | Freq: Every day | ORAL | 5 refills | Status: DC
  Filled 2022-04-01: qty 30, 30d supply, fill #0
  Filled 2022-04-21: qty 30, 30d supply, fill #1
  Filled 2022-05-20: qty 30, 30d supply, fill #2
  Filled 2022-06-16: qty 30, 30d supply, fill #3
  Filled 2022-07-09: qty 30, 30d supply, fill #4
  Filled 2022-08-18: qty 30, 30d supply, fill #5

## 2022-04-02 ENCOUNTER — Other Ambulatory Visit (HOSPITAL_COMMUNITY): Payer: Self-pay

## 2022-04-21 ENCOUNTER — Other Ambulatory Visit (HOSPITAL_COMMUNITY): Payer: Self-pay

## 2022-04-29 ENCOUNTER — Other Ambulatory Visit (HOSPITAL_COMMUNITY): Payer: Self-pay

## 2022-05-20 ENCOUNTER — Other Ambulatory Visit (HOSPITAL_COMMUNITY): Payer: Self-pay

## 2022-05-23 ENCOUNTER — Other Ambulatory Visit (HOSPITAL_COMMUNITY): Payer: Self-pay

## 2022-06-13 ENCOUNTER — Other Ambulatory Visit: Payer: Self-pay | Admitting: Internal Medicine

## 2022-06-13 ENCOUNTER — Other Ambulatory Visit (HOSPITAL_COMMUNITY): Payer: Self-pay

## 2022-06-16 ENCOUNTER — Other Ambulatory Visit: Payer: Self-pay | Admitting: Internal Medicine

## 2022-06-16 ENCOUNTER — Other Ambulatory Visit (HOSPITAL_COMMUNITY): Payer: Self-pay

## 2022-06-16 MED ORDER — GLIPIZIDE 10 MG PO TABS: ORAL_TABLET | ORAL | 0 refills | Status: DC

## 2022-06-19 ENCOUNTER — Other Ambulatory Visit (HOSPITAL_COMMUNITY): Payer: Self-pay

## 2022-06-24 NOTE — Progress Notes (Unsigned)
Name: Kylie Baker  MRN/ DOB: [id number], May 11, 1976   Age/ Sex: 46 y.o., female    PCP: Pcp, No   Reason for Endocrinology Evaluation: Type 2 Diabetes Mellitus/ Hypothyridism     Date of Initial Endocrinology Visit: 02/19/2022    PATIENT IDENTIFIER: Kylie Baker is a 46 y.o. female with a past medical history of T2Dm, HTN, Dyslipidemia and Hypothyroidism. Kylie patient presented for initial endocrinology clinic visit on 02/19/2022 for consultative assistance with her diabetes management.    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  She is on LT-4  replacement.  HerTSH has been fluctuating over Kylie years.    SUBJECTIVE:   During Kylie last visit (02/19/2022): A1c 12.1% we adjusted glycemic agents, and increased her levothyroxine  Today (06/25/22): Kylie Baker is here for a follow up on diabetes management.  She checks her blood sugars 1 times daily. Kylie patient has  had hypoglycemic episodes since Kylie last clinic visit, which typically occurred  once, since Kylie last visit. She was symptomatic, ambulance came due palpitations that were triggered by caffeine    She denies any nausea, vomiting or diarrhea   HOME ENDOCRINE REGIMEN: Metformin 750 mg XR with supper Glipizide 10 mg BID  NPH 12 units twice daily Levothyroxine 200 mcg, 2 tabs on Sundays and 1 tablet rest of Kylie week   Statin: Yes ACE-I/ARB: Yes    METER DOWNLOAD SUMMARY: did not bring   DIABETIC COMPLICATIONS: Microvascular complications:   Denies: neuropathy,  retinopathy,  Last eye exam: Completed   Macrovascular complications:   Denies: CAD, PVD, CVA   PAST HISTORY: Past Medical History:  Past Medical History:  Diagnosis Date   Anemia    Asthma    Diabetes ([facility]) 07/11/2017   Gestational diabetes 01/25/2012   Hypertension    Palpitations    Pregnancy induced hypertension    Thyroid disease    Past Surgical History:  Past Surgical History:  Procedure Laterality Date   APPENDECTOMY  1980's   CESAREAN SECTION  06/06/2010   CESAREAN SECTION  03/27/2012   Procedure: CESAREAN SECTION;  Surgeon: Kathreen Cosier, MD;  Location: [facility];  Service: Gynecology;  Laterality: N/A;  Repeat Cesarean Section Delivery   Boy  @ 0005 , Apgars 8/9   CHOLECYSTECTOMY  04/22/2012   Procedure: LAPAROSCOPIC CHOLECYSTECTOMY WITH INTRAOPERATIVE CHOLANGIOGRAM;  Surgeon: Mariella Saa, MD;  Location: [facility];  Service: General;  Laterality: N/A;   DILATION AND EVACUATION N/A 07/24/2017   Procedure: DILATATION AND EVACUATION;  Surgeon: Tilda Burrow, MD;  Location: [facility];  Service: Gynecology;  Laterality: N/A;   THYROIDECTOMY     UMBILICAL HERNIA REPAIR  09/07/2012   Procedure: HERNIA REPAIR UMBILICAL ADULT;  Surgeon: Adolph Pollack, MD;  Location: [facility];  Service: General;  Laterality: N/A;  Repair of Incisional Incarcarated hernia    Social History:  reports that she has never smoked. She has never used smokeless tobacco. She reports that she does not drink alcohol and  does not use drugs. Family History:  Family History  Problem Relation Age of Onset   Hypertension Mother    Diabetes Father    Asthma Father    Hypertension Father    Anesthesia problems Neg Hx    Colon cancer Neg Hx    Rectal cancer Neg Hx    Stomach cancer Neg Hx      HOME MEDICATIONS: Allergies as of 06/25/2022       Reactions   Aspirin Itching, Rash        Medication List        Accurate as of June 25, 2022  7:43 AM. If you have any questions, ask your  nurse or doctor.          STOP taking these medications    Fish Oil 1000 MG Caps Stopped by: Scarlette Shorts, MD       TAKE these medications    amLODipine 10 MG tablet Commonly known as: NORVASC amlodipine 10 mg tablet  Take 1 tablet every day by oral route.   ascorbic acid 500 MG tablet Commonly known as: VITAMIN C Take 500 mg by mouth daily.   atenolol 25 MG tablet Commonly known as: TENORMIN Take 1 tablet by mouth daily.   atorvastatin 40 MG tablet Commonly known as: LIPITOR atorvastatin 40 mg tablet   cholecalciferol 1000 units tablet Commonly known as: VITAMIN D Take 1,000 Units by mouth daily.   ferrous sulfate 325 (65 FE) MG tablet Take 1 tablet (325 mg total) by mouth daily. What changed: when to take this   glipiZIDE 10 MG tablet Commonly known as: GLUCOTROL 1 tablet before Breakfast and Before Supper   insulin NPH Human 100 UNIT/ML injection Commonly known as: NOVOLIN N Inject 0.12 mLs (12 Units total) into Kylie skin 2 (two) times daily before a meal.   INSULIN SYRINGE .3CC/31GX5/16" 31G X 5/16" 0.3 ML Misc 1 Device by Does not apply route in Kylie morning and at bedtime.   levothyroxine 200 MCG tablet Commonly known as: SYNTHROID Take 1 tablet (200 mcg total) by mouth as directed. 2 tablets on Sundays, 1 tablet Monday through Saturday   lisinopril 20 MG tablet Commonly known as: ZESTRIL lisinopril 20 mg tablet  TAKE 1 TABLET BY MOUTH DAILY   metFORMIN 750 MG 24 hr tablet Commonly known as: GLUCOPHAGE-XR Take 1 tablet (750 mg total) by mouth daily with breakfast.   Norgestimate-Ethinyl Estradiol Triphasic 0.18/0.215/0.25 MG-35 MCG tablet Take 1 tablet by mouth daily.   Truvada 200-300 MG tablet Generic drug: emtricitabine-tenofovir Take 1 tablet by mouth daily.         ALLERGIES: Allergies  Allergen Reactions   Aspirin Itching and Rash       OBJECTIVE:   VITAL SIGNS: BP 124/82 (BP Location: Left Arm, Patient Position:  Sitting, Cuff Size: Large)   Pulse 93   Ht 5\' 2"  (1.575 m)   Wt 216 lb (98 kg)   LMP 10/17/2017   SpO2 99%   BMI 39.51 kg/m    PHYSICAL EXAM:  General: Baker appears well and is in NAD  Neck: General: Supple without adenopathy or carotid bruits. Thyroid: Thyroid size normal.  No goiter or nodules appreciated.  Lungs: Clear with good BS bilat   Heart: RRR   Abdomen: Normoactive bowel sounds, soft, nontender, without masses or organomegaly palpable  Extremities:  Lower extremities - No pretibial edema. No lesions.  Neuro: MS is good with appropriate affect, Baker is alert and Ox3  DM foot exam: 02/19/2022  Kylie skin of Kylie feet is intact without sores or ulcerations. Kylie pedal pulses are 2+ on right and 2+ on left. Kylie sensation is intact to a screening 5.07, 10 gram monofilament bilaterally   DATA REVIEWED:  Lab Results  Component Value Date   HGBA1C 12.1 (A) 02/19/2022     Latest Reference Range & Units 06/25/22 08:05  TSH 0.35 - 5.50 uIU/mL 0.01 Repeated and verified X2. (L) (C)      Latest Reference Range & Units 02/19/22 09:04  Sodium 135 - 145 mEq/L 135  Potassium 3.5 - 5.1 mEq/L 3.7  Chloride 96 - 112 mEq/L 100  CO2 19 - 32 mEq/L 27  Glucose 70 - 99 mg/dL 396 (H)  BUN 6 - 23 mg/dL 12  Creatinine 0.40 - 1.20 mg/dL 0.64  Calcium 8.4 - 10.5 mg/dL 8.8  GFR >60.00 mL/min 105.59     07/03/2021 A1c 9.0% TG 83 HDL 33 LDL 67 TSH 8.5  In office BG 126 mg/dL     ASSESSMENT / PLAN / RECOMMENDATIONS:   1) Type 2 Diabetes Mellitus, Optimally  controlled, Without complications - Most recent A1c of 6.5 %. Goal A1c < 7.0 %.    - Praised Kylie Baker on improved glucose control and weight loss  - A1c trended down from 12.1% to 6.5%  - She had one episode of hypoglycemia in Kylie morning , will reduce am insulin  She is intolerant to BID dosing of metformin , we switched to  XR formulation and doing well on it    MEDICATIONS:  - Continue Metformin 750 mg XR , 1 tablet  with Dinner - Continue Glipizide 10 mg , 1 tablet before Breakfast and Before Supper  - Take ReliOn-N ( NPH) 10 units QAM and 12 units QHS  EDUCATION / INSTRUCTIONS: BG monitoring instructions: Patient is instructed to check her blood sugars 1 times a day, fasting . Call [facility] if: BG persistently < 70  I reviewed Kylie Rule of 15 for Kylie treatment of hypoglycemia in detail with Kylie patient. Literature supplied.   2) Diabetic complications:  Eye: Does not  have known diabetic retinopathy.  Neuro/ Feet: Does not have known diabetic peripheral neuropathy. Renal: Patient does not have known baseline CKD. She is  on an ACEI/ARB at present.    3) Postoperative Hypothyroidism:   - Baker is clinically euthyroid  - No local neck symptoms  -Her TSH went from 7.73 u IU/mL to <0.001 u IU/mL, with adding an additional 200 mcg on Sundays -I will reduce her back to original dose of levothyroxine 1 tablet daily and recheck in 2 months    Medication  Decrease levothyroxine 200 mcg , 1 tablet daily   Follow-up in 6 months  Signed electronically by: Mack Guise, MD  [facility]  [facility] [address] [place], [place] [address] Phone: [phone] FAX: [phone]   CC: Pcp, No No address on file Phone: None  Fax: None    Return to Endocrinology clinic as below: Future Appointments  Date Time Provider [place]  06/25/2022  7:50 AM Russie Gulledge, Melanie Crazier, MD [facility] None  09/29/2022  8:45 AM Kuppelweiser, Gillian Shields, RPH-CPP [facility]

## 2022-06-25 ENCOUNTER — Telehealth: Payer: Self-pay | Admitting: Internal Medicine

## 2022-06-25 ENCOUNTER — Ambulatory Visit (INDEPENDENT_AMBULATORY_CARE_PROVIDER_SITE_OTHER): Payer: Self-pay | Admitting: Internal Medicine

## 2022-06-25 ENCOUNTER — Encounter: Payer: Self-pay | Admitting: Internal Medicine

## 2022-06-25 VITALS — BP 124/82 | HR 93 | Ht 62.0 in | Wt 216.0 lb

## 2022-06-25 DIAGNOSIS — E890 Postprocedural hypothyroidism: Secondary | ICD-10-CM

## 2022-06-25 DIAGNOSIS — Z794 Long term (current) use of insulin: Secondary | ICD-10-CM

## 2022-06-25 DIAGNOSIS — E119 Type 2 diabetes mellitus without complications: Secondary | ICD-10-CM | POA: Insufficient documentation

## 2022-06-25 LAB — TSH: TSH: 0.01 u[IU]/mL — ABNORMAL LOW (ref 0.35–5.50)

## 2022-06-25 LAB — POCT GLYCOSYLATED HEMOGLOBIN (HGB A1C): Hemoglobin A1C: 6.5 % — AB (ref 4.0–5.6)

## 2022-06-25 LAB — POCT GLUCOSE (DEVICE FOR HOME USE): POC Glucose: 126 mg/dl — AB (ref 70–99)

## 2022-06-25 MED ORDER — INSULIN NPH (HUMAN) (ISOPHANE) 100 UNIT/ML ~~LOC~~ SUSP
SUBCUTANEOUS | 3 refills | Status: DC
Start: 2022-06-25 — End: 2022-12-24

## 2022-06-25 MED ORDER — LEVOTHYROXINE SODIUM 200 MCG PO TABS
200.0000 ug | ORAL_TABLET | Freq: Every day | ORAL | 2 refills | Status: DC
Start: 2022-06-25 — End: 2022-12-24

## 2022-06-25 MED ORDER — METFORMIN HCL ER 750 MG PO TB24: 750.0000 mg | ORAL_TABLET | Freq: Every day | ORAL | 3 refills | Status: DC

## 2022-06-25 MED ORDER — GLIPIZIDE 10 MG PO TABS
ORAL_TABLET | ORAL | 3 refills | Status: DC
Start: 2022-06-25 — End: 2022-12-24

## 2022-06-25 NOTE — Telephone Encounter (Signed)
Patient notified and will change medication as advise

## 2022-06-25 NOTE — Patient Instructions (Addendum)
-   Continue  Metformin 750 mg XR , 1 tablet with Dinner - Continue Glipizide 10 mg , 1 tablet before Breakfast and 1 tablet Before Supper  - Take ReliOn-N ( NPH) 10  units every morning and 12 units at bedtime      HOW TO TREAT LOW BLOOD SUGARS (Blood sugar LESS THAN 70 MG/DL) Please follow the RULE OF 15 for the treatment of hypoglycemia treatment (when your (blood sugars are less than 70 mg/dL)   STEP 1: Take 15 grams of carbohydrates when your blood sugar is low, which includes:  3-4 GLUCOSE TABS  OR 3-4 OZ OF JUICE OR REGULAR SODA OR ONE TUBE OF GLUCOSE GEL    STEP 2: RECHECK blood sugar in 15 MINUTES STEP 3: If your blood sugar is still low at the 15 minute recheck --> then, go back to STEP 1 and treat AGAIN with another 15 grams of carbohydrates. 

## 2022-06-25 NOTE — Telephone Encounter (Signed)
Please let the patient know that her thyroid test shows that she is on too much levothyroxine.    Please asked the patient to back off on levothyroxine and to take  1 tablet daily including Sundays  (currently she is taking 2 tablets on Sundays)   Please schedule her for a repeat thyroid test in 2 months   Thanks

## 2022-07-09 ENCOUNTER — Other Ambulatory Visit (HOSPITAL_COMMUNITY): Payer: Self-pay

## 2022-07-22 ENCOUNTER — Other Ambulatory Visit (HOSPITAL_COMMUNITY): Payer: Self-pay

## 2022-07-23 ENCOUNTER — Other Ambulatory Visit (HOSPITAL_COMMUNITY): Payer: Self-pay

## 2022-08-18 ENCOUNTER — Other Ambulatory Visit (HOSPITAL_COMMUNITY): Payer: Self-pay

## 2022-08-20 ENCOUNTER — Other Ambulatory Visit (HOSPITAL_COMMUNITY): Payer: Self-pay

## 2022-08-28 ENCOUNTER — Other Ambulatory Visit (INDEPENDENT_AMBULATORY_CARE_PROVIDER_SITE_OTHER): Payer: Self-pay

## 2022-08-28 DIAGNOSIS — E890 Postprocedural hypothyroidism: Secondary | ICD-10-CM

## 2022-08-28 LAB — TSH: TSH: 0.01 u[IU]/mL — ABNORMAL LOW (ref 0.35–5.50)

## 2022-09-08 ENCOUNTER — Other Ambulatory Visit (HOSPITAL_COMMUNITY): Payer: Self-pay

## 2022-09-08 ENCOUNTER — Other Ambulatory Visit: Payer: Self-pay | Admitting: Pharmacist

## 2022-09-08 DIAGNOSIS — Z7251 High risk heterosexual behavior: Secondary | ICD-10-CM

## 2022-09-25 ENCOUNTER — Other Ambulatory Visit (HOSPITAL_COMMUNITY): Payer: Self-pay

## 2022-09-25 ENCOUNTER — Other Ambulatory Visit: Payer: Self-pay | Admitting: Pharmacist

## 2022-09-25 DIAGNOSIS — Z7251 High risk heterosexual behavior: Secondary | ICD-10-CM

## 2022-09-25 MED ORDER — EMTRICITABINE-TENOFOVIR DF 200-300 MG PO TABS
1.0000 | ORAL_TABLET | Freq: Every day | ORAL | 0 refills | Status: DC
  Filled 2022-09-25: qty 30, 30d supply, fill #0

## 2022-09-25 NOTE — Progress Notes (Signed)
Refilling x 30 days until patient can come to her appointment.  Kylie Baker, PharmD [facility] Clinical Pharmacist Practitioner

## 2022-09-26 ENCOUNTER — Other Ambulatory Visit: Payer: Self-pay

## 2022-09-29 ENCOUNTER — Ambulatory Visit: Payer: Self-pay | Admitting: Pharmacist

## 2022-09-30 ENCOUNTER — Other Ambulatory Visit (HOSPITAL_COMMUNITY): Payer: Self-pay

## 2022-10-02 ENCOUNTER — Ambulatory Visit (INDEPENDENT_AMBULATORY_CARE_PROVIDER_SITE_OTHER): Payer: Self-pay | Admitting: Pharmacist

## 2022-10-02 ENCOUNTER — Other Ambulatory Visit: Payer: Self-pay

## 2022-10-02 DIAGNOSIS — Z79899 Other long term (current) drug therapy: Secondary | ICD-10-CM

## 2022-10-02 NOTE — Progress Notes (Signed)
Date:  10/02/2022   HPI: Kylie Baker is a 46 y.o. female who presents to the [facility] clinic for HIV PrEP follow-up.  Insured   []    Uninsured  [x]    Patient Active Problem List   Diagnosis Date Noted   Diabetes mellitus without complication ([facility]) 06/25/2022   Postoperative hypothyroidism 02/19/2022   Type 2 diabetes mellitus with hyperglycemia, with long-term current use of insulin ([facility]) 02/19/2022   High risk heterosexual behavior 01/07/2021   Vitamin B 12 deficiency 04/28/2018   Iron deficiency anemia 04/28/2018   Anemia 04/28/2018   Left elbow pain 11/04/2017   Syncope 07/20/2017   Symptomatic anemia 07/20/2017   Incomplete abortion with delayed or excessive hemorrhage 07/20/2017   Type 2 diabetes mellitus affecting pregnancy in first trimester, antepartum 07/11/2017   Goiter, nodular 07/04/2015   Vitamin D deficiency 03/27/2015   Unspecified essential hypertension 04/13/2014   Hypercalcemia 02/09/2014   Subclinical hyperthyroidism 02/09/2014   Incarcerated incisional hernia-s/p repair 09/07/12 10/05/2012   Supervision of high-risk pregnancy 01/25/2012   Gestational diabetes 01/25/2012   Chronic hypertension complicating or reason for care during pregnancy 01/25/2012   Asthma 01/25/2012    Patient's Medications  New Prescriptions   No medications on file  Previous Medications   AMLODIPINE (NORVASC) 10 MG TABLET    amlodipine 10 mg tablet  Take 1 tablet every day by oral route.   ATENOLOL (TENORMIN) 25 MG TABLET    Take 1 tablet by mouth daily.   ATORVASTATIN (LIPITOR) 40 MG TABLET    atorvastatin 40 mg tablet   CHOLECALCIFEROL (VITAMIN D) 1000 UNITS TABLET    Take 1,000 Units by mouth daily.   EMTRICITABINE-TENOFOVIR (TRUVADA) 200-300 MG TABLET    Take 1 tablet by mouth daily.   FERROUS SULFATE 325 (65 FE) MG TABLET    Take 1 tablet (325 mg total) by mouth daily.   GLIPIZIDE (GLUCOTROL) 10 MG TABLET    1 tablet before Breakfast and Before Supper   INSULIN  NPH HUMAN (NOVOLIN N) 100 UNIT/ML INJECTION    Inject 0.1 mLs (10 Units total) into the skin daily before breakfast AND 0.12 mLs (12 Units total) at bedtime.   INSULIN SYRINGE-NEEDLE U-100 (INSULIN SYRINGE .3CC/31GX5/16") 31G X 5/16" 0.3 ML MISC    1 Device by Does not apply route in the morning and at bedtime.   LEVOTHYROXINE (SYNTHROID) 200 MCG TABLET    Take 1 tablet (200 mcg total) by mouth daily before breakfast.   LISINOPRIL (PRINIVIL,ZESTRIL) 20 MG TABLET    lisinopril 20 mg tablet  TAKE 1 TABLET BY MOUTH DAILY   METFORMIN (GLUCOPHAGE-XR) 750 MG 24 HR TABLET    Take 1 tablet (750 mg total) by mouth daily with breakfast.   NORGESTIMATE-ETHINYL ESTRADIOL TRIPHASIC 0.18/0.215/0.25 MG-35 MCG TABLET    Take 1 tablet by mouth daily.   VITAMIN C (ASCORBIC ACID) 500 MG TABLET    Take 500 mg by mouth daily.  Modified Medications   No medications on file  Discontinued Medications   No medications on file    Allergies: Allergies  Allergen Reactions   Aspirin Itching and Rash    Past Medical History: Past Medical History:  Diagnosis Date   Anemia    Asthma    Diabetes (HCC) 07/11/2017   Gestational diabetes 01/25/2012   Hypertension    Palpitations    Pregnancy induced hypertension    Thyroid disease     Social History: Social History   Socioeconomic History   Marital  status: Married    Spouse name: Not on file   Number of children: Not on file   Years of education: Not on file   Highest education level: Not on file  Occupational History   Not on file  Tobacco Use   Smoking status: Never   Smokeless tobacco: Never  Vaping Use   Vaping Use: Never used  Substance and Sexual Activity   Alcohol use: No   Drug use: No   Sexual activity: Yes    Birth control/protection: Post-menopausal  Other Topics Concern   Not on file  Social History Narrative   Lives in [place] with spouse and children.  Unemployed.   Social Determinants of Health   Financial Resource Strain:  Not on file  Food Insecurity: Not on file  Transportation Needs: Not on file  Physical Activity: Not on file  Stress: Not on file  Social Connections: Not on file        No data to display          Labs:  SCr: Lab Results  Component Value Date   CREATININE 0.64 02/19/2022   CREATININE 0.60 04/08/2021   CREATININE 0.51 01/07/2021   CREATININE 0.73 08/29/2019   CREATININE 0.71 12/12/2018   HIV Lab Results  Component Value Date   HIV NON-REACTIVE 03/31/2022   HIV NON-REACTIVE 10/08/2021   HIV NON-REACTIVE 07/09/2021   HIV NON-REACTIVE 04/08/2021   HIV NON-REACTIVE 01/07/2021   Hepatitis B Lab Results  Component Value Date   HEPBSAB NON-REACTIVE 01/07/2021   HEPBSAG NON-REACTIVE 01/07/2021   Hepatitis C Lab Results  Component Value Date   HEPCAB NON-REACTIVE 01/07/2021   Hepatitis A No results found for: "HAV" RPR and STI Lab Results  Component Value Date   LABRPR NON-REACTIVE 01/07/2021   LABRPR NON REACTIVE 03/26/2012   LABRPR Nonreactive 11/10/2011   LABRPR NON REACTIVE 06/06/2010    STI Results GC CT  01/07/2021 10:47 AM Negative  Negative   07/11/2017 12:00 AM Negative  Negative   11/18/2011  4:20 PM  NEGATIVE     Assessment: Kylie Baker is here today to follow up for HIV PrEP. She is doing well on Truvada and states that she does not miss any doses. No problems today. She follows up in 6 months as her husband is HIV positive and undetectable. Will check labs and see her back in June.   Plan: - HIV antibody today - Truvada x 6 months if HIV negative - F/u with me again 03/30/23  Collin Hendley L. Concepcion Gillott, PharmD, BCIDP, AAHIVP, CPP Clinical Pharmacist Practitioner Infectious Diseases [facility] 10/02/2022, 8:56 AM

## 2022-10-03 LAB — HIV ANTIBODY (ROUTINE TESTING W REFLEX): HIV 1&2 Ab, 4th Generation: NONREACTIVE

## 2022-10-06 ENCOUNTER — Other Ambulatory Visit (HOSPITAL_COMMUNITY): Payer: Self-pay

## 2022-10-06 ENCOUNTER — Other Ambulatory Visit: Payer: Self-pay | Admitting: Pharmacist

## 2022-10-06 ENCOUNTER — Other Ambulatory Visit: Payer: Self-pay

## 2022-10-06 DIAGNOSIS — Z7251 High risk heterosexual behavior: Secondary | ICD-10-CM

## 2022-10-06 MED ORDER — EMTRICITABINE-TENOFOVIR DF 200-300 MG PO TABS
1.0000 | ORAL_TABLET | Freq: Every day | ORAL | 5 refills | Status: AC
  Filled 2022-10-06 – 2022-10-15 (×2): qty 30, 30d supply, fill #0
  Filled 2022-11-11: qty 30, 30d supply, fill #1
  Filled 2022-12-09 – 2022-12-19 (×3): qty 30, 30d supply, fill #2
  Filled 2023-01-19 – 2023-01-30 (×2): qty 30, 30d supply, fill #3
  Filled 2023-02-24 – 2023-03-03 (×2): qty 30, 30d supply, fill #4
  Filled 2023-03-24: qty 30, 30d supply, fill #5

## 2022-10-15 ENCOUNTER — Other Ambulatory Visit (HOSPITAL_COMMUNITY): Payer: Self-pay

## 2022-10-21 ENCOUNTER — Other Ambulatory Visit (HOSPITAL_COMMUNITY): Payer: Self-pay

## 2022-11-11 ENCOUNTER — Other Ambulatory Visit (HOSPITAL_COMMUNITY): Payer: Self-pay

## 2022-11-14 ENCOUNTER — Other Ambulatory Visit (HOSPITAL_COMMUNITY): Payer: Self-pay

## 2022-12-09 ENCOUNTER — Other Ambulatory Visit (HOSPITAL_COMMUNITY): Payer: Self-pay

## 2022-12-16 ENCOUNTER — Other Ambulatory Visit (HOSPITAL_COMMUNITY): Payer: Self-pay

## 2022-12-17 ENCOUNTER — Other Ambulatory Visit (HOSPITAL_COMMUNITY): Payer: Self-pay

## 2022-12-19 ENCOUNTER — Other Ambulatory Visit: Payer: Self-pay

## 2022-12-19 ENCOUNTER — Other Ambulatory Visit (HOSPITAL_COMMUNITY): Payer: Self-pay

## 2022-12-22 ENCOUNTER — Other Ambulatory Visit: Payer: Self-pay

## 2022-12-23 NOTE — Progress Notes (Unsigned)
Name: Kylie Baker  MRN/ DOB: [id number], 09/13/1976   Age/ Sex: 47 y.o., female    PCP: Pcp, No   Reason for Endocrinology Evaluation: Type 2 Diabetes Mellitus/ Hypothyridism     Date of Initial Endocrinology Visit: 02/19/2022    PATIENT IDENTIFIER: Ms. Kylie Baker is a 47 y.o. female with a past medical history of T2Dm, HTN, Dyslipidemia and Hypothyroidism. The patient presented for initial endocrinology clinic visit on 02/19/2022 for consultative assistance with her diabetes management.    HPI: Ms. 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  She is on LT-4  replacement.  HerTSH has been fluctuating over the years.    SUBJECTIVE:   During the last visit (06/25/2022): A1c 6.5%     Today (12/24/22): Ms. Kylie Baker is here for a follow up on diabetes management.  She checks her blood sugars 1 times daily. The patient has not  had hypoglycemic episodes since the last clinic visit.  She lost father in December, 2023 but forgot her meds when she traveled   She had palpitations in December before her travel Has right  hand tingling  Denies feet tingling  Denies diarrhea  Denies vomiting   She had recent labs at [facility] office 11/23/2022 , not available at this time   [place]: Metformin 750 mg XR with supper Glipizide 10 mg BID  NPH 10 units QAM and 12 units QHS Levothyroxine 200 mcg, 1 tab 6 days a week      Statin: Yes ACE-I/ARB: Yes    METER DOWNLOAD SUMMARY: did not bring     DIABETIC COMPLICATIONS: Microvascular  complications:   Denies: neuropathy, retinopathy,  Last eye exam: Completed   Macrovascular complications:   Denies: CAD, PVD, CVA   PAST HISTORY: Past Medical History:  Past Medical History:  Diagnosis Date   Anemia    Asthma    Diabetes ([place]) 07/11/2017   Gestational diabetes 01/25/2012   Hypertension    Palpitations    Pregnancy induced hypertension    Thyroid disease    Past Surgical History:  Past Surgical History:  Procedure Laterality Date   APPENDECTOMY  1980's   CESAREAN SECTION  06/06/2010   CESAREAN SECTION  03/27/2012   Procedure: CESAREAN SECTION;  Surgeon: Frederico Hamman, MD;  Location: [facility];  Service: Gynecology;  Laterality: N/A;  Repeat Cesarean Section Delivery   Boy  @ 0005 , Apgars 8/9   CHOLECYSTECTOMY  04/22/2012   Procedure: LAPAROSCOPIC CHOLECYSTECTOMY WITH INTRAOPERATIVE CHOLANGIOGRAM;  Surgeon: Edward Jolly, MD;  Location: [facility];  Service: General;  Laterality: N/A;   DILATION AND EVACUATION N/A 07/24/2017   Procedure: DILATATION AND EVACUATION;  Surgeon: Jonnie Kind, MD;  Location: [facility];  Service: Gynecology;  Laterality: N/A;   THYROIDECTOMY     UMBILICAL HERNIA REPAIR  09/07/2012   Procedure: HERNIA REPAIR UMBILICAL ADULT;  Surgeon: Odis Hollingshead, MD;  Location: [facility];  Service: General;  Laterality: N/A;  Repair of Incisional Incarcarated hernia    Social  History:  reports that she has never smoked. She has never used smokeless tobacco. She reports that she does not drink alcohol and does not use drugs. Family History:  Family History  Problem Relation Age of Onset   Hypertension Mother    Diabetes Father    Asthma Father    Hypertension Father    Anesthesia problems Neg Hx    Colon cancer Neg Hx    Rectal cancer Neg Hx    Stomach cancer Neg Hx      HOME MEDICATIONS: Allergies as of 12/24/2022       Reactions   Aspirin Itching, Rash        Medication List        Accurate as of December 24, 2022  7:35 AM. If  you have any questions, ask your nurse or doctor.          amLODipine 10 MG tablet Commonly known as: NORVASC amlodipine 10 mg tablet  Take 1 tablet every day by oral route.   ascorbic acid 500 MG tablet Commonly known as: VITAMIN C Take 500 mg by mouth daily.   atenolol 25 MG tablet Commonly known as: TENORMIN Take 1 tablet by mouth daily.   atorvastatin 40 MG tablet Commonly known as: LIPITOR atorvastatin 40 mg tablet   cholecalciferol 1000 units tablet Commonly known as: VITAMIN D Take 1,000 Units by mouth daily.   ferrous sulfate 325 (65 FE) MG tablet Take 1 tablet (325 mg total) by mouth daily. What changed: when to take this   glipiZIDE 10 MG tablet Commonly known as: GLUCOTROL 1 tablet before Breakfast and Before Supper   insulin NPH Human 100 UNIT/ML injection Commonly known as: NOVOLIN N Inject 0.1 mLs (10 Units total) into the skin daily before breakfast AND 0.12 mLs (12 Units total) at bedtime.   INSULIN SYRINGE .3CC/31GX5/16" 31G X 5/16" 0.3 ML Misc 1 Device by Does not apply route in the morning and at bedtime.   levothyroxine 200 MCG tablet Commonly known as: SYNTHROID Take 1 tablet (200 mcg total) by mouth daily before breakfast.   lisinopril 20 MG tablet Commonly known as: ZESTRIL lisinopril 20 mg tablet  TAKE 1 TABLET BY MOUTH DAILY   metFORMIN 750 MG 24 hr tablet Commonly known as: GLUCOPHAGE-XR Take 1 tablet (750 mg total) by mouth daily with breakfast.   Norgestimate-Ethinyl Estradiol Triphasic 0.18/0.215/0.25 MG-35 MCG tablet Take 1 tablet by mouth daily.   Truvada 200-300 MG tablet Generic drug: emtricitabine-tenofovir Take 1 tablet by mouth daily.         ALLERGIES: Allergies  Allergen Reactions   Aspirin Itching and Rash       OBJECTIVE:   VITAL SIGNS: BP 126/84 (BP Location: Left Arm, Patient Position: Sitting, Cuff Size: Large)   Pulse 95   Ht '5\' 2"'$  (1.575 m)   Wt 232 lb (105.2 kg)   LMP 10/17/2017   SpO2 (!)  78%   BMI 42.43 kg/m    PHYSICAL EXAM:  General: Pt appears well and is in NAD  Neck: General: Supple without adenopathy or carotid bruits. Thyroid: Thyroid size normal.  No goiter or nodules appreciated.  Lungs: Clear with good BS bilat   Heart: RRR   Abdomen:  soft, nontender, without masses or organomegaly palpable  Extremities:  Lower extremities -Trace  pretibial edema.   Neuro: MS is good with appropriate affect, pt is alert and Ox3    DM foot exam: 12/24/2022  The skin of the feet is intact  without sores or ulcerations. The pedal pulses are 2+ on right and 2+ on left. The sensation is intact to a screening 5.07, 10 gram monofilament bilaterally   DATA REVIEWED:  Lab Results  Component Value Date   HGBA1C 7.2 (A) 12/24/2022   HGBA1C 6.5 (A) 06/25/2022   HGBA1C 12.1 (A) 02/19/2022        In office BG 117 mg/dL     ASSESSMENT / PLAN / RECOMMENDATIONS:   1) Type 2 Diabetes Mellitus, Sub-Optimally  controlled, Without complications - Most recent A1c of 7.2%. Goal A1c < 7.0 %.    -Her A1c has trended up from 6.5% to 7.2%, unfortunately she has lost her father and had to travel for a month and was not taking any of her glycemic agents as she forgot all her medications. -She is intolerant to BID dosing of metformin  -No changes at this time -She had labs through her PCPs office 11/23/2022 that per patient showed low HDL and elevated LFTs, these records are not available at this time  MEDICATIONS:  - Continue Metformin 750 mg XR , 1 tablet with Dinner - Continue Glipizide 10 mg , 1 tablet before Breakfast and Before Supper  -Continue ReliOn-N ( NPH) 10 units QAM and 12 units QHS  EDUCATION / INSTRUCTIONS: BG monitoring instructions: Patient is instructed to check her blood sugars 1 times a day, fasting . Call [facility] if: BG persistently < 70  I reviewed the Rule of 15 for the treatment of hypoglycemia in detail with the patient. Literature  supplied.   2) Diabetic complications:  Eye: Does not  have known diabetic retinopathy.  Neuro/ Feet: Does not have known diabetic peripheral neuropathy. Renal: Patient does not have known baseline CKD. She is  on an ACEI/ARB at present.    3) Postoperative Hypothyroidism:   - Pt is clinically euthyroid  - No local neck symptoms  -TSH continues to be low, will decrease levothyroxine as below   Medication  Stop levothyroxine 200 mcg , 6 days a week(skip Saturdays) Start levothyroxine 150 mcg daily  Follow-up in 6 months  Signed electronically by: Mack Guise, MD  [facility]  [facility] [address] [place], [place] [address] Phone: [phone] FAX: [phone]   CC: Pcp, No No address on file Phone: None  Fax: None    Return to Endocrinology clinic as below: Future Appointments  Date Time Provider [place]  03/30/2023  8:45 AM Kuppelweiser, Gillian Shields, RPH-CPP [facility]

## 2022-12-24 ENCOUNTER — Encounter: Payer: Self-pay | Admitting: Internal Medicine

## 2022-12-24 ENCOUNTER — Ambulatory Visit (INDEPENDENT_AMBULATORY_CARE_PROVIDER_SITE_OTHER): Payer: Self-pay | Admitting: Internal Medicine

## 2022-12-24 VITALS — BP 126/84 | HR 95 | Ht 62.0 in | Wt 232.0 lb

## 2022-12-24 DIAGNOSIS — E1165 Type 2 diabetes mellitus with hyperglycemia: Secondary | ICD-10-CM

## 2022-12-24 DIAGNOSIS — Z794 Long term (current) use of insulin: Secondary | ICD-10-CM

## 2022-12-24 DIAGNOSIS — E890 Postprocedural hypothyroidism: Secondary | ICD-10-CM

## 2022-12-24 LAB — TSH: TSH: 0.22 u[IU]/mL — ABNORMAL LOW (ref 0.35–5.50)

## 2022-12-24 LAB — POCT GLYCOSYLATED HEMOGLOBIN (HGB A1C): Hemoglobin A1C: 7.2 % — AB (ref 4.0–5.6)

## 2022-12-24 LAB — POCT GLUCOSE (DEVICE FOR HOME USE): POC Glucose: 117 mg/dl — AB (ref 70–99)

## 2022-12-24 MED ORDER — LEVOTHYROXINE SODIUM 150 MCG PO TABS: 150.0000 ug | ORAL_TABLET | Freq: Every day | ORAL | 3 refills | Status: DC

## 2022-12-24 MED ORDER — METFORMIN HCL ER 750 MG PO TB24: 750.0000 mg | ORAL_TABLET | Freq: Every day | ORAL | 3 refills | Status: DC

## 2022-12-24 MED ORDER — "INSULIN SYRINGE 31G X 5/16"" 0.3 ML MISC": 1.0000 | Freq: Two times a day (BID) | 3 refills | Status: AC

## 2022-12-24 MED ORDER — INSULIN NPH (HUMAN) (ISOPHANE) 100 UNIT/ML ~~LOC~~ SUSP: SUBCUTANEOUS | 4 refills | Status: DC

## 2022-12-24 MED ORDER — GLIPIZIDE 10 MG PO TABS: ORAL_TABLET | ORAL | 3 refills | Status: DC

## 2022-12-24 NOTE — Patient Instructions (Signed)
-   Continue  Metformin 750 mg XR , 1 tablet with Dinner - Continue Glipizide 10 mg , 1 tablet before Breakfast and 1 tablet Before Supper  - Take ReliOn-N ( NPH) 10  units every morning and 12 units at bedtime      HOW TO TREAT LOW BLOOD SUGARS (Blood sugar LESS THAN 70 MG/DL) Please follow the RULE OF 15 for the treatment of hypoglycemia treatment (when your (blood sugars are less than 70 mg/dL)   STEP 1: Take 15 grams of carbohydrates when your blood sugar is low, which includes:  3-4 GLUCOSE TABS  OR 3-4 OZ OF JUICE OR REGULAR SODA OR ONE TUBE OF GLUCOSE GEL    STEP 2: RECHECK blood sugar in 15 MINUTES STEP 3: If your blood sugar is still low at the 15 minute recheck --> then, go back to STEP 1 and treat AGAIN with another 15 grams of carbohydrates.

## 2023-01-13 ENCOUNTER — Other Ambulatory Visit (HOSPITAL_COMMUNITY): Payer: Self-pay

## 2023-01-16 ENCOUNTER — Other Ambulatory Visit (HOSPITAL_COMMUNITY): Payer: Self-pay

## 2023-01-19 ENCOUNTER — Other Ambulatory Visit (HOSPITAL_COMMUNITY): Payer: Self-pay

## 2023-01-30 ENCOUNTER — Other Ambulatory Visit: Payer: Self-pay

## 2023-01-30 ENCOUNTER — Other Ambulatory Visit (HOSPITAL_COMMUNITY): Payer: Self-pay

## 2023-02-24 ENCOUNTER — Other Ambulatory Visit (HOSPITAL_COMMUNITY): Payer: Self-pay

## 2023-02-26 ENCOUNTER — Other Ambulatory Visit (HOSPITAL_COMMUNITY): Payer: Self-pay

## 2023-02-26 ENCOUNTER — Other Ambulatory Visit: Payer: Self-pay

## 2023-02-27 ENCOUNTER — Telehealth: Payer: Self-pay

## 2023-02-27 NOTE — Telephone Encounter (Signed)
[facility] Patient Advocate Encounter  Completed and sent [employer] Advancing Access application for Truvada for this patient who is uninsured.    Patient assistance phone number for follow up is [phone].   This encounter will be updated until final determination.,   Rakeem Colley, CPhT Specialty Pharmacy Patient Advocate [facility] Phone: [phone] Fax:  [phone]  

## 2023-03-03 ENCOUNTER — Other Ambulatory Visit (HOSPITAL_COMMUNITY): Payer: Self-pay

## 2023-03-03 ENCOUNTER — Telehealth: Payer: Self-pay

## 2023-03-03 NOTE — Telephone Encounter (Signed)
[facility] Patient Advocate Encounter  Completed and sent [employer] Advancing Access application for Trvada for this patient who is uninsured.    Patient is approved 03/03/23 through 03/02/24.  BIN      [id number] PCN    [id number] GRP    [id number] ID        [id number]   Clearance Coots, CPhT Specialty Pharmacy Patient [facility] Phone: [phone] Fax:  [phone]

## 2023-03-20 ENCOUNTER — Other Ambulatory Visit (HOSPITAL_COMMUNITY): Payer: Self-pay

## 2023-03-24 ENCOUNTER — Other Ambulatory Visit (HOSPITAL_COMMUNITY): Payer: Self-pay

## 2023-03-25 ENCOUNTER — Other Ambulatory Visit: Payer: Self-pay

## 2023-03-26 ENCOUNTER — Other Ambulatory Visit: Payer: Self-pay

## 2023-03-27 ENCOUNTER — Other Ambulatory Visit (HOSPITAL_COMMUNITY): Payer: Self-pay

## 2023-03-30 ENCOUNTER — Ambulatory Visit: Payer: Self-pay | Admitting: Pharmacist

## 2023-04-10 ENCOUNTER — Telehealth: Payer: Self-pay | Admitting: Pharmacist

## 2023-04-10 NOTE — Telephone Encounter (Signed)
Spoke with patient this morning who states she has a mandatory training for work that should be ending at the beginning of July. She did not want to reschedule her appointment to another day and then have to miss that again for her training. She states she will call when she is done with this training to schedule her appointment.  Margarite Gouge, PharmD, CPP, BCIDP, AAHIVP Clinical Pharmacist Practitioner Infectious Diseases Clinical Pharmacist [facility] for Infectious Disease

## 2023-04-14 ENCOUNTER — Other Ambulatory Visit: Payer: Self-pay | Admitting: Nurse Practitioner

## 2023-04-14 DIAGNOSIS — Z1231 Encounter for screening mammogram for malignant neoplasm of breast: Secondary | ICD-10-CM

## 2023-04-14 NOTE — Progress Notes (Signed)
Patient is interested in obtaining screening mammogram via mobile mammogram bus at [facility]. No concerns at this time. Last mammogram:2 years ago per patient.  Order placed.

## 2023-04-20 ENCOUNTER — Other Ambulatory Visit: Payer: Self-pay

## 2023-04-22 ENCOUNTER — Ambulatory Visit: Admission: RE | Admit: 2023-04-22 | Payer: Self-pay | Source: Ambulatory Visit

## 2023-05-18 ENCOUNTER — Other Ambulatory Visit: Payer: Self-pay | Admitting: Internal Medicine

## 2023-05-26 ENCOUNTER — Encounter: Payer: Self-pay | Admitting: Nurse Practitioner

## 2023-05-26 ENCOUNTER — Ambulatory Visit: Payer: Self-pay | Admitting: Nurse Practitioner

## 2023-05-26 VITALS — BP 137/90 | HR 75 | Ht 62.0 in | Wt 230.0 lb

## 2023-05-26 DIAGNOSIS — Z789 Other specified health status: Secondary | ICD-10-CM

## 2023-05-26 NOTE — Progress Notes (Signed)
Occupational [facility]- [facility]  Subjective:  Patient ID: Kylie Baker, female    DOB: Dec 23, 1975  Age: 47 y.o. MRN: [id number]  CC: Wellness Exam    HPI Kylie Baker presents for wellness exam visit for insurance benefit.  Patient has a PCP: Barry Brunner [facility] significant for: HTN, DM, HLD, on PrEP Last labs per PCP were completed: March 2024. Labs by PCP in Feb 2024  Health Maintenance:  Colonoscopy:Mar 06, 2022, repeat in 10 years.  Mammogram: Aug 2024 Pap: up to date    Smoker: Never  Immunizations:  Tdap: up to date- 2023 COVID- x3  Flu- yearly  Lifestyle: Diet- eats rice, veges.  Exercise- walks 30 min daily in the morning.      Past Medical History:  Diagnosis Date   Anemia    Asthma    Diabetes ([facility]) 07/11/2017   Gestational diabetes 01/25/2012   Hypertension    Palpitations    Pregnancy induced hypertension    Thyroid disease     Past Surgical History:  Procedure Laterality Date   APPENDECTOMY  1980's   CESAREAN SECTION  06/06/2010   CESAREAN SECTION  03/27/2012   Procedure: CESAREAN SECTION;  Surgeon: Kathreen Cosier, MD;  Location: [facility];  Service: Gynecology;  Laterality: N/A;  Repeat Cesarean Section Delivery   Boy  @ 0005 , Apgars 8/9   CHOLECYSTECTOMY  04/22/2012   Procedure: LAPAROSCOPIC CHOLECYSTECTOMY WITH INTRAOPERATIVE CHOLANGIOGRAM;  Surgeon: Mariella Saa, MD;  Location: [facility];  Service: General;  Laterality: N/A;   DILATION AND EVACUATION N/A 07/24/2017   Procedure: DILATATION AND EVACUATION;  Surgeon: Tilda Burrow, MD;  Location: [facility];  Service: Gynecology;  Laterality: N/A;   THYROIDECTOMY     UMBILICAL HERNIA REPAIR  09/07/2012   Procedure: HERNIA REPAIR UMBILICAL ADULT;  Surgeon: Adolph Pollack, MD;  Location: [facility];  Service: General;  Laterality: N/A;  Repair of Incisional Incarcarated hernia    Outpatient Medications Prior to Visit  Medication Sig Dispense Refill   amLODipine (NORVASC) 10 MG tablet  amlodipine 10 mg tablet  Take 1 tablet every day by oral route.     atenolol (TENORMIN) 25 MG tablet Take 1 tablet by mouth daily.     atorvastatin (LIPITOR) 40 MG tablet atorvastatin 40 mg tablet     cholecalciferol (VITAMIN D) 1000 units tablet Take 1,000 Units by mouth daily.     emtricitabine-tenofovir (TRUVADA) 200-300 MG tablet Take 1 tablet by mouth daily. 30 tablet 5   ferrous sulfate 325 (65 FE) MG tablet Take 1 tablet (325 mg total) by mouth daily. (Patient taking differently: Take 325 mg by mouth 3 (three) times daily.) 30 tablet 0   glipiZIDE (GLUCOTROL) 10 MG tablet 1 tablet before Breakfast and Before Supper 180 tablet 3   insulin NPH Human (NOVOLIN N RELION) 100 UNIT/ML injection INJECT 0.1 MLS (10 UNITS TOTAL) INTO THE SKIN DAILY BEFORE BREAKFAST AND 0.12 MLS (12 UNITS TOTAL) AT BEDTIME 20 mL 1   Insulin Syringe-Needle U-100 (INSULIN SYRINGE .3CC/31GX5/16") 31G X 5/16" 0.3 ML MISC 1 Device by Does not apply route in the morning and at bedtime. 200 each 3   levothyroxine (SYNTHROID) 150 MCG tablet Take 1 tablet (150 mcg total) by mouth daily. 90 tablet 3   lisinopril (PRINIVIL,ZESTRIL) 20 MG tablet lisinopril 20 mg tablet  TAKE 1 TABLET BY MOUTH DAILY     metFORMIN (GLUCOPHAGE-XR) 750 MG 24 hr tablet Take 1 tablet (750 mg total) by mouth daily with breakfast.  90 tablet 3   vitamin C (ASCORBIC ACID) 500 MG tablet Take 500 mg by mouth daily.     Norgestimate-Ethinyl Estradiol Triphasic 0.18/0.215/0.25 MG-35 MCG tablet Take 1 tablet by mouth daily. (Patient not taking: Reported on 05/26/2023)     No facility-administered medications prior to visit.    ROS Review of Systems  Constitutional:  Negative for fatigue and unexpected weight change.  HENT:  Negative for hearing loss.   Eyes:  Negative for visual disturbance.  Respiratory:  Negative for shortness of breath.   Cardiovascular:  Negative for chest pain and leg swelling.  Gastrointestinal:  Negative for constipation, diarrhea,  nausea and vomiting.  Musculoskeletal:  Negative for arthralgias and back pain.  Neurological:  Negative for dizziness and headaches.  Psychiatric/Behavioral:  Negative for sleep disturbance.     Objective:  BP (!) 137/90   Pulse 75   Ht 5\' 2"  (1.575 m)   Wt 230 lb (104.3 kg)   LMP 10/17/2017   SpO2 100%   BMI 42.07 kg/m   Physical Exam Constitutional:      General: She is not in acute distress. HENT:     Head: Normocephalic.     Right Ear: Tympanic membrane, ear canal and external ear normal. There is no impacted cerumen.     Left Ear: Tympanic membrane, ear canal and external ear normal. There is no impacted cerumen.     Nose: No congestion.  Eyes:     Pupils: Pupils are equal, round, and reactive to light.  Cardiovascular:     Rate and Rhythm: Normal rate and regular rhythm.     Heart sounds: Normal heart sounds.  Pulmonary:     Effort: Pulmonary effort is normal.  Abdominal:     General: Abdomen is flat. Bowel sounds are normal.     Palpations: Abdomen is soft.  Musculoskeletal:        General: Normal range of motion.  Skin:    General: Skin is warm.  Neurological:     General: No focal deficit present.     Mental Status: She is alert and oriented to person, place, and time.  Psychiatric:        Mood and Affect: Mood normal.        Behavior: Behavior normal.      Assessment & Plan:    Kylie Baker was seen today for wellness exam .  Diagnoses and all orders for this visit:  Participant in health and wellness plan   Adult wellness physical was conducted today. Importance of diet and exercise were discussed in detail.  We reviewed immunizations and gave recommendations regarding current immunization needed for age.  Preventative health exams needed: up to date  Patient was advised yearly wellness exam and follow-up with PCP as scheduled.   No orders of the defined types were placed in this encounter.   No orders of the defined types were placed in this  encounter.   Follow-up: as needed.

## 2023-05-28 ENCOUNTER — Other Ambulatory Visit (HOSPITAL_COMMUNITY): Payer: Self-pay

## 2023-05-28 ENCOUNTER — Telehealth: Payer: Self-pay

## 2023-05-28 NOTE — Telephone Encounter (Signed)
[facility] Patient Advocate Encounter  Received a fax from [facility] Advancing Access indicating that there has been no activity related to patient enrollment in the last 60 days.   (Prescription have not been filled)  Patient will be withdrawn from the program.    Clearance Coots, CPhT Specialty Pharmacy Patient [facility] for Infectious Disease Phone: [phone] Fax:  [phone]

## 2023-07-03 ENCOUNTER — Ambulatory Visit: Payer: Self-pay | Admitting: Internal Medicine

## 2023-09-30 ENCOUNTER — Encounter: Payer: Self-pay | Admitting: Internal Medicine

## 2024-01-01 ENCOUNTER — Other Ambulatory Visit: Payer: Self-pay | Admitting: Internal Medicine

## 2024-01-04 ENCOUNTER — Encounter: Payer: Self-pay | Admitting: Internal Medicine

## 2024-01-04 ENCOUNTER — Ambulatory Visit: Payer: PRIVATE HEALTH INSURANCE | Admitting: Internal Medicine

## 2024-01-04 ENCOUNTER — Telehealth: Payer: Self-pay

## 2024-01-04 VITALS — BP 122/74 | HR 71 | Ht 62.0 in | Wt 221.0 lb

## 2024-01-04 DIAGNOSIS — E1165 Type 2 diabetes mellitus with hyperglycemia: Secondary | ICD-10-CM

## 2024-01-04 DIAGNOSIS — Z794 Long term (current) use of insulin: Secondary | ICD-10-CM

## 2024-01-04 DIAGNOSIS — E890 Postprocedural hypothyroidism: Secondary | ICD-10-CM | POA: Diagnosis not present

## 2024-01-04 LAB — POCT GLUCOSE (DEVICE FOR HOME USE): Glucose Fasting, POC: 221 mg/dL — AB (ref 70–99)

## 2024-01-04 LAB — POCT GLYCOSYLATED HEMOGLOBIN (HGB A1C): Hemoglobin A1C: 7.8 % — AB (ref 4.0–5.6)

## 2024-01-04 MED ORDER — METFORMIN HCL ER 750 MG PO TB24: 750.0000 mg | ORAL_TABLET | Freq: Every day | ORAL | 3 refills | Status: AC

## 2024-01-04 MED ORDER — TIRZEPATIDE 5 MG/0.5ML ~~LOC~~ SOAJ: 5.0000 mg | SUBCUTANEOUS | 3 refills | Status: DC

## 2024-01-04 NOTE — Progress Notes (Unsigned)
 Name: Kylie Baker  MRN/ DOB: [id number], Mar 27, 1976   Age/ Sex: 48 y.o., female    PCP: [name], PA-C   Reason for Endocrinology Evaluation: Type 2 Diabetes Mellitus/ Hypothyridism     Date of Initial Endocrinology Visit: 02/19/2022    PATIENT IDENTIFIER: Kylie Baker is a 48 y.o. female with a past medical history of T2Dm, HTN, Dyslipidemia and Hypothyroidism. The patient presented for initial endocrinology clinic visit on 02/19/2022 for consultative assistance with her diabetes management.    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  She is on LT-4  replacement.  HerTSH has been fluctuating over the years.    SUBJECTIVE:   During the last visit (12/24/2023): A1c 7.2%     Today (01/04/24): Kylie Baker is here for a follow up on diabetes management.  She has NOT been to our clinic in 12 months. She has not been checking glucose    Denies diarrhea or diarrhea  Denies vomiting or nausea She is doing night shift 7 pm to 7 AM   HOME ENDOCRINE REGIMEN: Metformin 750 mg XR with supper Glipizide 10 mg BID  NPH 10 units QAM and 12 units QHS Levothyroxine 150 mcg, daily      Statin: Yes ACE-I/ARB: Yes    METER DOWNLOAD SUMMARY: did not bring     DIABETIC COMPLICATIONS: Microvascular complications:   Denies: neuropathy, retinopathy,  Last eye exam: Completed   Macrovascular complications:   Denies: CAD, PVD, CVA   PAST HISTORY: Past Medical History:  Past Medical History:  Diagnosis Date   Anemia     Asthma    Diabetes (HCC) 07/11/2017   Gestational diabetes 01/25/2012   Hypertension    Palpitations    Pregnancy induced hypertension    Thyroid disease    Past Surgical History:  Past Surgical History:  Procedure Laterality Date   APPENDECTOMY  1980's   CESAREAN SECTION  06/06/2010   CESAREAN SECTION  03/27/2012   Procedure: CESAREAN SECTION;  Surgeon: Kathreen Cosier, MD;  Location: [facility];  Service: Gynecology;  Laterality: N/A;  Repeat Cesarean Section Delivery   Boy  @ 0005 , Apgars 8/9   CHOLECYSTECTOMY  04/22/2012   Procedure: LAPAROSCOPIC CHOLECYSTECTOMY WITH INTRAOPERATIVE CHOLANGIOGRAM;  Surgeon: Mariella Saa, MD;  Location: [facility];  Service: General;  Laterality: N/A;   DILATION AND EVACUATION N/A 07/24/2017   Procedure: DILATATION AND EVACUATION;  Surgeon: Tilda Burrow, MD;  Location: [facility];  Service: Gynecology;  Laterality: N/A;   THYROIDECTOMY     UMBILICAL HERNIA REPAIR  09/07/2012   Procedure: HERNIA REPAIR UMBILICAL ADULT;  Surgeon: Adolph Pollack, MD;  Location: [facility];  Service: General;  Laterality: N/A;  Repair of Incisional Incarcarated hernia    Social History:  reports that she has never smoked. She has never used smokeless tobacco. She reports that she does not drink alcohol and does not use drugs. Family History:  Family History  Problem Relation Age of Onset   Hypertension  Mother    Diabetes Father    Asthma Father    Hypertension Father    Anesthesia problems Neg Hx    Colon cancer Neg Hx    Rectal cancer Neg Hx    Stomach cancer Neg Hx      HOME MEDICATIONS: Allergies as of 01/04/2024       Reactions   Aspirin Itching, Rash        Medication List        Accurate as of January 04, 2024 10:39 AM. If you have any questions, ask your nurse or doctor.          amLODipine 10 MG tablet Commonly known as: NORVASC amlodipine 10 mg tablet  Take 1 tablet every day by oral route.   ascorbic acid 500 MG tablet Commonly known  as: VITAMIN C Take 500 mg by mouth daily.   atenolol 25 MG tablet Commonly known as: TENORMIN Take 1 tablet by mouth daily.   atorvastatin 40 MG tablet Commonly known as: LIPITOR atorvastatin 40 mg tablet   cholecalciferol 1000 units tablet Commonly known as: VITAMIN D Take 1,000 Units by mouth daily.   ferrous sulfate 325 (65 FE) MG tablet Take 1 tablet (325 mg total) by mouth daily. What changed: when to take this   glipiZIDE 10 MG tablet Commonly known as: GLUCOTROL 1 tablet before Breakfast and Before Supper   INSULIN SYRINGE .3CC/31GX5/16" 31G X 5/16" 0.3 ML Misc 1 Device by Does not apply route in the morning and at bedtime.   levothyroxine 150 MCG tablet Commonly known as: SYNTHROID Take 1 tablet (150 mcg total) by mouth daily.   lisinopril 20 MG tablet Commonly known as: ZESTRIL lisinopril 20 mg tablet  TAKE 1 TABLET BY MOUTH DAILY   lisinopril 40 MG tablet Commonly known as: ZESTRIL Take 40 mg by mouth daily.   metFORMIN 750 MG 24 hr tablet Commonly known as: GLUCOPHAGE-XR Take 1 tablet (750 mg total) by mouth daily with breakfast.   Norgestimate-Ethinyl Estradiol Triphasic 0.18/0.215/0.25 MG-35 MCG tablet Take 1 tablet by mouth daily.   NovoLIN N ReliOn 100 UNIT/ML injection Generic drug: insulin NPH Human INJECT 0.1 MLS (10 UNITS TOTAL) INTO THE SKIN DAILY BEFORE BREAKFAST AND 0.12 MLS (12 UNITS TOTAL) AT BEDTIME   Truvada 200-300 MG tablet Generic drug: emtricitabine-tenofovir Take 1 tablet by mouth daily.         ALLERGIES: Allergies  Allergen Reactions   Aspirin Itching and Rash       OBJECTIVE:   VITAL SIGNS: BP 122/74 (BP Location: Left Arm, Patient Position: Sitting, Cuff Size: Small)   Pulse 71   Ht 5\' 2"  (1.575 m)   Wt 221 lb (100.2 kg)   LMP 10/17/2017   SpO2 99%   BMI 40.42 kg/m    PHYSICAL EXAM:  General: Pt appears well and is in NAD  Neck: General: Supple without adenopathy or carotid bruits. Thyroid: Thyroid  size normal.  No goiter or nodules appreciated.  Lungs: Clear with good BS bilat   Heart: RRR   Abdomen:  soft, nontender, without masses or organomegaly palpable  Extremities:  Lower extremities -Trace  pretibial edema.   Neuro: MS is good with appropriate affect, pt is alert and Ox3    DM foot exam: 12/24/2022  The skin of the feet is intact without sores or ulcerations. The pedal pulses are 2+ on right and 2+ on left. The sensation is intact to a screening 5.07, 10 gram monofilament bilaterally  DATA REVIEWED:  Lab Results  Component Value Date   HGBA1C 7.2 (A) 12/24/2022   HGBA1C 6.5 (A) 06/25/2022   HGBA1C 12.1 (A) 02/19/2022        In office BG 221 mg/dL     ASSESSMENT / PLAN / RECOMMENDATIONS:   1) Type 2 Diabetes Mellitus, Sub-Optimally  controlled, Without complications - Most recent A1c of 7.8%. Goal A1c < 7.0 %.     - Pt has been noted with hyperglycemia  -She is intolerant to BID dosing of metformin  - I have recommended GLP-1 agonist, caution against GI side effects.  Patient to discontinue glipizide -I will also increase her insulin as below, she was advised to check glucose as GLP-1 agonist could improve insulin sensitivity, hence may lead to hypoglycemia and we would need to monitor for hypoglycemia to decrease insulin  MEDICATIONS: -Stop glipizide -Start Mounjaro 2.5 mg weekly for 4 weeks, then increase to Mounjaro 5 mg weekly - Continue Metformin 750 mg XR , 1 tablet with Dinner - Take ReliOn-N ( NPH) 12 1380 units QAM and 12 units QHS  EDUCATION / INSTRUCTIONS: BG monitoring instructions: Patient is instructed to check her blood sugars 1 times a day, fasting . Call [facility] if: BG persistently < 70  I reviewed the Rule of 15 for the treatment of hypoglycemia in detail with the patient. Literature supplied.   2) Diabetic complications:  Eye: Does not  have known diabetic retinopathy.  Neuro/ Feet: Does not have known  diabetic peripheral neuropathy. Renal: Patient does not have known baseline CKD. She is  on an ACEI/ARB at present.    3) Postoperative Hypothyroidism:   - Pt is clinically euthyroid  - No local neck symptoms  -TSH ***   Medication   levothyroxine 150 mcg daily  Follow-up in 3 months  Signed electronically by: Lyndle Herrlich, MD  [facility] Endocrinology  [facility] [address] [place], [place] [address] Phone: [phone] FAX: [phone]   CC: Marya Landry [address] [place] [address] Phone: [phone]  Fax: [phone]    Return to Endocrinology clinic as below: Future Appointments  Date Time Provider Department Center  01/04/2024 10:50 AM Treasa Bradshaw, Konrad Dolores, MD [facility] None

## 2024-01-04 NOTE — Patient Instructions (Addendum)
-   STOP Glipizide  - Start Mounjaro 2.5 mg weekly for 4 weeks, than pick up the prescription 5 mg weekly - Continue  Metformin 750 mg XR , 1 tablet with Dinner - Take ReliOn-N ( NPH) 12  units every morning and 12 units at bedtime      HOW TO TREAT LOW BLOOD SUGARS (Blood sugar LESS THAN 70 MG/DL) Please follow the RULE OF 15 for the treatment of hypoglycemia treatment (when your (blood sugars are less than 70 mg/dL)   STEP 1: Take 15 grams of carbohydrates when your blood sugar is low, which includes:  3-4 GLUCOSE TABS  OR 3-4 OZ OF JUICE OR REGULAR SODA OR ONE TUBE OF GLUCOSE GEL    STEP 2: RECHECK blood sugar in 15 MINUTES STEP 3: If your blood sugar is still low at the 15 minute recheck --> then, go back to STEP 1 and treat AGAIN with another 15 grams of carbohydrates.

## 2024-01-04 NOTE — Telephone Encounter (Signed)
 Medication Samples have been provided to the patient.  Drug name: Greggory Keen       Strength: 2.5mg         Qty: 1 box   LOT: [id number]  Exp.Date: 05/12/2025  Dosing instructions: INJECT 2.5MG  ONCE WEEKLY   The patient has been instructed regarding the correct time, dose, and frequency of taking this medication, including desired effects and most common side effects.   Antiono Ettinger L Oliviya Gilkison 10:59 AM 01/04/2024

## 2024-01-05 ENCOUNTER — Encounter: Payer: Self-pay | Admitting: Internal Medicine

## 2024-01-05 LAB — BASIC METABOLIC PANEL WITH GFR
BUN: 13 mg/dL (ref 7–25)
CO2: 30 mmol/L (ref 20–32)
Calcium: 9.1 mg/dL (ref 8.6–10.2)
Chloride: 103 mmol/L (ref 98–110)
Creat: 0.62 mg/dL (ref 0.50–0.99)
Glucose, Bld: 226 mg/dL — ABNORMAL HIGH (ref 65–99)
Potassium: 3.9 mmol/L (ref 3.5–5.3)
Sodium: 142 mmol/L (ref 135–146)
eGFR: 110 mL/min/{1.73_m2} (ref >=60)

## 2024-01-05 LAB — MICROALBUMIN / CREATININE URINE RATIO
Creatinine, Urine: 98 mg/dL (ref 20–275)
Microalb Creat Ratio: 10 mg/g{creat} (ref <30)
Microalb, Ur: 1 mg/dL

## 2024-01-05 LAB — TSH: TSH: 0.52 m[IU]/L

## 2024-01-05 MED ORDER — LEVOTHYROXINE SODIUM 150 MCG PO TABS: 150.0000 ug | ORAL_TABLET | Freq: Every day | ORAL | 3 refills | Status: AC

## 2024-01-21 ENCOUNTER — Telehealth: Payer: Self-pay

## 2024-01-21 NOTE — Telephone Encounter (Signed)
 Pharmacy Patient Advocate Encounter   Received notification from CoverMyMeds that prior authorization for [facility] is required/requested.   Insurance verification completed.   The patient is insured through [employer] .   Per test claim: PA required; PA submitted to above mentioned insurance via CoverMyMeds Key/confirmation #/EOC [facility] Status is pending

## 2024-02-02 NOTE — Telephone Encounter (Signed)
 Pharmacy Patient Advocate Encounter  Received notification from [employer] that Prior Authorization for Mounjaro has been DENIED.  Full denial letter will be uploaded to the media tab. See denial reason below.  It does not meet medical necessity. Mounjaro has been requested for the treatment of type 2 diabetes. The requirement(s) not met are:   You must not use the requested medication with Dipeptidyl-peptidase-4 (DPP-4) inhibitor medications (such as, alogliptin, linagliptan, saxagliptan or sitagliptin). You must not use the requested medication with other glucagon-like peptide-1 agonist medications (GLP-1) agonists, such as Ozempic, Bydureon, Mounjaro, Rybelsus, Trulicity).  You must not have any FDA labeled contraindications to the requested agent. Therefore, coverage for Mounjaro is denied.   PA #/Case ID/Reference #: [facility]

## 2024-02-03 ENCOUNTER — Other Ambulatory Visit (HOSPITAL_COMMUNITY): Payer: Self-pay

## 2024-02-03 ENCOUNTER — Other Ambulatory Visit: Payer: Self-pay

## 2024-02-03 MED ORDER — TIRZEPATIDE 5 MG/0.5ML ~~LOC~~ SOAJ: 5.0000 mg | SUBCUTANEOUS | 3 refills | Status: DC

## 2024-04-07 ENCOUNTER — Encounter: Payer: Self-pay | Admitting: Internal Medicine

## 2024-04-07 ENCOUNTER — Ambulatory Visit: Payer: PRIVATE HEALTH INSURANCE | Admitting: Internal Medicine

## 2024-04-07 VITALS — BP 134/80 | HR 85 | Ht 62.0 in | Wt 215.0 lb

## 2024-04-07 DIAGNOSIS — E890 Postprocedural hypothyroidism: Secondary | ICD-10-CM

## 2024-04-07 DIAGNOSIS — E119 Type 2 diabetes mellitus without complications: Secondary | ICD-10-CM

## 2024-04-07 DIAGNOSIS — Z7984 Long term (current) use of oral hypoglycemic drugs: Secondary | ICD-10-CM

## 2024-04-07 LAB — POCT GLUCOSE (DEVICE FOR HOME USE): Glucose Fasting, POC: 101 mg/dL — AB (ref 70–99)

## 2024-04-07 LAB — POCT GLYCOSYLATED HEMOGLOBIN (HGB A1C): Hemoglobin A1C: 5.5 % (ref 4.0–5.6)

## 2024-04-07 NOTE — Patient Instructions (Signed)
-   Continue  Metformin  750 mg XR , 1 tablet with Dinner - Take ReliOn-N ( NPH) 8  units every morning and 8 units at bedtime      HOW TO TREAT LOW BLOOD SUGARS (Blood sugar LESS THAN 70 MG/DL) Please follow the RULE OF 15 for the treatment of hypoglycemia treatment (when your (blood sugars are less than 70 mg/dL)   STEP 1: Take 15 grams of carbohydrates when your blood sugar is low, which includes:  3-4 GLUCOSE TABS  OR 3-4 OZ OF JUICE OR REGULAR SODA OR ONE TUBE OF GLUCOSE GEL    STEP 2: RECHECK blood sugar in 15 MINUTES STEP 3: If your blood sugar is still low at the 15 minute recheck --> then, go back to STEP 1 and treat AGAIN with another 15 grams of carbohydrates.

## 2024-04-07 NOTE — Progress Notes (Signed)
 Name: Kylie Baker  MRN/ DOB: [id number], 1976-08-02   Age/ Sex: 48 y.o., female    PCP: Gabino Joe, PA-C   Reason for Endocrinology Evaluation: Type 2 Diabetes Mellitus/ Hypothyridism     Date of Initial Endocrinology Visit: 02/19/2022    PATIENT IDENTIFIER: Kylie Baker is a 48 y.o. female with a past medical history of T2Dm, HTN, Dyslipidemia and Hypothyroidism. The patient presented for initial endocrinology clinic visit on 02/19/2022 for consultative assistance with her diabetes management.    HPI: [name] was    Diagnosed with DM years ago  Prior Medications tried/Intolerance: intolerant to BID dosing of metformin            Hemoglobin A1c has ranged from 11.6% in 02/2021, peaking at 12.1% in 2023   On her initial visit to our clinic, her A1c was 12.1%, we switched metformin  to XR formulation, continue glipizide  and restart her on NPH  Stopped glipizide  and started Mounjaro  12/2023 with an A1c of 7.8%    THYROID  HISTORY: She has been diagnosed with hypothyroidism for many years, she is S/P total thyroidectomy due to benign reasons.  She is on LT-4  replacement.  HerTSH has been fluctuating over the years.    SUBJECTIVE:   During the last visit (01/04/2024): A1c 7.8%     Today (04/07/24): [name] is here for a follow up on diabetes management. She has been checking glucose occasionally. No hypoglycemia on meter but has symptoms of hypoglycemia, relieved by drinking juice.   Has constipation  Denies vomiting or nausea She is doing night shift 7 pm to 7 AM  No local neck symptoms  No palpitations   HOME ENDOCRINE REGIMEN: Metformin  750 mg XR with supper Mounjaro  5 mg weekly- not taking  NPH 10 units QAM and 10 units at bedtime-she is taking 10 units twice daily Levothyroxine  150 mcg, daily    Statin: Yes ACE-I/ARB: Yes    METER DOWNLOAD SUMMARY: did not bring     DIABETIC COMPLICATIONS: Microvascular complications:   Denies:  neuropathy, retinopathy,  Last eye exam: Completed   Macrovascular complications:   Denies: CAD, PVD, CVA   PAST HISTORY: Past Medical History:  Past Medical History:  Diagnosis Date   Anemia    Asthma    Diabetes (HCC) 07/11/2017   Gestational diabetes 01/25/2012   Hypertension    Palpitations    Pregnancy induced hypertension    Thyroid  disease    Past Surgical History:  Past Surgical History:  Procedure Laterality Date   APPENDECTOMY  1980's   CESAREAN SECTION  06/06/2010   CESAREAN SECTION  03/27/2012   Procedure: CESAREAN SECTION;  Surgeon: Heide Livings, MD;  Location: [facility];  Service: Gynecology;  Laterality: N/A;  Repeat Cesarean Section Delivery   Boy  @ 0005 , Apgars 8/9   CHOLECYSTECTOMY  04/22/2012   Procedure: LAPAROSCOPIC CHOLECYSTECTOMY WITH INTRAOPERATIVE CHOLANGIOGRAM;  Surgeon: Quitman Bucy, MD;  Location: [facility];  Service: General;  Laterality: N/A;   DILATION AND EVACUATION N/A 07/24/2017   Procedure: DILATATION AND EVACUATION;  Surgeon: Albino Hum, MD;  Location: [facility];  Service: Gynecology;  Laterality: N/A;   THYROIDECTOMY     UMBILICAL HERNIA REPAIR  09/07/2012   Procedure: HERNIA REPAIR UMBILICAL ADULT;  Surgeon: Harlee Lichtenstein, MD;  Location: [facility];  Service: General;  Laterality: N/A;  Repair of Incisional Incarcarated hernia    Social History:  reports that she has never smoked. She has never used smokeless tobacco.  She reports that she does not drink alcohol and does not use drugs. Family History:  Family History  Problem Relation Age of Onset   Hypertension Mother    Diabetes Father    Asthma Father    Hypertension Father    Anesthesia problems Neg Hx    Colon cancer Neg Hx    Rectal cancer Neg Hx    Stomach cancer Neg Hx      HOME MEDICATIONS: Allergies as of 04/07/2024       Reactions   Aspirin Itching, Rash        Medication List        Accurate as of April 07, 2024  7:54 AM. If you have any questions,  ask your nurse or doctor.          amLODipine  10 MG tablet Commonly known as: NORVASC  amlodipine  10 mg tablet  Take 1 tablet every day by oral route.   ascorbic acid 500 MG tablet Commonly known as: VITAMIN C Take 500 mg by mouth daily.   atenolol 25 MG tablet Commonly known as: TENORMIN Take 1 tablet by mouth daily.   atenolol 50 MG tablet Commonly known as: TENORMIN Take 1 tablet every day by oral route for 30 days.   atorvastatin 40 MG tablet Commonly known as: LIPITOR atorvastatin 40 mg tablet   cholecalciferol 1000 units tablet Commonly known as: VITAMIN D  Take 1,000 Units by mouth daily.   ferrous sulfate  325 (65 FE) MG tablet Take 1 tablet (325 mg total) by mouth daily. What changed: when to take this   INSULIN  SYRINGE .3CC/31GX5/16 31G X 5/16 0.3 ML Misc 1 Device by Does not apply route in the morning and at bedtime.   levothyroxine  150 MCG tablet Commonly known as: SYNTHROID  Take 1 tablet (150 mcg total) by mouth daily.   lisinopril  20 MG tablet Commonly known as: ZESTRIL  lisinopril  20 mg tablet  TAKE 1 TABLET BY MOUTH DAILY   lisinopril  40 MG tablet Commonly known as: ZESTRIL  Take 40 mg by mouth daily.   metFORMIN  750 MG 24 hr tablet Commonly known as: GLUCOPHAGE -XR Take 1 tablet (750 mg total) by mouth daily in the afternoon.   Norgestimate-Ethinyl Estradiol Triphasic 0.18/0.215/0.25 MG-35 MCG tablet Take 1 tablet by mouth daily.   NovoLIN N ReliOn 100 UNIT/ML injection Generic drug: insulin  NPH Human INJECT 0.1 MLS (10 UNITS TOTAL) INTO THE SKIN DAILY BEFORE BREAKFAST AND 0.12 MLS (12 UNITS TOTAL) AT BEDTIME   tirzepatide  5 MG/0.5ML Pen Commonly known as: MOUNJARO  Inject 5 mg into the skin once a week.   Truvada  200-300 MG tablet Generic drug: emtricitabine -tenofovir  Take 1 tablet by mouth daily.         ALLERGIES: Allergies  Allergen Reactions   Aspirin Itching and Rash       OBJECTIVE:   VITAL SIGNS: BP 134/80 (BP  Location: Right Arm, Patient Position: Sitting, Cuff Size: Normal)   Pulse 85   Ht 5' 2 (1.575 m)   Wt 215 lb (97.5 kg)   LMP 10/17/2017   SpO2 99%   BMI 39.32 kg/m    PHYSICAL EXAM:  General: Pt appears well and is in NAD  Neck: General: Supple without adenopathy or carotid bruits. Thyroid : Thyroid  size normal.  No goiter or nodules appreciated.  Lungs: Clear with good BS bilat   Heart: RRR   Abdomen:  soft, nontender  Extremities:  Lower extremities -no  pretibial edema.   Neuro: MS is good with appropriate affect, pt is  alert and Ox3    DM foot exam: 04/07/2024  The skin of the feet is intact without sores or ulcerations. The pedal pulses are 2+ on right and 2+ on left. The sensation is intact to a screening 5.07, 10 gram monofilament bilaterally   DATA REVIEWED:  Lab Results  Component Value Date   HGBA1C 5.5 04/07/2024   HGBA1C 7.8 (A) 01/04/2024   HGBA1C 7.2 (A) 12/24/2022      Latest Reference Range & Units 01/04/24 11:11  Sodium 135 - 146 mmol/L 142  Potassium 3.5 - 5.3 mmol/L 3.9  Chloride 98 - 110 mmol/L 103  CO2 20 - 32 mmol/L 30  Glucose 65 - 99 mg/dL 595 (H)  BUN 7 - 25 mg/dL 13  Creatinine 6.38 - 7.56 mg/dL 4.33  Calcium  8.6 - 10.2 mg/dL 9.1  BUN/Creatinine Ratio 6 - 22 (calc) SEE NOTE:  eGFR > OR = 60 mL/min/1.61m2 110    Latest Reference Range & Units 01/04/24 11:11  TSH mIU/L 0.52    Latest Reference Range & Units 01/04/24 11:11  Microalb, Ur mg/dL 1.0  MICROALB/CREAT RATIO <30 mg/g creat 10  Creatinine, Urine 20 - 275 mg/dL 98     ASSESSMENT / PLAN / RECOMMENDATIONS:   1) Type 2 Diabetes Mellitus, Optimally  controlled, Without complications - Most recent A1c of 5.5%. Goal A1c < 7.0 %.    - A1c has trended down from 7.8% to 5.5% -She is intolerant to BID dosing of metformin   - I have attempted to prescribe Mounjaro , but this was denied by her insurance, patient denied trying any other GLP-1 agonist at this time -Patient opted to  remain on insulin , she is using less insulin  than previously prescribed, given low A1c and symptoms of hypoglycemia I will decrease her insulin  further -I have encouraged her to continue with lifestyle changes, I did inform the patient that if she increases her carbohydrate intake she will need to increase her insulin   MEDICATIONS:  - Continue Metformin  750 mg XR , 1 tablet with Dinner - Take ReliOn-N ( NPH) 8 units QAM and 8 units QHS  EDUCATION / INSTRUCTIONS: BG monitoring instructions: Patient is instructed to check her blood sugars 1 times a day, fasting . Call [facility] if: BG persistently < 70  I reviewed the Rule of 15 for the treatment of hypoglycemia in detail with the patient. Literature supplied.   2) Diabetic complications:  Eye: Does not  have known diabetic retinopathy.  Neuro/ Feet: Does not have known diabetic peripheral neuropathy. Renal: Patient does not have known baseline CKD. She is  on an ACEI/ARB at present.    3) Postoperative Hypothyroidism:   - Pt is clinically euthyroid  - No local neck symptoms  -TSH  is normal    Medication   Continue levothyroxine  150 mcg daily  Follow-up in 4 months  Signed electronically by: Natale Bail, MD  [facility] Endocrinology  [facility] [address] [place] [place], [place] [address] Phone: [phone] FAX: [phone]   CC: Sherwood Donath [address] [place] [address] Phone: [phone]  Fax: [phone]    Return to Endocrinology clinic as below: Future Appointments  Date Time Provider Department Center  08/11/2024  8:10 AM Bexlee Bergdoll, Julian Obey, MD [facility] None

## 2024-06-01 ENCOUNTER — Encounter: Payer: Self-pay | Admitting: Internal Medicine

## 2024-06-02 MED ORDER — TIRZEPATIDE 2.5 MG/0.5ML ~~LOC~~ SOAJ
2.5000 mg | SUBCUTANEOUS | 1 refills | Status: DC
Start: 2024-06-02 — End: 2024-07-14

## 2024-06-13 ENCOUNTER — Telehealth: Payer: Self-pay

## 2024-06-13 ENCOUNTER — Other Ambulatory Visit (HOSPITAL_COMMUNITY): Payer: Self-pay

## 2024-06-13 NOTE — Telephone Encounter (Signed)
 Pharmacy Patient Advocate Encounter   Received notification from CoverMyMeds that prior authorization for Mounjaro  2.5MG /0.5ML auto-injectors is required/requested.   Insurance verification completed.   The patient is insured through [employer] .   Per test claim: PA required; PA submitted to above mentioned insurance via Latent Key/confirmation #/EOC [id number] Status is pending

## 2024-06-14 NOTE — Telephone Encounter (Signed)
 Any update on this PA?

## 2024-06-15 NOTE — Telephone Encounter (Signed)
 Pharmacy Patient Advocate Encounter  Received notification from [facility] [employer] that Prior Authorization for Mounjaro  2.5mg /0.49ml has been APPROVED from 06/14/24 to 06/14/25   PA #/Case ID/Reference #: [id number]

## 2024-06-28 ENCOUNTER — Ambulatory Visit: Payer: Self-pay | Admitting: Nurse Practitioner

## 2024-07-08 ENCOUNTER — Encounter (HOSPITAL_COMMUNITY): Payer: Self-pay

## 2024-07-08 ENCOUNTER — Emergency Department (HOSPITAL_COMMUNITY)
Admission: EM | Admit: 2024-07-08 | Discharge: 2024-07-09 | Disposition: A | Discharge status: Home / Self Care | Payer: Self-pay | Attending: Emergency Medicine | Admitting: Emergency Medicine

## 2024-07-08 ENCOUNTER — Other Ambulatory Visit: Payer: Self-pay

## 2024-07-08 ENCOUNTER — Emergency Department (HOSPITAL_COMMUNITY): Payer: Self-pay

## 2024-07-08 DIAGNOSIS — Z794 Long term (current) use of insulin: Secondary | ICD-10-CM | POA: Insufficient documentation

## 2024-07-08 DIAGNOSIS — R0789 Other chest pain: Secondary | ICD-10-CM | POA: Diagnosis present

## 2024-07-08 DIAGNOSIS — Z79899 Other long term (current) drug therapy: Secondary | ICD-10-CM | POA: Insufficient documentation

## 2024-07-08 DIAGNOSIS — I10 Essential (primary) hypertension: Secondary | ICD-10-CM | POA: Insufficient documentation

## 2024-07-08 DIAGNOSIS — E119 Type 2 diabetes mellitus without complications: Secondary | ICD-10-CM | POA: Insufficient documentation

## 2024-07-08 LAB — BASIC METABOLIC PANEL WITH GFR
Anion gap: 12 (ref 5–15)
BUN: 16 mg/dL (ref 6–20)
CO2: 24 mmol/L (ref 22–32)
Calcium: 9.1 mg/dL (ref 8.9–10.3)
Chloride: 107 mmol/L (ref 98–111)
Creatinine, Ser: 0.56 mg/dL (ref 0.44–1.00)
GFR, Estimated: >60 mL/min (ref >60)
Glucose, Bld: 139 mg/dL — ABNORMAL HIGH (ref 70–99)
Potassium: 3.9 mmol/L (ref 3.5–5.1)
Sodium: 143 mmol/L (ref 135–145)

## 2024-07-08 LAB — CBC
HCT: 40.7 % (ref 36.0–46.0)
Hemoglobin: 11.5 g/dL — ABNORMAL LOW (ref 12.0–15.0)
MCH: 21.3 pg — ABNORMAL LOW (ref 26.0–34.0)
MCHC: 28.3 g/dL — ABNORMAL LOW (ref 30.0–36.0)
MCV: 75.5 fL — ABNORMAL LOW (ref 80.0–100.0)
Platelets: 137 K/uL — ABNORMAL LOW (ref 150–400)
RBC: 5.39 MIL/uL — ABNORMAL HIGH (ref 3.87–5.11)
RDW: 14.3 % (ref 11.5–15.5)
WBC: 5.4 K/uL (ref 4.0–10.5)
nRBC: 0 % (ref 0.0–0.2)

## 2024-07-08 LAB — TROPONIN T, HIGH SENSITIVITY: Troponin T High Sensitivity: <15 ng/L (ref 0–19)

## 2024-07-08 MED ORDER — FENTANYL CITRATE PF 50 MCG/ML IJ SOSY
25.0000 ug | PREFILLED_SYRINGE | Freq: Once | INTRAMUSCULAR | Status: AC
  Administered 2024-07-08: 25 ug via INTRAVENOUS
  Filled 2024-07-08: qty 1

## 2024-07-08 NOTE — ED Provider Notes (Signed)
 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  Has not taken any medication at home to help with symptoms.  No palpitations, shortness of breath, near syncopal symptoms.  Patient has a history of hypertension, type 2 diabetes, anemia.  No exertional component to her pain, aggravated with movement of the left arm and chest wall, no alleviating factors identified.   HPI     Prior to Admission medications   Medication Sig Start Date End Date Taking? Authorizing Provider  amLODipine  (NORVASC ) 10 MG tablet amlodipine  10 mg tablet  Take 1 tablet every day by oral route.    [provider]  atenolol (TENORMIN) 25 MG tablet Take 1 tablet by mouth daily. Patient not taking: Reported on 04/07/2024    [provider]  atenolol (TENORMIN) 50 MG tablet Take 1 tablet every day by oral route for 30 days. 01/12/24   [provider]  atorvastatin (LIPITOR) 40 MG tablet atorvastatin 40 mg tablet    [provider]  cholecalciferol (VITAMIN D ) 1000 units tablet Take 1,000 Units by mouth daily.    [provider]  emtricitabine -tenofovir  (TRUVADA ) 200-300 MG tablet Take 1 tablet by mouth daily. 10/06/22   Kuppelweiser, Cassie L, RPH-CPP  ferrous sulfate  325 (65 FE) MG tablet Take 1 tablet (325 mg total) by mouth daily. Patient taking differently: Take 325 mg by mouth 3 (three) times daily. 09/26/16   Mabe, Glendale CROME, MD  insulin  NPH Human (NOVOLIN N RELION) 100 UNIT/ML injection INJECT 0.1 MLS (10 UNITS TOTAL) INTO THE SKIN DAILY BEFORE BREAKFAST AND 0.12 MLS (12 UNITS TOTAL) AT BEDTIME 05/18/23   Shamleffer, Donell Cardinal, MD  Insulin   Syringe-Needle U-100 (INSULIN  SYRINGE .3CC/31GX5/16) 31G X 5/16 0.3 ML MISC 1 Device by Does not apply route in the morning and at bedtime. 12/24/22   Shamleffer, Ibtehal Jaralla, MD  levothyroxine  (SYNTHROID ) 150 MCG tablet Take 1 tablet (150 mcg total) by mouth daily. 01/05/24   Shamleffer, Ibtehal Jaralla, MD  lisinopril  (PRINIVIL ,ZESTRIL ) 20 MG tablet lisinopril  20 mg tablet  TAKE 1 TABLET BY MOUTH DAILY    [provider]  lisinopril  (ZESTRIL ) 40 MG tablet Take 40 mg by mouth daily.    [provider]  metFORMIN  (GLUCOPHAGE -XR) 750 MG 24 hr tablet Take 1 tablet (750 mg total) by mouth daily in the afternoon. 01/04/24   Shamleffer, Ibtehal Jaralla, MD  Norgestimate-Ethinyl Estradiol Triphasic 0.18/0.215/0.25 MG-35 MCG tablet Take 1 tablet by mouth daily.    [provider]  tirzepatide  (MOUNJARO ) 2.5 MG/0.5ML Pen Inject 2.5 mg into the skin once a week. 06/02/24   Shamleffer, Ibtehal Jaralla, MD  vitamin C (ASCORBIC ACID) 500 MG tablet Take 500 mg by mouth daily.    [provider]    Allergies: Aspirin    Review of Systems  Constitutional: Negative.   HENT: Negative.    Respiratory: Negative.    Cardiovascular:  Positive for chest pain. Negative for palpitations and leg swelling.  Gastrointestinal: Negative.     Updated Vital Signs BP (!) 157/96   Pulse 65   Temp 98 F (36.7 C)   Resp 17  LMP 10/17/2017   SpO2 100%   Physical Exam Vitals and nursing note reviewed. Exam conducted with a chaperone present.  Constitutional:      Appearance: She is obese. She is not ill-appearing or toxic-appearing.  HENT:     Head: Normocephalic and atraumatic.     Mouth/Throat:     Mouth: Mucous membranes are moist.     Pharynx: No oropharyngeal exudate or posterior oropharyngeal erythema.  Eyes:     General:        Right eye: No discharge.        Left eye: No discharge.     Conjunctiva/sclera: Conjunctivae normal.  Cardiovascular:     Rate and Rhythm:  Normal rate and regular rhythm.     Pulses: Normal pulses.     Heart sounds: Normal heart sounds. No murmur heard. Pulmonary:     Effort: Pulmonary effort is normal. No respiratory distress.     Breath sounds: Normal breath sounds. No wheezing or rales.  Chest:     Chest wall: Tenderness present. No mass or edema.  Breasts:    Right: Normal.     Left: Normal.       Comments: Left chest wall pain exhibited with external rotation of the upper arm. Abdominal:     General: Bowel sounds are normal. There is no distension.     Palpations: Abdomen is soft.     Tenderness: There is no abdominal tenderness.  Musculoskeletal:        General: No deformity.     Cervical back: Neck supple.     Right lower leg: No edema.     Left lower leg: No edema.  Lymphadenopathy:     Upper Body:     Right upper body: No supraclavicular or axillary adenopathy.     Left upper body: No supraclavicular or axillary adenopathy.  Skin:    General: Skin is warm and dry.     Capillary Refill: Capillary refill takes less than 2 seconds.  Neurological:     General: No focal deficit present.     Mental Status: She is alert and oriented to person, place, and time. Mental status is at baseline.  Psychiatric:        Mood and Affect: Mood normal.     (all labs ordered are listed, but only abnormal results are displayed) Labs Reviewed  BASIC METABOLIC PANEL WITH GFR - Abnormal; Notable for the following components:      Result Value   Glucose, Bld 139 (*)    All other components within normal limits  CBC - Abnormal; Notable for the following components:   RBC 5.39 (*)    Hemoglobin 11.5 (*)    MCV 75.5 (*)    MCH 21.3 (*)    MCHC 28.3 (*)    Platelets 137 (*)    All other components within normal limits  TROPONIN T, HIGH SENSITIVITY    EKG: None  Radiology: DG Chest 2 View Result Date: 07/08/2024 CLINICAL DATA:  Left arm and back pain, chest achiness EXAM: CHEST - 2 VIEW COMPARISON:  12/12/2018  FINDINGS: Frontal and lateral views of the chest demonstrate a stable cardiac silhouette. No airspace disease, effusion, or pneumothorax. No acute bony abnormalities. IMPRESSION: 1. No acute intrathoracic process. Electronically Signed   By: Ozell Daring M.D.   On: 07/08/2024 17:58     Procedures   Medications Ordered in the ED  fentaNYL  (SUBLIMAZE ) injection 25 mcg (25 mcg Intravenous Given 07/08/24 2336)  Medical Decision Making 48 year old with left-sided chest wall pain.  Hypertensive on intake and vital signs as normal.  Cardiopulmonary and abdominal exams are benign.  Chest wall exam as above.  Symmetric strength and sensation upper extremities bilaterally.  DDx includes but limited to musculoskeletal injury, ACS, PE, pleural effusion, pneumonia, pneumothorax.  Patient is PERC negative.  Amount and/or Complexity of Data Reviewed Labs: ordered.    Details: CBC with mild anemia with hemoglobin 11.5.  BMP unremarkable, troponin is negative.   Radiology: ordered.    Details: Chest x-ray negative for acute cardiopulmonary disease   ECG/medicine tests:     Details: EKG has normal sinus rhythm without ischemic changes.  Risk Prescription drug management.    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  Pain started day after this shift.  Clinical picture most consistent with musculoskeletal pain.  Will recommend supportive care, no Lidoderm  patch offered due to location of pain beneath the breast on the left.  Heat or ice application as needed for comfort, close outpatient follow-up recommended.  Clinical concern for emergent underlying condition that would warrant further ED workup and patient management is exceedingly low. Cale voiced understanding of her medical evaluation and treatment plan. Each of their questions answered to their expressed satisfaction.  Return precautions were given.   Patient is well-appearing, stable, and was discharged in good condition.  This chart was dictated using voice recognition software, Dragon. Despite the best efforts of this provider to proofread and correct errors, errors may still occur which can change documentation meaning.      Final diagnoses:  None    ED Discharge Orders     None          Bobette Pleasant SAUNDERS, PA-C 07/09/24 0202    Elnor Jayson LABOR, DO 07/13/24 [phone]

## 2024-07-08 NOTE — ED Triage Notes (Signed)
 Pt came in for left arm pain, back pain, and aching in her chest that started before yesterday.

## 2024-07-09 NOTE — Discharge Instructions (Signed)
 You were seen in the emergency department today for your chest wall pain.  Your physical exam and vital signs are very reassuring.  The muscles in your chest wall are appear to be strained.  This can be quite painful.  To help with your pain you may take Tylenol  and / or NSAID medication (such as ibuprofen  or naproxen ) to help with your pain.   You may also utilize topical pain relief such as Biofreeze, IcyHot, or topical lidocaine  patches.  I also recommend that you apply heat to the area, such as a hot shower or heating pad, and follow heat application with massage of the muscles that are most tight.  Please return to the emergency department if you develop any numbness/tingling/weakness in your arms or legs, any difficulty urinating, or urinary incontinence chest pain, shortness of breath, abdominal pain, nausea or vomiting that does not stop, or any other new severe symptoms.

## 2024-07-12 ENCOUNTER — Encounter: Payer: Self-pay | Admitting: Nurse Practitioner

## 2024-07-12 ENCOUNTER — Ambulatory Visit: Payer: Self-pay | Admitting: Nurse Practitioner

## 2024-07-12 VITALS — BP 142/90 | HR 82 | Temp 96.8°F | Ht 63.0 in | Wt 201.0 lb

## 2024-07-12 DIAGNOSIS — Z789 Other specified health status: Secondary | ICD-10-CM

## 2024-07-12 NOTE — Progress Notes (Signed)
 Occupational Health- [facility]  Subjective:  Patient ID: Kylie Baker, female    DOB: Jul 31, 1976  Age: 48 y.o. MRN: [id number]  CC: Wellness exam   HPI Kylie Baker presents for wellness exam visit for insurance benefit.  Patient has a PCP: [facility] significant for: HLD, HTN Last labs per PCP were completed: 2025  Health Maintenance:  Colonoscopy: 02/2022, repeat in 10 years Mammogram: 2025 Pap: up to date.     Smoker:never  Immunizations:  Flu- yeary Tdap: 2023    Lifestyle: Diet-no particular diet Exercise- none      Past Medical History:  Diagnosis Date   Anemia    Asthma    Diabetes (HCC) 07/11/2017   Gestational diabetes 01/25/2012   Hypertension    Palpitations    Pregnancy induced hypertension    Thyroid  disease     Past Surgical History:  Procedure Laterality Date   APPENDECTOMY  1980's   CESAREAN SECTION  06/06/2010   CESAREAN SECTION  03/27/2012   Procedure: CESAREAN SECTION;  Surgeon: Aida DELENA Na, MD;  Location: [facility];  Service: Gynecology;  Laterality: N/A;  Repeat Cesarean Section Delivery   Boy  @ 0005 , Apgars 8/9   CHOLECYSTECTOMY  04/22/2012   Procedure: LAPAROSCOPIC CHOLECYSTECTOMY WITH INTRAOPERATIVE CHOLANGIOGRAM;  Surgeon: Morene ONEIDA Olives, MD;  Location: [facility];  Service: General;  Laterality: N/A;   DILATION AND EVACUATION N/A 07/24/2017   Procedure: DILATATION AND EVACUATION;  Surgeon: Edsel Norleen GAILS, MD;  Location: [facility];  Service: Gynecology;  Laterality: N/A;   THYROIDECTOMY     UMBILICAL HERNIA REPAIR  09/07/2012   Procedure: HERNIA REPAIR UMBILICAL ADULT;  Surgeon: Krystal JINNY Russell, MD;  Location: [facility];  Service: General;  Laterality: N/A;  Repair of Incisional Incarcarated hernia    Outpatient Medications Prior to Visit  Medication Sig Dispense Refill   amLODipine  (NORVASC ) 10 MG tablet amlodipine  10 mg tablet  Take 1 tablet every day by oral route.     atenolol (TENORMIN) 50 MG tablet Take 50 mg by mouth  daily.     atorvastatin (LIPITOR) 40 MG tablet atorvastatin 40 mg tablet     cholecalciferol (VITAMIN D ) 1000 units tablet Take 1,000 Units by mouth daily.     ferrous sulfate  325 (65 FE) MG tablet Take 1 tablet (325 mg total) by mouth daily. 30 tablet 0   insulin  NPH Human (NOVOLIN N RELION) 100 UNIT/ML injection INJECT 0.1 MLS (10 UNITS TOTAL) INTO THE SKIN DAILY BEFORE BREAKFAST AND 0.12 MLS (12 UNITS TOTAL) AT BEDTIME 20 mL 1   levothyroxine  (SYNTHROID ) 150 MCG tablet Take 1 tablet (150 mcg total) by mouth daily. 90 tablet 3   lisinopril  (ZESTRIL ) 40 MG tablet Take 40 mg by mouth daily.     metFORMIN  (GLUCOPHAGE -XR) 750 MG 24 hr tablet Take 1 tablet (750 mg total) by mouth daily in the afternoon. 90 tablet 3   tirzepatide  (MOUNJARO ) 2.5 MG/0.5ML Pen Inject 2.5 mg into the skin once a week. 6 mL 1   vitamin C (ASCORBIC ACID) 500 MG tablet Take 500 mg by mouth daily.     emtricitabine -tenofovir  (TRUVADA ) 200-300 MG tablet Take 1 tablet by mouth daily. (Patient not taking: Reported on 07/12/2024) 30 tablet 5   Insulin  Syringe-Needle U-100 (INSULIN  SYRINGE .3CC/31GX5/16) 31G X 5/16 0.3 ML MISC 1 Device by Does not apply route in the morning and at bedtime. 200 each 3   lisinopril  (PRINIVIL ,ZESTRIL ) 20 MG tablet lisinopril  20 mg tablet  TAKE 1 TABLET  BY MOUTH DAILY (Patient not taking: Reported on 07/12/2024)     atenolol (TENORMIN) 50 MG tablet Take 1 tablet every day by oral route for 30 days.     Norgestimate-Ethinyl Estradiol Triphasic 0.18/0.215/0.25 MG-35 MCG tablet Take 1 tablet by mouth daily.     No facility-administered medications prior to visit.    ROS Review of Systems  HENT:  Negative for hearing loss.   Eyes:  Negative for visual disturbance.  Cardiovascular:  Negative for chest pain and leg swelling.  Gastrointestinal:  Positive for constipation. Negative for diarrhea, nausea and vomiting.  Musculoskeletal:  Negative for arthralgias and back pain.  Neurological:  Negative  for dizziness and headaches.  Psychiatric/Behavioral:  Negative for sleep disturbance.     Objective:  BP (!) 142/90 (BP Location: Right Arm, Patient Position: Sitting, Cuff Size: Normal)   Pulse 82   Temp (!) 96.8 F (36 C) (Temporal)   Ht 5' 3 (1.6 m)   Wt 201 lb (91.2 kg)   LMP 10/17/2017   SpO2 100%   BMI 35.61 kg/m   Physical Exam Constitutional:      General: She is not in acute distress. HENT:     Head: Normocephalic.     Right Ear: Tympanic membrane, ear canal and external ear normal. There is no impacted cerumen.     Left Ear: Tympanic membrane, ear canal and external ear normal. There is no impacted cerumen.     Nose: No congestion.     Mouth/Throat:     Mouth: Mucous membranes are moist.     Pharynx: No oropharyngeal exudate or posterior oropharyngeal erythema.  Eyes:     Pupils: Pupils are equal, round, and reactive to light.  Cardiovascular:     Rate and Rhythm: Normal rate and regular rhythm.     Heart sounds: Normal heart sounds.  Pulmonary:     Effort: Pulmonary effort is normal.     Breath sounds: Normal breath sounds.  Abdominal:     General: Bowel sounds are normal.     Palpations: Abdomen is soft.  Musculoskeletal:        General: Normal range of motion.     Right lower leg: No edema.     Left lower leg: No edema.  Neurological:     General: No focal deficit present.     Mental Status: She is alert and oriented to person, place, and time.  Psychiatric:        Mood and Affect: Mood normal.        Behavior: Behavior normal.      Assessment & Plan:    Kylie Baker was seen today for wellness exam.  Diagnoses and all orders for this visit:  Participant in health and wellness plan Adult wellness physical was conducted today. Importance of diet and exercise were discussed in detail.  We reviewed immunizations and gave recommendations regarding current immunization needed for age.  Preventative health exams are up to date.   Patient was advised  yearly wellness exam and follow-up with PCP as scheduled.     No orders of the defined types were placed in this encounter.   No orders of the defined types were placed in this encounter.   Follow-up: as needed,

## 2024-07-14 ENCOUNTER — Other Ambulatory Visit: Payer: Self-pay | Admitting: Internal Medicine

## 2024-07-14 ENCOUNTER — Other Ambulatory Visit (HOSPITAL_COMMUNITY): Payer: Self-pay

## 2024-07-14 ENCOUNTER — Telehealth: Payer: Self-pay

## 2024-07-14 MED ORDER — TIRZEPATIDE 5 MG/0.5ML ~~LOC~~ SOAJ: 5.0000 mg | SUBCUTANEOUS | 1 refills | Status: DC

## 2024-07-14 NOTE — Telephone Encounter (Signed)
 Mounjaro needs PA

## 2024-07-14 NOTE — Telephone Encounter (Signed)
 It looks like her insurance only covers 2.5mg /0.58ml dose once every 6 months. They want her to go up to the 5mg  dose. Please advise if this is acceptable.

## 2024-07-14 NOTE — Telephone Encounter (Signed)
 Pharmacy Patient Advocate Encounter   Received notification from Pt Calls Messages that prior authorization for Mounjaro  5mg /0.44ml is required/requested.   Insurance verification completed.   The patient is insured through [employer] .   Per test claim: PA required; PA submitted to above mentioned insurance via Latent Key/confirmation #/EOC [facility] Status is pending

## 2024-07-18 NOTE — Telephone Encounter (Signed)
 Pharmacy Patient Advocate Encounter  Received notification from [facility] [employer] that Prior Authorization for Mounjaro  5mg /0.19ml has been APPROVED from 06/14/24 to 06/14/25   PA #/Case ID/Reference #: [id number]

## 2024-08-11 ENCOUNTER — Ambulatory Visit: Payer: Self-pay | Admitting: Internal Medicine

## 2024-09-21 ENCOUNTER — Telehealth: Payer: Self-pay

## 2024-09-21 ENCOUNTER — Encounter: Payer: Self-pay | Admitting: Internal Medicine

## 2024-09-21 ENCOUNTER — Other Ambulatory Visit (HOSPITAL_COMMUNITY): Payer: Self-pay

## 2024-09-21 MED ORDER — TIRZEPATIDE 7.5 MG/0.5ML ~~LOC~~ SOAJ: 7.5000 mg | SUBCUTANEOUS | 3 refills | Status: AC

## 2024-09-21 NOTE — Telephone Encounter (Signed)
 Pharmacy Patient Advocate Encounter   Received notification from CoverMyMeds that prior authorization for Mounjaro  7.5MG /0.5ML auto-injectors is required/requested.   Insurance verification completed.   The patient is insured through [employer].   Per test claim: PA required; PA submitted to above mentioned insurance via Latent Key/confirmation #/EOC [id number] Status is pending

## 2024-09-26 NOTE — Telephone Encounter (Signed)
 Pharmacy Patient Advocate Encounter  Received notification from [facility] [employer] that Prior Authorization for Mounjaro  7.5MG /0.5ML auto-injectors  has been APPROVED from 09/21/2024 to 06/14/2025
